# Patient Record
Sex: Female | Born: 1940 | Race: White | Hispanic: No | Smoking: Never smoker
Health system: Southern US, Community
[De-identification: ages and names within clinical notes are randomized; demographics above are authoritative.]

## PROBLEM LIST (undated history)

## (undated) DIAGNOSIS — E785 Hyperlipidemia, unspecified: Secondary | ICD-10-CM

## (undated) DIAGNOSIS — I10 Essential (primary) hypertension: Secondary | ICD-10-CM

## (undated) DIAGNOSIS — I252 Old myocardial infarction: Secondary | ICD-10-CM

## (undated) DIAGNOSIS — C9110 Chronic lymphocytic leukemia of B-cell type not having achieved remission: Secondary | ICD-10-CM

## (undated) DIAGNOSIS — M109 Gout, unspecified: Secondary | ICD-10-CM

## (undated) DIAGNOSIS — M1990 Unspecified osteoarthritis, unspecified site: Secondary | ICD-10-CM

## (undated) DIAGNOSIS — K219 Gastro-esophageal reflux disease without esophagitis: Secondary | ICD-10-CM

## (undated) DIAGNOSIS — R0600 Dyspnea, unspecified: Secondary | ICD-10-CM

## (undated) DIAGNOSIS — F419 Anxiety disorder, unspecified: Secondary | ICD-10-CM

## (undated) DIAGNOSIS — C8590 Non-Hodgkin lymphoma, unspecified, unspecified site: Secondary | ICD-10-CM

## (undated) DIAGNOSIS — T7840XA Allergy, unspecified, initial encounter: Secondary | ICD-10-CM

## (undated) HISTORY — DX: Gastro-esophageal reflux disease without esophagitis: K21.9

## (undated) HISTORY — DX: Old myocardial infarction: I25.2

## (undated) HISTORY — DX: Allergy, unspecified, initial encounter: T78.40XA

## (undated) HISTORY — DX: Essential (primary) hypertension: I10

## (undated) HISTORY — DX: Hyperlipidemia, unspecified: E78.5

## (undated) HISTORY — PX: MOHS SURGERY: SHX181

## (undated) HISTORY — PX: ABDOMINAL HYSTERECTOMY: SHX81

## (undated) HISTORY — PX: APPENDECTOMY: SHX54

## (undated) HISTORY — DX: Gout, unspecified: M10.9

## (undated) HISTORY — DX: Non-Hodgkin lymphoma, unspecified, unspecified site: C85.90

## (undated) HISTORY — DX: Chronic lymphocytic leukemia of B-cell type not having achieved remission: C91.10

## (undated) HISTORY — PX: OTHER SURGICAL HISTORY: SHX169

## (undated) HISTORY — PX: TONSILECTOMY, ADENOIDECTOMY, BILATERAL MYRINGOTOMY AND TUBES: SHX2538

## (undated) HISTORY — PX: KIDNEY SURGERY: SHX687

## (undated) HISTORY — PX: CHOLECYSTECTOMY: SHX55

---

## 2019-06-04 ENCOUNTER — Ambulatory Visit: Payer: Self-pay | Admitting: Internal Medicine

## 2019-10-02 NOTE — Progress Notes (Signed)
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 No treatment pursued at that time.  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  4) 06/14/15-received R-Benda, but d/c due to poor tolerance 5) 05/06/18-06/26/2018: ibrutinib therapy, d/c due to poor tolerance 6) 09/02/2019: last visit with Dr. Phillip Heal at the [facility] clinic in [place], [place].  6) 10/03/2019: establish care with Dr. 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 female with medical history significant for CLL who presents as a referral from [facility] in [place], [place].   On review of the previous records Ms. [name] has a history of breast cancer diagnosed in November 2005.  She had a mastectomy performed with adjuvant AC chemotherapy administered.  She continued on Arimidex x5 years and completed that adjuvant therapy in April 2011.  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  No treatment was pursued at that time.  In September 2015 the patient had a transition of the diagnosis to CLL with a follicular component.  In 2017 the patient began experiencing B symptoms and increasing size of lymph nodes.  In April 2017 she attempted to receive R. Benda, but had to discontinue after 1 cycle due to poor tolerance.   Later in March 2020 the patient was started on ibrutinib therapy but once again this was discontinued due to poor tolerance.  She was recently seen by Dr. Phillip Heal at the [facility] hematology oncology clinic in [place] on 09/02/2019.  After moving to [facility] the patient was referred to hematology and oncology here at the [facility] health cancer center for further evaluation and management.  On exam today Mabe is accompanied by her daughter.  She reports that she has recently moved from [place] to [place].  She was previously cared for by an oncology group in [place], and then when she moved here she had her care transferred to a cancer center in [place].  Due to difficulties with the commute she has requested to establish with a local oncologist.  On discussion today she reports that she has chronic pain in her hips from osteoarthritis and that she has symptoms of chronic fatigue.  She is not able to walk very long distances and can typically only transfer from room to room within the house.  She notes that she is not having any issues with fevers, chills, sweats, nausea, vomiting or diarrhea.  Her weight has been stable.  She reports that she might have decreased appetite, but is otherwise quite well.  A full 10 point ROS is listed below.  MEDICAL HISTORY:  Past Medical History:  Diagnosis Date  . CLL (chronic lymphocytic leukemia) ([place])   . Gout   . HLD (hyperlipidemia)     SURGICAL HISTORY: History reviewed. No pertinent surgical history.  SOCIAL HISTORY: Social History   Socioeconomic History  . Marital  status: Single    Spouse name: Not on file  . Number of children: Not on file  . Years of education: Not on file  . Highest education level: Not on file  Occupational History  . Not on file  Tobacco Use  . Smoking status: Never Smoker  . Smokeless tobacco: Never Used  Substance and Sexual Activity  . Alcohol use: Not Currently    Comment: Rare social drinking  . Drug  use: Never  . Sexual activity: Not on file  Other Topics Concern  . Not on file  Social History Narrative  . Not on file   Social Determinants of Health   Financial Resource Strain:   . Difficulty of Paying Living Expenses:   Food Insecurity:   . Worried About Charity fundraiser in the Last Year:   . Arboriculturist in the Last Year:   Transportation Needs:   . Film/video editor (Medical):   Marland Kitchen Lack of Transportation (Non-Medical):   Physical Activity:   . Days of Exercise per Week:   . Minutes of Exercise per Session:   Stress:   . Feeling of Stress :   Social Connections:   . Frequency of Communication with Friends and Family:   . Frequency of Social Gatherings with Friends and Family:   . Attends Religious Services:   . Active Member of Clubs or Organizations:   . Attends Archivist Meetings:   Marland Kitchen Marital Status:   Intimate Partner Violence:   . Fear of Current or Ex-Partner:   . Emotionally Abused:   Marland Kitchen Physically Abused:   . Sexually Abused:     FAMILY HISTORY: Family History  Problem Relation Age of Onset  . Colon cancer Mother   . Heart attack Father     ALLERGIES:  is allergic to amoxicillin, drug [tape], atenolol, and macrodantin [nitrofurantoin].  MEDICATIONS:  Current Outpatient Medications  Medication Sig Dispense Refill  . acetaminophen (TYLENOL) 500 MG tablet Take 500 mg by mouth every 6 (six) hours as needed.    Marland Kitchen allopurinol (ZYLOPRIM) 100 MG tablet Take 100 mg by mouth daily. 1/2 tablet daily    . busPIRone (BUSPAR) 15 MG tablet Take 15 mg by mouth 2 (two) times daily.    . Cranberry Extract 250 MG TABS Take 500 mg by mouth.    . ezetimibe (ZETIA) 10 MG tablet Take 10 mg by mouth daily.    Marland Kitchen glucosamine-chondroitin 500-400 MG tablet Take 1 tablet by mouth daily.    Marland Kitchen guaifenesin (HUMIBID E) 400 MG TABS tablet Take 400 mg by mouth every 4 (four) hours.    . hydrochlorothiazide (MICROZIDE) 12.5 MG capsule Take 12.5 mg by mouth daily.     Marland Kitchen ibuprofen (ADVIL) 200 MG tablet Take 400 mg by mouth 3 (three) times daily.    . lansoprazole (PREVACID) 30 MG capsule Take 30 mg by mouth daily at 12 noon.    . loperamide (IMODIUM) 2 MG capsule Take by mouth as needed for diarrhea or loose stools.    Marland Kitchen loratadine (CLARITIN) 10 MG tablet Take 10 mg by mouth daily as needed for allergies.    Marland Kitchen LORazepam (ATIVAN) 1 MG tablet Take 1 mg by mouth 2 (two) times daily.    Marland Kitchen losartan (COZAAR) 50 MG tablet Take 50 mg by mouth daily.    . Melatonin 5 MG CHEW Chew by mouth.    . metoprolol tartrate (LOPRESSOR) 25 MG tablet Take 25 mg by mouth  2 (two) times daily.    . Multiple Vitamins-Minerals (EQ MULTIVITAMINS ADULT GUMMY) CHEW Chew by mouth.    . phenazopyridine (PYRIDIUM) 95 MG tablet Take 95 mg by mouth 3 (three) times daily as needed for pain.    . potassium chloride (KLOR-CON) 10 MEQ tablet Take 10 mEq by mouth daily.    Marland Kitchen venlafaxine XR (EFFEXOR-XR) 150 MG 24 hr capsule Take 150 mg by mouth daily with breakfast.     No current facility-administered medications for this visit.    REVIEW OF SYSTEMS:   Constitutional: ( - ) fevers, ( - )  chills , ( - ) night sweats Eyes: ( - ) blurriness of vision, ( - ) double vision, ( - ) watery eyes Ears, nose, mouth, throat, and face: ( - ) mucositis, ( - ) sore throat Respiratory: ( - ) cough, ( - ) dyspnea, ( - ) wheezes Cardiovascular: ( - ) palpitation, ( - ) chest discomfort, ( - ) lower extremity swelling Gastrointestinal:  ( - ) nausea, ( - ) heartburn, ( - ) change in bowel habits Skin: ( - ) abnormal skin rashes Lymphatics: ( - ) new lymphadenopathy, ( - ) easy bruising Neurological: ( - ) numbness, ( - ) tingling, ( - ) new weaknesses Behavioral/Psych: ( - ) mood change, ( - ) new changes  All other systems were reviewed with the patient and are negative.  PHYSICAL EXAMINATION: ECOG PERFORMANCE STATUS: 3 - Symptomatic, >50% confined to bed  Vitals:   10/03/19 0919 10/03/19 0920  BP:  (!) 80/53 (!) 80/57  Pulse: 62   Resp: 17   Temp: (!) 97.1 F (36.2 C)   SpO2: 100%    Filed Weights   10/03/19 0919  Weight: 169 lb 12.8 oz (77 kg)    GENERAL: chronically ill appearing elderly Caucasian female in NAD  SKIN: skin color, texture, turgor are normal, no rashes or significant lesions EYES: conjunctiva are pink and non-injected, sclera clear LUNGS: clear to auscultation and percussion with normal breathing effort HEART: regular rate & rhythm and no murmurs and no lower extremity edema ABDOMEN: soft, non-tender, non-distended, normal bowel sounds Musculoskeletal: no cyanosis of digits and no clubbing  PSYCH: alert & oriented x 3, fluent speech NEURO: no focal motor/sensory deficits  LABORATORY DATA:  I have reviewed the data as listed CBC Latest Ref Rng & Units 10/03/2019  WBC 4.0 - 10.5 K/uL 25.6(H)  Hemoglobin 12.0 - 15.0 g/dL 10.5(L)  Hematocrit 36 - 46 % 33.1(L)  Platelets 150 - 400 K/uL 200    CMP Latest Ref Rng & Units 10/03/2019  Glucose 70 - 99 mg/dL 119(H)  BUN 8 - 23 mg/dL 44(H)  Creatinine 0.44 - 1.00 mg/dL 1.71(H)  Sodium 135 - 145 mmol/L 138  Potassium 3.5 - 5.1 mmol/L 3.4(L)  Chloride 98 - 111 mmol/L 108  CO2 22 - 32 mmol/L 18(L)  Calcium 8.9 - 10.3 mg/dL 9.5  Total Protein 6.5 - 8.1 g/dL 5.6(L)  Total Bilirubin 0.3 - 1.2 mg/dL 0.5  Alkaline Phos 38 - 126 U/L 159(H)  AST 15 - 41 U/L 20  ALT 0 - 44 U/L 12     PATHOLOGY:      RADIOGRAPHIC STUDIES: No results found.  ASSESSMENT & PLAN Laurine Kuyper 79 y.o. female with medical history significant for CLL who presents as a referral from [facility] in [place], [place].  Review the outside records, review the labs, discussion with the patient the findings are  most consistent with CLL.  The stage at this time is not entirely clear, though the patient was previously attempted on therapy x2 with R-Benda and which she is not able to tolerate and ibruitinib which she is a was only able  to take for approximately 6 weeks before she began having symptoms that require discontinuation.  At this time the patient notes that she wants to transfer care as she recently moved from [facility].  As such we are happy to become her new primary oncologist and monitor her CLL.  We are in the process of obtaining additional records from [facility] in order to help assure that we know the full history of her CLL including staging and full work-up.  We do have flow cytometry results that help [place] confirm the diagnosis at this time.  Overall it appears as though she has been on observation with brief attempts at therapy before in the past.  Assuming her CLL is stable we will plan to see her back in approximately 3 months time in order to reevaluate.  #Chronic Lymphocytic Leukemia, Staging in Process --based on prior labs and studies the patient has a confirmed diagnosis of CLL. The stage is not clear based on the documentation. Will clarify this by obtaining prior records from [place]. --will order CBC, CMP, LDH for baseline here at [facility] --patient has attempted prior therapy with ibrutinib and R-Benda but not been able to tolerate thearpy.  --no record of lymphadenopathy or splenomegaly --RTC in 3 months or sooner if treatment is indicated/more evaluation is required.   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 --continue yearly mammograms   Orders Placed This Encounter  Procedures  . CBC with Differential (Cancer Center Only)    Standing Status:   Future    Number of Occurrences:   1    Standing Expiration Date:   10/02/2020  . CMP ([place] only)    Standing Status:   Future    Number of Occurrences:   1    Standing Expiration Date:   10/02/2020  . Lactate dehydrogenase (LDH)    Standing Status:   Future    Number of Occurrences:   1      Standing Expiration Date:   10/02/2020  . Save Smear (SSMR)    Standing Status:   Future    Number of Occurrences:   1    Standing Expiration Date:   10/02/2020  . QIG  (Quant. immunoglobulins  - IgG, IgA, IgM)    Standing Status:   Future    Number of Occurrences:   1    Standing Expiration Date:   10/02/2020  . Uric acid    Standing Status:   Future    Number of Occurrences:   1    Standing Expiration Date:   10/02/2020  . Ambulatory referral to Internal Medicine    Referral Priority:   Routine    Referral Type:   Consultation    Referral Reason:   Specialty Services Required    Requested Specialty:   Internal Medicine    Number of Visits Requested:   1    All questions were answered. The patient knows to call the clinic with any problems, questions or concerns.  A total of more than 60 minutes were spent on this encounter and over half of that time was spent on  counseling and coordination of care as outlined above.   Ledell Peoples, MD Department of Hematology/Oncology [place] [facility] Phone: [phone] Pager: [phone] Email: [email or web address]  10/03/2019 1:35 PM

## 2019-10-03 ENCOUNTER — Other Ambulatory Visit: Payer: Self-pay

## 2019-10-03 ENCOUNTER — Inpatient Hospital Stay (HOSPITAL_BASED_OUTPATIENT_CLINIC_OR_DEPARTMENT_OTHER): Payer: Medicare Other | Admitting: Hematology and Oncology

## 2019-10-03 ENCOUNTER — Encounter: Payer: Self-pay | Admitting: Hematology and Oncology

## 2019-10-03 ENCOUNTER — Inpatient Hospital Stay: Payer: Medicare Other | Attending: Hematology and Oncology

## 2019-10-03 VITALS — BP 80/57 | HR 62 | Temp 97.1°F | Resp 17 | Ht 63.0 in | Wt 169.8 lb

## 2019-10-03 DIAGNOSIS — Z856 Personal history of leukemia: Secondary | ICD-10-CM | POA: Diagnosis present

## 2019-10-03 DIAGNOSIS — Z0000 Encounter for general adult medical examination without abnormal findings: Secondary | ICD-10-CM | POA: Diagnosis not present

## 2019-10-03 DIAGNOSIS — C9110 Chronic lymphocytic leukemia of B-cell type not having achieved remission: Secondary | ICD-10-CM | POA: Diagnosis not present

## 2019-10-03 DIAGNOSIS — Z170 Estrogen receptor positive status [ER+]: Secondary | ICD-10-CM | POA: Diagnosis not present

## 2019-10-03 DIAGNOSIS — Z853 Personal history of malignant neoplasm of breast: Secondary | ICD-10-CM | POA: Diagnosis not present

## 2019-10-03 LAB — CBC WITH DIFFERENTIAL (CANCER CENTER ONLY)
Abs Immature Granulocytes: 0.13 10*3/uL — ABNORMAL HIGH (ref 0.00–0.07)
Basophils Absolute: 0.1 10*3/uL (ref 0.0–0.1)
Basophils Relative: 0 %
Eosinophils Absolute: 0.4 10*3/uL (ref 0.0–0.5)
Eosinophils Relative: 2 %
HCT: 33.1 % — ABNORMAL LOW (ref 36.0–46.0)
Hemoglobin: 10.5 g/dL — ABNORMAL LOW (ref 12.0–15.0)
Immature Granulocytes: 1 %
Lymphocytes Relative: 90 %
Lymphs Abs: 23.3 10*3/uL — ABNORMAL HIGH (ref 0.7–4.0)
MCH: 33.3 pg (ref 26.0–34.0)
MCHC: 31.7 g/dL (ref 30.0–36.0)
MCV: 105.1 fL — ABNORMAL HIGH (ref 80.0–100.0)
Monocytes Absolute: 0.4 10*3/uL (ref 0.1–1.0)
Monocytes Relative: 2 %
Neutro Abs: 1.2 10*3/uL — ABNORMAL LOW (ref 1.7–7.7)
Neutrophils Relative %: 5 %
Platelet Count: 200 10*3/uL (ref 150–400)
RBC: 3.15 MIL/uL — ABNORMAL LOW (ref 3.87–5.11)
RDW: 14.9 % (ref 11.5–15.5)
WBC Count: 25.6 10*3/uL — ABNORMAL HIGH (ref 4.0–10.5)
nRBC: 0 % (ref 0.0–0.2)

## 2019-10-03 LAB — CMP (CANCER CENTER ONLY)
ALT: 12 U/L (ref 0–44)
AST: 20 U/L (ref 15–41)
Albumin: 3.1 g/dL — ABNORMAL LOW (ref 3.5–5.0)
Alkaline Phosphatase: 159 U/L — ABNORMAL HIGH (ref 38–126)
Anion gap: 12 (ref 5–15)
BUN: 44 mg/dL — ABNORMAL HIGH (ref 8–23)
CO2: 18 mmol/L — ABNORMAL LOW (ref 22–32)
Calcium: 9.5 mg/dL (ref 8.9–10.3)
Chloride: 108 mmol/L (ref 98–111)
Creatinine: 1.71 mg/dL — ABNORMAL HIGH (ref 0.44–1.00)
GFR, Est AFR Am: 32 mL/min — ABNORMAL LOW (ref >60)
GFR, Estimated: 28 mL/min — ABNORMAL LOW (ref >60)
Glucose, Bld: 119 mg/dL — ABNORMAL HIGH (ref 70–99)
Potassium: 3.4 mmol/L — ABNORMAL LOW (ref 3.5–5.1)
Sodium: 138 mmol/L (ref 135–145)
Total Bilirubin: 0.5 mg/dL (ref 0.3–1.2)
Total Protein: 5.6 g/dL — ABNORMAL LOW (ref 6.5–8.1)

## 2019-10-03 LAB — SAVE SMEAR(SSMR), FOR PROVIDER SLIDE REVIEW

## 2019-10-03 LAB — LACTATE DEHYDROGENASE: LDH: 199 U/L — ABNORMAL HIGH (ref 98–192)

## 2019-10-03 LAB — URIC ACID: Uric Acid, Serum: 7 mg/dL (ref 2.5–7.1)

## 2019-10-04 LAB — IGG, IGA, IGM
IgA: <5 mg/dL — ABNORMAL LOW (ref 64–422)
IgG (Immunoglobin G), Serum: <30 mg/dL — ABNORMAL LOW (ref 586–1602)
IgM (Immunoglobulin M), Srm: <5 mg/dL — ABNORMAL LOW (ref 26–217)

## 2019-10-06 ENCOUNTER — Telehealth: Payer: Self-pay | Admitting: Hematology and Oncology

## 2019-10-06 NOTE — Telephone Encounter (Signed)
Scheduled per los. Called and spoke with patients daughter. Confirmed appt  °

## 2019-10-10 ENCOUNTER — Telehealth: Payer: Self-pay | Admitting: Internal Medicine

## 2019-10-10 NOTE — Telephone Encounter (Signed)
Called daughter, [name], to schedule the Palliative Consult - no answer, message left with reason for call along with my name and contact number.

## 2019-10-21 ENCOUNTER — Other Ambulatory Visit: Payer: Self-pay

## 2019-10-21 ENCOUNTER — Other Ambulatory Visit: Payer: Medicare Other | Admitting: Internal Medicine

## 2019-10-21 DIAGNOSIS — Z515 Encounter for palliative care: Secondary | ICD-10-CM

## 2019-10-21 DIAGNOSIS — C9110 Chronic lymphocytic leukemia of B-cell type not having achieved remission: Secondary | ICD-10-CM

## 2019-10-21 DIAGNOSIS — R531 Weakness: Secondary | ICD-10-CM

## 2019-10-22 ENCOUNTER — Encounter: Payer: Self-pay | Admitting: Internal Medicine

## 2019-10-22 NOTE — Progress Notes (Signed)
[place] Consult Note Telephone: [phone]  Fax: [phone]  PATIENT NAME: Sarah [name] DOB: 09-01-1940 MRN: [id number]  PRIMARY CARE PROVIDER:   Phillips Climes, MD  REFERRING PROVIDER:  Phillips Climes, MD [address] [place],  [place] [address]  RESPONSIBLE PARTY:   Self  Emergency contact/POA:   Mardene Sayer Daughter   [phone]        RECOMMENDATIONS and PLAN: Palliative care encounter  Z51.5   1.  Advance care planning:  Reviewed palliative and hospice care services.  Goals of care per patient are to improve strength, ambulation and stabilize her overall health.  She would like to avoid hospitalizations.  She is still interested in receiving therapies to aid in improving strength and CLL when recommended.  Advanced directives were reviewed with pt. and her daughter.  MOST form was reviewed and additional selections in addition to DNAR that was previously selected, will be re-addressed during the next visit.  DNAR document was uploaded to [facility]. Palliative care will follow-up with patient in aprox 3 weeks.  2.  Weakness:  Multifactorial and requires assistance with personal care.  Consider initiation of home physical therapy, home health aid to improve conditioning and mobility/stamina.  Daughter will investigate private caregiver providers.   3.  Chronic back and leg pain:  Discontinue use of Advil or any NSAIDs due to chronic kidney disease(per lab review).  Tylenol 1000mg  every 8 hours prn pain.  Trial of Gabapentin 100mg  at bedtime(home supply).   4.  Chronic lymphocytic leukemia:  Keep f/u management appointment with oncologist( Dr. Flora Lipps).  Establish with new PCP in October (Dr. Abelino Derrick).   5. Urinary incontinence: Decrease oral hydration after 7 pm.  Consider use of Pure wick collection system during sleep hours.  Rx for same left with patient who will investigate with insurance carrier.    I spent 90 minutes providing this consultation,  from 1330 to 1500. More than 50% of the time in this consultation was spent coordinating communication with patient and her daughter   HISTORY OF PRESENT ILLNESS:  Brad Mcgaughy is a 79 y.o. 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  Pt reports significant weakness with inability to perform her own ADLs.  She has relocated to [place] at her daughter's home from [place], [place] due to need for assistance with her personal and healthcare.  She reports that she spends much of her day and night in a recliner, requires a walker to assist with ambulation and is incontinent of urine.  She is unable to have treatments for CLL at this time.  Reports significant low back pain with radiation into R hip.  Pain ranges from a 4-8/10 and is managed with use of Advil 400mg  and Tylenol 500-1000mg  3 times per day.  Pertinent lab reviews from 10/03/19 noteWBC of 25.6,  BUN/CREA of 44/1.7.  GFR is decreased at 26. No noted mention of CKD but pt "had surgery on her kidney after childbirth".  Reports frequent urinary incontinence that requires use of adult briefs.  Daughter is her primary caregiver and has health issues of her own. Palliative Care was asked to help address goals of care.   CODE STATUS: DNR  PPS: 40%  weak HOSPICE ELIGIBILITY/DIAGNOSIS: TBD  PAST MEDICAL HISTORY:  Past Medical History:  Diagnosis Date  . CLL (chronic lymphocytic leukemia) ([place])   . Gout   . HLD  (hyperlipidemia)     SOCIAL HX: Lives with daughter and son in law   ALLERGIES:  Allergies  Allergen Reactions  . Amoxicillin   . Drug [Tape]   . Atenolol Rash  . Macrodantin [Nitrofurantoin] Rash     PERTINENT MEDICATIONS:  Outpatient Encounter Medications as of 10/21/2019  Medication Sig  . acetaminophen (TYLENOL) 500 MG tablet Take 500 mg by mouth every 6 (six) hours as needed.  Marland Kitchen allopurinol (ZYLOPRIM) 100 MG tablet Take 100 mg by mouth daily. 1/2 tablet daily  . busPIRone (BUSPAR) 15 MG tablet Take 15 mg by mouth 2 (two) times daily.  . Cranberry Extract 250 MG TABS Take 500 mg by mouth.  . ezetimibe (ZETIA) 10 MG tablet Take 10 mg by mouth daily.  Marland Kitchen glucosamine-chondroitin 500-400 MG tablet Take 1 tablet by mouth daily.  Marland Kitchen guaifenesin (HUMIBID E) 400 MG TABS tablet Take 400 mg by mouth every 4 (four) hours.  . hydrochlorothiazide (MICROZIDE) 12.5 MG capsule Take 12.5 mg by mouth daily.  Marland Kitchen ibuprofen (ADVIL) 200 MG tablet Take 400 mg by mouth 3 (three) times daily.  . lansoprazole (PREVACID) 30 MG capsule Take 30 mg by mouth daily at 12 noon.  . loperamide (IMODIUM) 2 MG capsule Take by mouth as needed for diarrhea or loose stools.  Marland Kitchen loratadine (CLARITIN) 10 MG tablet Take 10 mg by mouth daily as needed for allergies.  Marland Kitchen LORazepam (ATIVAN) 1 MG tablet Take 1 mg by mouth 2 (two) times daily.  Marland Kitchen losartan (COZAAR) 50 MG tablet Take 50 mg by mouth daily.  . Melatonin 5 MG CHEW Chew by mouth.  . metoprolol tartrate (LOPRESSOR) 25 MG tablet Take 25 mg by mouth 2 (two) times daily.  . Multiple Vitamins-Minerals (EQ MULTIVITAMINS ADULT GUMMY) CHEW Chew by mouth.  . phenazopyridine (PYRIDIUM) 95 MG tablet Take 95 mg by mouth 3 (three) times daily as needed for pain.  . potassium chloride (KLOR-CON) 10 MEQ tablet Take 10 mEq by mouth daily.  Marland Kitchen venlafaxine XR (EFFEXOR-XR) 150 MG 24 hr capsule Take 150 mg by mouth daily with breakfast.   No facility-administered encounter medications on  file as of 10/21/2019.    PHYSICAL EXAM:   General: NAD, chronically ill appearing elderly female reclining in a recliner Cardiovascular: regular rate and rhythm Pulmonary: clear ant fields Abdomen: soft, nontender, + bowel sounds GU: no suprapubic tenderness Extremities: no edema, decreased muscle tone Skin: exposed skin is intact Neurological: A&O x3 weakness, speech is fluid.  Rollator available for use with ambulation.  Gonzella Lex, NP-C

## 2019-10-23 ENCOUNTER — Telehealth: Payer: Self-pay | Admitting: Internal Medicine

## 2019-10-23 DIAGNOSIS — R531 Weakness: Secondary | ICD-10-CM

## 2019-10-23 DIAGNOSIS — Z515 Encounter for palliative care: Secondary | ICD-10-CM

## 2019-10-23 NOTE — Telephone Encounter (Signed)
See attached telephone document.  Gonzella Lex, NP-C

## 2019-11-06 ENCOUNTER — Ambulatory Visit: Payer: Medicare Other | Admitting: Family Medicine

## 2019-11-21 ENCOUNTER — Other Ambulatory Visit: Payer: Medicare Other | Admitting: Internal Medicine

## 2019-11-21 DIAGNOSIS — Z515 Encounter for palliative care: Secondary | ICD-10-CM

## 2019-11-21 DIAGNOSIS — M79604 Pain in right leg: Secondary | ICD-10-CM

## 2019-11-23 NOTE — Progress Notes (Signed)
[facility] Consult Note Telephone: [phone]  Fax: [phone]  PATIENT NAME: Sarah [name] DOB: 1940/11/05 MRN: [id number]  PRIMARY CARE PROVIDER:   Phillips Climes, MD  REFERRING PROVIDER:  Phillips Climes, MD [address] [place],  [place] [address]  RESPONSIBLE PARTY:   Self  Emergency contact/POA:   Mardene Sayer Daughter   [phone]        RECOMMENDATIONS and PLAN: Palliative care encounter  Z51.5   1.  Advance care planning:  DNAR on file  Goals remain for improvement of strength and ambulation.   She would like to avoid hospitalizations. . Palliative care will follow-up with patient in aprox 4-6 weeks.  2.  Weakness: Improved since discontinuance of HCTZ and Losartan.  Pt will begin self directed exercises/Tai Chi at this time.  Re-consider home PT if additional assistance is required.  Daughter will investigate home care benefits and private caregivers.    3.  Chronic back and leg pain: Unchanged after use of Gabapentin x2 weeks and has resumed use of Ibuprofen 868m in divided doses  Again cautioned against use of NSAIDs.  Gabapentin could be increased to 2086mat HS Tylenol 100076mvery 8 hours prn pain.  Monitor renal function with repeat comprehnesive metabolic panel.  4.  Chronic lymphocytic leukemia:  Keep pending appointment with oncologist( Dr. JohFlora Lipps Establish with new PCP in October (Dr. WilAbelino Derrick  5. Urinary incontinence: Improved since d/c of HCTZ.  Continue to limit evening hydration.   I spent 40 minutes providing this consultation,  from 1130 to 1210. More than 50% of the time in this consultation was spent coordinating communication with patient and her daughter   HISTORY OF PRESENT ILLNESS:  Follow-up with Sarah [name] [age]o. 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 She  reports feeling stronger and less dizzy since HCTZ and Losartan have been discontinued.  No reports of falls. She also reports improved sleep with use of Gabapentin however, she was still awakened due to leg pain. Improved pain with use of modified doses of Ibuprofen and Tylenol.  Palliative Care was asked to help address goals of care.   CODE STATUS: DNR  PPS: 40%  weak HOSPICE ELIGIBILITY/DIAGNOSIS: TBD  PAST MEDICAL HISTORY:  Past Medical History:  Diagnosis Date  . CLL (chronic lymphocytic leukemia) ([facility])   . Gout   . HLD (hyperlipidemia)     SOCIAL HX: Lives with daughter and son in law   ALLERGIES:  Allergies  Allergen Reactions  . Amoxicillin   . Drug [Tape]   . Atenolol Rash  . Macrodantin [Nitrofurantoin] Rash     PERTINENT MEDICATIONS:  Outpatient Encounter Medications as of 10/21/2019  Medication Sig  . acetaminophen (TYLENOL) 500 MG tablet Take 500 mg by mouth every 6 (six) hours as needed.  Marland Kitchen allopurinol (ZYLOPRIM) 100 MG tablet Take 100 mg by mouth daily. 1/2 tablet daily  . busPIRone (BUSPAR) 15 MG tablet Take 15 mg by mouth 2 (two) times daily.  . Cranberry Extract 250 MG TABS Take 500 mg by mouth.  . ezetimibe (ZETIA) 10 MG tablet Take 10 mg by mouth daily.  Marland Kitchen glucosamine-chondroitin 500-400 MG tablet Take 1 tablet by mouth daily.  Marland Kitchen guaifenesin (HUMIBID E) 400 MG TABS tablet Take 400 mg by mouth every 4 (four) hours.  . hydrochlorothiazide (MICROZIDE) 12.5 MG capsule Take 12.5 mg by mouth daily.  Marland Kitchen ibuprofen (ADVIL) 200 MG tablet Take 400 mg by mouth 3 (three) times daily.   . lansoprazole (PREVACID) 30 MG capsule Take 30 mg by mouth daily at 12 noon.  . loperamide (IMODIUM) 2 MG capsule Take by mouth as needed for diarrhea or loose stools.  Marland Kitchen loratadine (CLARITIN) 10 MG tablet Take 10 mg by mouth daily as needed for allergies.  Marland Kitchen LORazepam (ATIVAN) 1 MG tablet Take 1 mg by mouth 2 (two) times daily.  Marland Kitchen losartan (COZAAR) 50 MG tablet Take 50 mg by mouth daily.  . Melatonin 5 MG CHEW Chew by mouth.  . metoprolol tartrate (LOPRESSOR) 25 MG tablet Take 25 mg by mouth 2 (two) times daily.  . Multiple Vitamins-Minerals (EQ MULTIVITAMINS ADULT GUMMY) CHEW Chew by mouth.  . phenazopyridine (PYRIDIUM) 95 MG tablet Take 95 mg by mouth 3 (three) times daily as needed for pain.  . potassium chloride (KLOR-CON) 10 MEQ tablet Take 10 mEq by mouth daily.  Marland Kitchen venlafaxine XR (EFFEXOR-XR) 150 MG 24 hr capsule Take 150 mg by mouth daily with breakfast.   No facility-administered encounter medications on file as of 10/21/2019.    PHYSICAL EXAM:   General: NAD, chronically ill appearing elderly female sitting in a recliner Cardiovascular: regular rate and rhythm Pulmonary: rales of LLL, clear otherwise Abdomen: soft, nontender, + bowel sounds Extremities: no edema, decreased muscle tone Skin: exposed skin is intact Neurological: A&O x3 weakness, speech is fluid.    Gonzella Lex, NP-C

## 2019-11-24 ENCOUNTER — Other Ambulatory Visit: Payer: Self-pay

## 2019-12-08 ENCOUNTER — Encounter: Payer: Self-pay | Admitting: Family Medicine

## 2019-12-08 ENCOUNTER — Ambulatory Visit (INDEPENDENT_AMBULATORY_CARE_PROVIDER_SITE_OTHER): Payer: Medicare Other | Admitting: Family Medicine

## 2019-12-08 ENCOUNTER — Ambulatory Visit: Payer: Medicare Other | Admitting: Family Medicine

## 2019-12-08 ENCOUNTER — Other Ambulatory Visit: Payer: Self-pay

## 2019-12-08 ENCOUNTER — Telehealth: Payer: Self-pay

## 2019-12-08 VITALS — BP 120/84 | HR 56 | Temp 97.0°F | Ht 62.5 in | Wt 165.0 lb

## 2019-12-08 DIAGNOSIS — N3001 Acute cystitis with hematuria: Secondary | ICD-10-CM

## 2019-12-08 DIAGNOSIS — R350 Frequency of micturition: Secondary | ICD-10-CM | POA: Insufficient documentation

## 2019-12-08 DIAGNOSIS — E7800 Pure hypercholesterolemia, unspecified: Secondary | ICD-10-CM

## 2019-12-08 DIAGNOSIS — Z8739 Personal history of other diseases of the musculoskeletal system and connective tissue: Secondary | ICD-10-CM

## 2019-12-08 DIAGNOSIS — C9110 Chronic lymphocytic leukemia of B-cell type not having achieved remission: Secondary | ICD-10-CM

## 2019-12-08 DIAGNOSIS — I252 Old myocardial infarction: Secondary | ICD-10-CM

## 2019-12-08 DIAGNOSIS — J301 Allergic rhinitis due to pollen: Secondary | ICD-10-CM | POA: Insufficient documentation

## 2019-12-08 DIAGNOSIS — D539 Nutritional anemia, unspecified: Secondary | ICD-10-CM

## 2019-12-08 HISTORY — DX: Personal history of other diseases of the musculoskeletal system and connective tissue: Z87.39

## 2019-12-08 HISTORY — DX: Frequency of micturition: R35.0

## 2019-12-08 HISTORY — DX: Pure hypercholesterolemia, unspecified: E78.00

## 2019-12-08 HISTORY — DX: Allergic rhinitis due to pollen: J30.1

## 2019-12-08 HISTORY — DX: Old myocardial infarction: I25.2

## 2019-12-08 LAB — CBC
HCT: 38.9 % (ref 36.0–46.0)
Hemoglobin: 12.3 g/dL (ref 12.0–15.0)
MCHC: 31.6 g/dL (ref 30.0–36.0)
MCV: 105 fl — ABNORMAL HIGH (ref 78.0–100.0)
Platelets: 234 10*3/uL (ref 150.0–400.0)
RBC: 3.71 Mil/uL — ABNORMAL LOW (ref 3.87–5.11)
RDW: 16.3 % — ABNORMAL HIGH (ref 11.5–15.5)
WBC: 60.4 10*3/uL (ref 4.0–10.5)

## 2019-12-08 LAB — COMPREHENSIVE METABOLIC PANEL
ALT: 11 U/L (ref 0–35)
AST: 24 U/L (ref 0–37)
Albumin: 3.9 g/dL (ref 3.5–5.2)
Alkaline Phosphatase: 139 U/L — ABNORMAL HIGH (ref 39–117)
BUN: 16 mg/dL (ref 6–23)
CO2: 23 mEq/L (ref 19–32)
Calcium: 9.1 mg/dL (ref 8.4–10.5)
Chloride: 105 mEq/L (ref 96–112)
Creatinine, Ser: 1.1 mg/dL (ref 0.40–1.20)
GFR: 47.38 mL/min — ABNORMAL LOW (ref >60.00)
Glucose, Bld: 93 mg/dL (ref 70–99)
Potassium: 4.3 mEq/L (ref 3.5–5.1)
Sodium: 139 mEq/L (ref 135–145)
Total Bilirubin: 0.5 mg/dL (ref 0.2–1.2)
Total Protein: 5.9 g/dL — ABNORMAL LOW (ref 6.0–8.3)

## 2019-12-08 LAB — URIC ACID: Uric Acid, Serum: 5.3 mg/dL (ref 2.4–7.0)

## 2019-12-08 LAB — B12 AND FOLATE PANEL
Folate: >24.8 ng/mL (ref >5.9)
Vitamin B-12: 372 pg/mL (ref 211–911)

## 2019-12-08 MED ORDER — FLUTICASONE PROPIONATE 50 MCG/ACT NA SUSP: 2.0000 | Freq: Every day | NASAL | 6 refills | Status: AC

## 2019-12-08 NOTE — Telephone Encounter (Signed)
Lab calling in critical lab on patient WBC's at 60.4.Marland KitchenMarland KitchenPlease advise.

## 2019-12-08 NOTE — Progress Notes (Addendum)
Established Patient Office Visit  Subjective:  Patient ID: Sarah [name], female    DOB: Oct 23, 1940  Age: 79 y.o. MRN: [id number]  CC:  Chief Complaint  Patient presents with  . Establish Care    NP- Establish care, having urine frequency x 4-5 days.      HPI Sarah [name] presents for establishment of care. She is chronically ill with CLL, CAD, and CKD. Her uric acid became elevated after treatment for her CLL.  She has experienced a mild attack to her left great toe.  She has been on a small dose of allopurinol 50 mg daily since.  She has not MRI back in 1999.  She is seeing cardiology for follow-up of her cholesterol since that time.  She has had no further chest pain or shortness of breath.  She has been taking Zetia for control of her cholesterol.  She suffers from anxiety and has been taking Effexor with BuSpar for this.  She has allergy rhinitis.  Claritin is effective.  She had taken Flonase in the past and it helped.  She has follow-up with oncology in November.  She is currently also seeing palliative care.  Past Medical History:  Diagnosis Date  . Allergy   . CLL (chronic lymphocytic leukemia) ([facility])   . GERD (gastroesophageal reflux disease)   . Gout   . HLD (hyperlipidemia)   . Hypertension     Past Surgical History:  Procedure Laterality Date  . ABDOMINAL HYSTERECTOMY    . APPENDECTOMY    . CHOLECYSTECTOMY    . KIDNEY SURGERY    . MOHS SURGERY      Family History  Problem Relation Age of Onset  . Colon cancer Mother   . Heart attack Father     Social History   Socioeconomic History  . Marital status: Single    Spouse name: Not on file  . Number of children: Not on file  . Years of education: Not on file  . Highest education level: Not on file  Occupational History  . Not on file  Tobacco Use  . Smoking status: Never Smoker  . Smokeless tobacco: Never Used  Vaping Use  . Vaping Use: Never used  Substance and Sexual Activity  . Alcohol use: Not  Currently    Comment: Rare social drinking  . Drug use: Never  . Sexual activity: Not Currently  Other Topics Concern  . Not on file  Social History Narrative  . Not on file   Social Determinants of Health   Financial Resource Strain:   . Difficulty of Paying Living Expenses: Not on file  Food Insecurity:   . Worried About Charity fundraiser in the Last Year: Not on file  . Ran Out of Food in the Last Year: Not on file  Transportation Needs:   . Lack of Transportation (Medical): Not on file  . Lack of Transportation (Non-Medical): Not on file  Physical Activity:   . Days of Exercise per Week: Not on file  . Minutes of Exercise per Session: Not on file  Stress:   . Feeling of Stress : Not on file  Social Connections:   . Frequency of Communication with Friends and Family: Not on file  . Frequency of Social Gatherings with Friends and Family: Not on file  . Attends Religious Services: Not on file  . Active Member of Clubs or Organizations: Not on file  . Attends Archivist Meetings: Not on file  . Marital  Status: Not on file  Intimate Partner Violence:   . Fear of Current or Ex-Partner: Not on file  . Emotionally Abused: Not on file  . Physically Abused: Not on file  . Sexually Abused: Not on file    Outpatient Medications Prior to Visit  Medication Sig Dispense Refill  . acetaminophen (TYLENOL) 500 MG tablet Take 500 mg by mouth every 6 (six) hours as needed.    Marland Kitchen allopurinol (ZYLOPRIM) 100 MG tablet Take 100 mg by mouth daily. 1/2 tablet daily    . busPIRone (BUSPAR) 15 MG tablet Take 15 mg by mouth 2 (two) times daily.    . Cranberry Extract 250 MG TABS Take 500 mg by mouth.    . ezetimibe (ZETIA) 10 MG tablet Take 10 mg by mouth daily.    Marland Kitchen glucosamine-chondroitin 500-400 MG tablet Take 1 tablet by mouth daily.    Marland Kitchen guaifenesin (HUMIBID E) 400 MG TABS tablet Take 400 mg by mouth every 4 (four) hours.    Marland Kitchen ibuprofen (ADVIL) 200 MG tablet Take 400 mg by mouth  3 (three) times daily.    . lansoprazole (PREVACID) 30 MG capsule Take 30 mg by mouth daily at 12 noon.    . loperamide (IMODIUM) 2 MG capsule Take by mouth as needed for diarrhea or loose stools.    Marland Kitchen loratadine (CLARITIN) 10 MG tablet Take 10 mg by mouth daily as needed for allergies.    . metoprolol tartrate (LOPRESSOR) 25 MG tablet Take 25 mg by mouth 2 (two) times daily.    . Multiple Vitamins-Minerals (EQ MULTIVITAMINS ADULT GUMMY) CHEW Chew by mouth.    . potassium chloride (KLOR-CON) 10 MEQ tablet Take 10 mEq by mouth daily.    Marland Kitchen venlafaxine XR (EFFEXOR-XR) 150 MG 24 hr capsule Take 150 mg by mouth daily with breakfast.    . LORazepam (ATIVAN) 1 MG tablet Take 1 mg by mouth 2 (two) times daily.    . mometasone (NASONEX) 50 MCG/ACT nasal spray Place 2 sprays into the nose daily.    . hydrochlorothiazide (MICROZIDE) 12.5 MG capsule Take 12.5 mg by mouth daily. (Patient not taking: Reported on 12/08/2019)    . losartan (COZAAR) 50 MG tablet Take 50 mg by mouth daily. (Patient not taking: Reported on 12/08/2019)    . Melatonin 5 MG CHEW Chew by mouth. (Patient not taking: Reported on 12/08/2019)    . phenazopyridine (PYRIDIUM) 95 MG tablet Take 95 mg by mouth 3 (three) times daily as needed for pain. (Patient not taking: Reported on 12/08/2019)     No facility-administered medications prior to visit.    Allergies  Allergen Reactions  . Amoxicillin   . Augmentin [Amoxicillin-Pot Clavulanate]     GI upset   . Drug [Tape]   . Sulfa Antibiotics     Childhood   . Atenolol Rash  . Macrodantin [Nitrofurantoin] Rash    ROS Review of Systems  Constitutional: Negative.   HENT: Positive for postnasal drip, rhinorrhea and voice change.   Eyes: Negative for photophobia and visual disturbance.  Respiratory: Negative.  Negative for chest tightness and shortness of breath.   Cardiovascular: Negative for chest pain.  Gastrointestinal: Negative.   Endocrine: Negative for polyphagia and  polyuria.  Genitourinary: Positive for frequency. Negative for dysuria and urgency.  Musculoskeletal: Negative for gait problem and joint swelling.  Skin: Negative for pallor.  Allergic/Immunologic: Negative for immunocompromised state.  Neurological: Negative for light-headedness and numbness.  Hematological: Does not bruise/bleed easily.  Psychiatric/Behavioral: Negative.  Objective:    Physical Exam Vitals and nursing note reviewed.  Constitutional:      General: She is not in acute distress.    Appearance: Normal appearance. She is normal weight. She is ill-appearing. She is not toxic-appearing or diaphoretic.  HENT:     Head: Normocephalic and atraumatic.     Right Ear: External ear normal.     Left Ear: External ear normal.     Mouth/Throat:     Pharynx: Oropharynx is clear. No oropharyngeal exudate or posterior oropharyngeal erythema.  Eyes:     General: No scleral icterus.       Right eye: No discharge.        Left eye: No discharge.     Conjunctiva/sclera: Conjunctivae normal.  Cardiovascular:     Rate and Rhythm: Normal rate and regular rhythm.  Pulmonary:     Effort: Pulmonary effort is normal. No respiratory distress.     Breath sounds: Normal breath sounds. No stridor. No wheezing or rhonchi.  Musculoskeletal:        General: Swelling present.  Skin:    Coloration: Skin is pale.  Neurological:     Mental Status: She is alert and oriented to person, place, and time.  Psychiatric:        Mood and Affect: Mood normal.        Behavior: Behavior normal.     BP 120/84   Pulse (!) 56   Temp (!) 97 F (36.1 C) (Temporal)   Ht 5' 2.5" (1.588 m)   Wt 165 lb (74.8 kg)   SpO2 97%   BMI 29.70 kg/m  Wt Readings from Last 3 Encounters:  12/08/19 165 lb (74.8 kg)  10/03/19 169 lb 12.8 oz (77 kg)     Health Maintenance Due  Topic Date Due  . TETANUS/TDAP  Never done  . DEXA SCAN  Never done  . PNA vac Low Risk Adult (1 of 2 - PCV13) Never done  .  INFLUENZA VACCINE  Never done    There are no preventive care reminders to display for this patient.  No results found for: TSH Lab Results  Component Value Date   WBC 60.4 Repeated and verified X2. (HH) 12/08/2019   HGB 12.3 12/08/2019   HCT 38.9 12/08/2019   MCV 105.0 (H) 12/08/2019   PLT 234.0 12/08/2019   Lab Results  Component Value Date   NA 139 12/08/2019   K 4.3 12/08/2019   CO2 23 12/08/2019   GLUCOSE 93 12/08/2019   BUN 16 12/08/2019   CREATININE 1.10 12/08/2019   BILITOT 0.5 12/08/2019   ALKPHOS 139 (H) 12/08/2019   AST 24 12/08/2019   ALT 11 12/08/2019   PROT 5.9 (L) 12/08/2019   ALBUMIN 3.9 12/08/2019   CALCIUM 9.1 12/08/2019   ANIONGAP 12 10/03/2019   GFR 47.38 (L) 12/08/2019   No results found for: CHOL No results found for: HDL No results found for: LDLCALC No results found for: TRIG No results found for: CHOLHDL No results found for: HGBA1C    Assessment & Plan:   Problem List Items Addressed This Visit      Respiratory   Allergic rhinitis due to pollen   Relevant Medications   fluticasone (FLONASE) 50 MCG/ACT nasal spray     Genitourinary   Acute cystitis with hematuria   Relevant Medications   ciprofloxacin (CIPRO) 500 MG tablet     Other   Urinary frequency   Relevant Orders   Urinalysis, Routine w  reflex microscopic (Completed)   Urine Culture (Completed)   CLL (chronic lymphocytic leukemia) ([facility]) - Primary   Relevant Medications   ciprofloxacin (CIPRO) 500 MG tablet   Other Relevant Orders   CBC (Completed)   History of gout   Relevant Orders   Uric acid (Completed)   Comprehensive metabolic panel (Completed)   Macrocytic anemia   Relevant Orders   B12 and Folate Panel (Completed)      Meds ordered this encounter  Medications  . fluticasone (FLONASE) 50 MCG/ACT nasal spray    Sig: Place 2 sprays into both nostrils daily.    Dispense:  16 g    Refill:  6  . ciprofloxacin (CIPRO) 500 MG tablet    Sig: Take 1 tablet  (500 mg total) by mouth 2 (two) times daily for 7 days.    Dispense:  14 tablet    Refill:  0    Follow-up: Return in about 4 months (around 04/09/2020), or if symptoms worsen or fail to improve.    Libby Maw, MD

## 2019-12-08 NOTE — Addendum Note (Signed)
Addended by: Lynnea Ferrier on: 12/08/2019 03:36 PM   Modules accepted: Orders

## 2019-12-09 NOTE — Addendum Note (Signed)
Addended by: Abelino Derrick A on: 12/09/2019 10:00 AM   Modules accepted: Orders

## 2019-12-09 NOTE — Addendum Note (Signed)
Addended by: Jon Billings on: 12/09/2019 02:21 PM   Modules accepted: Orders

## 2019-12-11 ENCOUNTER — Other Ambulatory Visit (INDEPENDENT_AMBULATORY_CARE_PROVIDER_SITE_OTHER): Payer: Medicare Other

## 2019-12-11 ENCOUNTER — Other Ambulatory Visit: Payer: Self-pay

## 2019-12-11 DIAGNOSIS — R350 Frequency of micturition: Secondary | ICD-10-CM | POA: Diagnosis not present

## 2019-12-11 LAB — URINALYSIS, ROUTINE W REFLEX MICROSCOPIC
Bilirubin Urine: NEGATIVE
Ketones, ur: NEGATIVE
Nitrite: NEGATIVE
Specific Gravity, Urine: 1.025 (ref 1.000–1.030)
Total Protein, Urine: 30 — AB
Urine Glucose: NEGATIVE
Urobilinogen, UA: 0.2 (ref 0.0–1.0)
pH: 6 (ref 5.0–8.0)

## 2019-12-13 LAB — URINE CULTURE
MICRO NUMBER:: 11101611
SPECIMEN QUALITY:: ADEQUATE

## 2019-12-15 ENCOUNTER — Encounter: Payer: Self-pay | Admitting: Family Medicine

## 2019-12-15 DIAGNOSIS — D539 Nutritional anemia, unspecified: Secondary | ICD-10-CM | POA: Insufficient documentation

## 2019-12-15 DIAGNOSIS — D6489 Other specified anemias: Secondary | ICD-10-CM | POA: Insufficient documentation

## 2019-12-15 DIAGNOSIS — N3001 Acute cystitis with hematuria: Secondary | ICD-10-CM | POA: Insufficient documentation

## 2019-12-15 HISTORY — DX: Acute cystitis with hematuria: N30.01

## 2019-12-15 HISTORY — DX: Nutritional anemia, unspecified: D53.9

## 2019-12-15 MED ORDER — CIPROFLOXACIN HCL 500 MG PO TABS: 500.0000 mg | ORAL_TABLET | Freq: Two times a day (BID) | ORAL | 0 refills | Status: AC

## 2019-12-15 NOTE — Addendum Note (Signed)
Addended by: Jon Billings on: 12/15/2019 08:08 AM   Modules accepted: Orders

## 2019-12-25 ENCOUNTER — Ambulatory Visit: Payer: Medicare Other

## 2019-12-25 NOTE — Progress Notes (Deleted)
Subjective:   Sarah [name] is a 79 y.o. female who presents for an Initial Medicare Annual Wellness Visit.  I connected with [facility] today by telephone and verified that I am speaking with the correct person using two identifiers. Location patient: home Location provider: work Persons participating in the virtual visit: patient, Marine scientist.    I discussed the limitations, risks, security and privacy concerns of performing an evaluation and management service by telephone and the availability of in person appointments. I also discussed with the patient that there may be a patient responsible charge related to this service. The patient expressed understanding and verbally consented to this telephonic visit.    Interactive audio and video telecommunications were attempted between this provider and patient, however failed, due to patient having technical difficulties OR patient did not have access to video capability.  We continued and completed visit with audio only.  Some vital signs may be absent or patient reported.   Time Spent with patient on telephone encounter: *** minutes   Review of Systems    ***       Objective:    There were no vitals filed for this visit. There is no height or weight on file to calculate BMI.  Advanced Directives 10/03/2019  Does Patient Have a Medical Advance Directive? Yes  Type of Paramedic of [place];Living will  Copy of [place] in Chart? No - copy requested    Current Medications (verified) Outpatient Encounter Medications as of 12/25/2019  Medication Sig  . acetaminophen (TYLENOL) 500 MG tablet Take 500 mg by mouth every 6 (six) hours as needed.  Marland Kitchen allopurinol (ZYLOPRIM) 100 MG tablet Take 100 mg by mouth daily. 1/2 tablet daily  . busPIRone (BUSPAR) 15 MG tablet Take 15 mg by mouth 2 (two) times daily.  . Cranberry Extract 250 MG TABS Take 500 mg by mouth.  . ezetimibe (ZETIA) 10 MG tablet Take 10  mg by mouth daily.  . fluticasone (FLONASE) 50 MCG/ACT nasal spray Place 2 sprays into both nostrils daily.  Marland Kitchen glucosamine-chondroitin 500-400 MG tablet Take 1 tablet by mouth daily.  Marland Kitchen guaifenesin (HUMIBID E) 400 MG TABS tablet Take 400 mg by mouth every 4 (four) hours.  . hydrochlorothiazide (MICROZIDE) 12.5 MG capsule Take 12.5 mg by mouth daily. (Patient not taking: Reported on 12/08/2019)  . ibuprofen (ADVIL) 200 MG tablet Take 400 mg by mouth 3 (three) times daily.  . lansoprazole (PREVACID) 30 MG capsule Take 30 mg by mouth daily at 12 noon.  . loperamide (IMODIUM) 2 MG capsule Take by mouth as needed for diarrhea or loose stools.  Marland Kitchen loratadine (CLARITIN) 10 MG tablet Take 10 mg by mouth daily as needed for allergies.  Marland Kitchen losartan (COZAAR) 50 MG tablet Take 50 mg by mouth daily. (Patient not taking: Reported on 12/08/2019)  . Melatonin 5 MG CHEW Chew by mouth. (Patient not taking: Reported on 12/08/2019)  . metoprolol tartrate (LOPRESSOR) 25 MG tablet Take 25 mg by mouth 2 (two) times daily.  . Multiple Vitamins-Minerals (EQ MULTIVITAMINS ADULT GUMMY) CHEW Chew by mouth.  . phenazopyridine (PYRIDIUM) 95 MG tablet Take 95 mg by mouth 3 (three) times daily as needed for pain. (Patient not taking: Reported on 12/08/2019)  . potassium chloride (KLOR-CON) 10 MEQ tablet Take 10 mEq by mouth daily.  Marland Kitchen venlafaxine XR (EFFEXOR-XR) 150 MG 24 hr capsule Take 150 mg by mouth daily with breakfast.   No facility-administered encounter medications on file as of  12/25/2019.    Allergies (verified) Amoxicillin, Augmentin [amoxicillin-pot clavulanate], Drug [tape], Sulfa antibiotics, Atenolol, and Macrodantin [nitrofurantoin]   History: Past Medical History:  Diagnosis Date  . Allergy   . CLL (chronic lymphocytic leukemia) ([place])   . GERD (gastroesophageal reflux disease)   . Gout   . HLD (hyperlipidemia)   . Hypertension    Past Surgical History:  Procedure Laterality Date  . ABDOMINAL  HYSTERECTOMY    . APPENDECTOMY    . CHOLECYSTECTOMY    . KIDNEY SURGERY    . MOHS SURGERY     Family History  Problem Relation Age of Onset  . Colon cancer Mother   . Heart attack Father    Social History   Socioeconomic History  . Marital status: Single    Spouse name: Not on file  . Number of children: Not on file  . Years of education: Not on file  . Highest education level: Not on file  Occupational History  . Not on file  Tobacco Use  . Smoking status: Never Smoker  . Smokeless tobacco: Never Used  Vaping Use  . Vaping Use: Never used  Substance and Sexual Activity  . Alcohol use: Not Currently    Comment: Rare social drinking  . Drug use: Never  . Sexual activity: Not Currently  Other Topics Concern  . Not on file  Social History Narrative  . Not on file   Social Determinants of Health   Financial Resource Strain:   . Difficulty of Paying Living Expenses: Not on file  Food Insecurity:   . Worried About Charity fundraiser in the Last Year: Not on file  . Ran Out of Food in the Last Year: Not on file  Transportation Needs:   . Lack of Transportation (Medical): Not on file  . Lack of Transportation (Non-Medical): Not on file  Physical Activity:   . Days of Exercise per Week: Not on file  . Minutes of Exercise per Session: Not on file  Stress:   . Feeling of Stress : Not on file  Social Connections:   . Frequency of Communication with Friends and Family: Not on file  . Frequency of Social Gatherings with Friends and Family: Not on file  . Attends Religious Services: Not on file  . Active Member of Clubs or Organizations: Not on file  . Attends Archivist Meetings: Not on file  . Marital Status: Not on file    Tobacco Counseling Counseling given: Not Answered   Clinical Intake:                 Diabetic?No         Activities of Daily Living No flowsheet data found.  Patient Care Team: Libby Maw, MD as PCP -  General (Family Medicine)  Indicate any recent Medical Services you may have received from other than [facility] providers in the past year (date may be approximate).     Assessment:   This is a routine wellness examination for [facility].  Hearing/Vision screen No exam data present  Dietary issues and exercise activities discussed:    Goals   None    Depression Screen PHQ 2/9 Scores 12/08/2019  PHQ - 2 Score 0    Fall Risk Fall Risk  12/08/2019  Falls in the past year? 0  Number falls in past yr: 0  Injury with Fall? 0    Any stairs in or around the home? {YES/NO:21197} If so, are there any without  handrails? {YES/NO:21197} Home free of loose throw rugs in walkways, pet beds, electrical cords, etc? {YES/NO:21197} Adequate lighting in your home to reduce risk of falls? {YES/NO:21197}  ASSISTIVE DEVICES UTILIZED TO PREVENT FALLS:  Life alert? {YES/NO:21197} Use of a cane, walker or w/c? {YES/NO:21197} Grab bars in the bathroom? {YES/NO:21197} Shower chair or bench in shower? {YES/NO:21197} Elevated toilet seat or a handicapped toilet? {YES/NO:21197}  TIMED UP AND GO:  Was the test performed? {YES/NO:21197}.  Length of time to ambulate 10 feet: *** sec.   {Appearance of STMH:9622297}  Cognitive Function:        Immunizations Immunization History  Administered Date(s) Administered  . PFIZER SARS-COV-2 Vaccination 03/17/2019, 04/07/2019, 12/04/2019    TDAP status: Due, Education has been provided regarding the importance of this vaccine. Advised may receive this vaccine at local pharmacy or Health Dept. Aware to provide a copy of the vaccination record if obtained from local pharmacy or Health Dept. Verbalized acceptance and understanding.   Flu Vaccine status: Up to date   {Pneumococcal vaccine status:2101807}    Covid-19 vaccine status: Completed vaccines  Qualifies for Shingles Vaccine? Yes   Zostavax completed No   Shingrix Completed?: No.    Education  has been provided regarding the importance of this vaccine. Patient has been advised to call insurance company to determine out of pocket expense if they have not yet received this vaccine. Advised may also receive vaccine at local pharmacy or Health Dept. Verbalized acceptance and understanding.  Screening Tests Health Maintenance  Topic Date Due  . TETANUS/TDAP  Never done  . DEXA SCAN  Never done  . PNA vac Low Risk Adult (1 of 2 - PCV13) Never done  . INFLUENZA VACCINE  Completed  . COVID-19 Vaccine  Completed    Health Maintenance  Health Maintenance Due  Topic Date Due  . TETANUS/TDAP  Never done  . DEXA SCAN  Never done  . PNA vac Low Risk Adult (1 of 2 - PCV13) Never done    Colorectal cancer screening: No longer required.    {Mammogram status:21018020}   {Bone Density status:21018021}  Lung Cancer Screening: (Low Dose CT Chest recommended if Age 58-80 years, 30 pack-year currently smoking OR have quit w/in 15years.) does not qualify.  Additional Screening:  Hepatitis C Screening: does not qualify  Vision Screening: Recommended annual ophthalmology exams for early detection of glaucoma and other disorders of the eye. Is the patient up to date with their annual eye exam?  {YES/NO:21197} Who is the provider or what is the name of the office in which the patient attends annual eye exams? *** If pt is not established with a provider, would they like to be referred to a provider to establish care? {YES/NO:21197}.   Dental Screening: Recommended annual dental exams for proper oral hygiene  Community Resource Referral / Chronic Care Management: CRR required this visit?  {YES/NO:21197}  CCM required this visit?  {YES/NO:21197}     Plan:     I have personally reviewed and noted the following in the patient's chart:   . Medical and social history . Use of alcohol, tobacco or illicit drugs  . Current medications and supplements . Functional ability and  status . Nutritional status . Physical activity . Advanced directives . List of other physicians . Hospitalizations, surgeries, and ER visits in previous 12 months . Vitals . Screenings to include cognitive, depression, and falls . Referrals and appointments  In addition, I have reviewed and discussed with patient certain preventive  protocols, quality metrics, and best practice recommendations. A written personalized care plan for preventive services as well as general preventive health recommendations were provided to patient.   Due to this being a telephonic visit, the after visit summary with patients personalized plan was offered to patient via mail or my-chart. ***Patient declined at this time./ Patient would like to access on my-chart/ per request, patient was mailed a copy of AVS./ Patient preferred to pick up at office at next visit.   Marta Antu, LPN   83/08/2900  Nurse Health Advisor  Nurse Notes: ***

## 2019-12-30 ENCOUNTER — Ambulatory Visit (INDEPENDENT_AMBULATORY_CARE_PROVIDER_SITE_OTHER): Payer: Medicare Other

## 2019-12-30 VITALS — Ht 62.5 in | Wt 165.0 lb

## 2019-12-30 DIAGNOSIS — Z0000 Encounter for general adult medical examination without abnormal findings: Secondary | ICD-10-CM

## 2019-12-30 NOTE — Progress Notes (Signed)
Subjective:   Sarah [name] is a 79 y.o. female who presents for Medicare Annual (Subsequent) preventive examination.  I connected with Phyllicia today by telephone and verified that I am speaking with the correct person using two identifiers. Location patient: home Location provider: work Persons participating in the virtual visit: patient, Marine scientist.    I discussed the limitations, risks, security and privacy concerns of performing an evaluation and management service by telephone and the availability of in person appointments. I also discussed with the patient that there may be a patient responsible charge related to this service. The patient expressed understanding and verbally consented to this telephonic visit.    Interactive audio and video telecommunications were attempted between this provider and patient, however failed, due to patient having technical difficulties OR patient did not have access to video capability.  We continued and completed visit with audio only.  Some vital signs may be absent or patient reported.   Time Spent with patient on telephone encounter: 45 minutes   Review of Systems     Cardiac Risk Factors include: advanced age (>68men, >31 women);dyslipidemia;sedentary lifestyle     Objective:    Today's Vitals   12/30/19 1415 12/30/19 1416  Weight: 165 lb (74.8 kg)   Height: 5' 2.5" (1.588 m)   PainSc:  3    Body mass index is 29.7 kg/m.  Advanced Directives 12/30/2019 10/03/2019  Does Patient Have a Medical Advance Directive? Yes Yes  Type of Paramedic of [place];Living will [place];Living will  Copy of [place] in Chart? No - copy requested No - copy requested    Current Medications (verified) Outpatient Encounter Medications as of 12/30/2019  Medication Sig  . acetaminophen (TYLENOL) 500 MG tablet Take 500 mg by mouth every 6 (six) hours as needed.  Marland Kitchen allopurinol (ZYLOPRIM) 100 MG  tablet Take 100 mg by mouth daily. 1/2 tablet daily  . busPIRone (BUSPAR) 15 MG tablet Take 15 mg by mouth 2 (two) times daily.  . Cranberry Extract 250 MG TABS Take 500 mg by mouth.  . ezetimibe (ZETIA) 10 MG tablet Take 10 mg by mouth daily.  . fluticasone (FLONASE) 50 MCG/ACT nasal spray Place 2 sprays into both nostrils daily.  Marland Kitchen glucosamine-chondroitin 500-400 MG tablet Take 1 tablet by mouth daily.  Marland Kitchen guaifenesin (HUMIBID E) 400 MG TABS tablet Take 400 mg by mouth every 4 (four) hours.  . hydrochlorothiazide (MICROZIDE) 12.5 MG capsule Take 12.5 mg by mouth daily.   Marland Kitchen ibuprofen (ADVIL) 200 MG tablet Take 400 mg by mouth 3 (three) times daily.  . lansoprazole (PREVACID) 30 MG capsule Take 30 mg by mouth daily at 12 noon.  . loperamide (IMODIUM) 2 MG capsule Take by mouth as needed for diarrhea or loose stools.  Marland Kitchen loratadine (CLARITIN) 10 MG tablet Take 10 mg by mouth daily as needed for allergies.  Marland Kitchen losartan (COZAAR) 50 MG tablet Take 50 mg by mouth daily.   . Melatonin 5 MG CHEW Chew by mouth.   . metoprolol tartrate (LOPRESSOR) 25 MG tablet Take 25 mg by mouth 2 (two) times daily.  . Multiple Vitamins-Minerals (EQ MULTIVITAMINS ADULT GUMMY) CHEW Chew by mouth.  . phenazopyridine (PYRIDIUM) 95 MG tablet Take 95 mg by mouth 3 (three) times daily as needed for pain.   . potassium chloride (KLOR-CON) 10 MEQ tablet Take 10 mEq by mouth daily.  Marland Kitchen venlafaxine XR (EFFEXOR-XR) 150 MG 24 hr capsule Take 150 mg by  mouth daily with breakfast.   No facility-administered encounter medications on file as of 12/30/2019.    Allergies (verified) Amoxicillin, Augmentin [amoxicillin-pot clavulanate], Drug [tape], Sulfa antibiotics, Atenolol, and Macrodantin [nitrofurantoin]   History: Past Medical History:  Diagnosis Date  . Allergy   . CLL (chronic lymphocytic leukemia) ([place])   . GERD (gastroesophageal reflux disease)   . Gout   . HLD (hyperlipidemia)   . Hypertension    Past Surgical  History:  Procedure Laterality Date  . ABDOMINAL HYSTERECTOMY    . APPENDECTOMY    . CHOLECYSTECTOMY    . KIDNEY SURGERY    . MOHS SURGERY     Family History  Problem Relation Age of Onset  . Colon cancer Mother   . Heart attack Father    Social History   Socioeconomic History  . Marital status: Widowed    Spouse name: Not on file  . Number of children: Not on file  . Years of education: Not on file  . Highest education level: Not on file  Occupational History  . Not on file  Tobacco Use  . Smoking status: Never Smoker  . Smokeless tobacco: Never Used  Vaping Use  . Vaping Use: Never used  Substance and Sexual Activity  . Alcohol use: Not Currently    Comment: Rare social drinking  . Drug use: Never  . Sexual activity: Not Currently  Other Topics Concern  . Not on file  Social History Narrative  . Not on file   Social Determinants of Health   Financial Resource Strain: Low Risk   . Difficulty of Paying Living Expenses: Not hard at all  Food Insecurity: No Food Insecurity  . Worried About Charity fundraiser in the Last Year: Never true  . Ran Out of Food in the Last Year: Never true  Transportation Needs: No Transportation Needs  . Lack of Transportation (Medical): No  . Lack of Transportation (Non-Medical): No  Physical Activity: Inactive  . Days of Exercise per Week: 0 days  . Minutes of Exercise per Session: 0 min  Stress: No Stress Concern Present  . Feeling of Stress : Not at all  Social Connections: Moderately Isolated  . Frequency of Communication with Friends and Family: More than three times a week  . Frequency of Social Gatherings with Friends and Family: Once a week  . Attends Religious Services: 1 to 4 times per year  . Active Member of Clubs or Organizations: No  . Attends Archivist Meetings: Never  . Marital Status: Widowed    Tobacco Counseling Counseling given: Not Answered   Clinical Intake:  Pre-visit preparation  completed: Yes  Pain : 0-10 Pain Score: 3  Pain Type: Chronic pain Pain Location: Back Pain Orientation: Lower Pain Onset: More than a month ago Pain Frequency: Constant     Nutritional Status: BMI 25 -29 Overweight Nutritional Risks: Unintentional weight loss (decreased appetite) Diabetes: No  How often do you need to have someone help you when you read instructions, pamphlets, or other written materials from your doctor or pharmacy?: 1 - Never What is the last grade level you completed in school?: 2 yrs of college  Diabetic?No  Interpreter Needed?: No  Information entered by :: Caroleen Hamman LPN   Activities of Daily Living In your present state of health, do you have any difficulty performing the following activities: 12/30/2019  Hearing? N  Vision? N  Difficulty concentrating or making decisions? Y  Comment sometimes  Walking or  climbing stairs? Y  Dressing or bathing? Y  Comment needs assistance with bathing  Doing errands, shopping? Y  Comment lives with daughter  Conservation officer, nature and eating ? N  Using the Toilet? N  In the past six months, have you accidently leaked urine? Y  Do you have problems with loss of bowel control? N  Managing your Medications? Y  Comment daughter  Managing your Finances? N  Housekeeping or managing your Housekeeping? Y  Comment daughter helps    Patient Care Team: Libby Maw, MD as PCP - General (Family Medicine)  Indicate any recent Medical Services you may have received from other than [facility] providers in the past year (date may be approximate).     Assessment:   This is a routine wellness examination for [facility].  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  Sherryle Lis  Dietary issues and exercise activities discussed: Current Exercise Habits: The patient does not participate in regular exercise at present  Goals    . Patient Stated     Drink more water & increase activity      Depression Screen PHQ 2/9 Scores 12/30/2019 12/08/2019  PHQ - 2 Score 1 0    Fall Risk Fall Risk  12/30/2019 12/08/2019  Falls in the past year? 0 0  Number falls in past yr: 0 0  Injury with Fall? 0 0  Follow up Falls prevention discussed -    Any stairs in or around the home? Yes  If so, are there any without handrails? No  Home free of loose throw rugs in walkways, pet beds, electrical cords, etc? Yes  Adequate lighting in your home to reduce risk of falls? Yes   ASSISTIVE DEVICES UTILIZED TO PREVENT FALLS:  Life alert? No  Use of a cane, walker or w/c? Yes  Grab bars in the bathroom? Yes  Shower chair or bench in shower? No  Elevated toilet seat or a handicapped toilet? No   TIMED UP AND GO:  Was the test performed? No . Phone visit   Cognitive Function:No cognitive impairment noted     6CIT Screen 12/30/2019  What Year? 0 points  What month? 0 points  What time? 0 points  Count back from 20 0 points  Months in reverse 0 points  Repeat phrase 0 points  Total Score 0    Immunizations Immunization History  Administered Date(s) Administered  . Influenza-Unspecified 11/28/2019  . PFIZER SARS-COV-2 Vaccination 03/17/2019, 04/07/2019, 12/04/2019    TDAP status: Due, Education has been provided regarding the importance of this vaccine. Advised may receive this vaccine at local pharmacy or Health Dept. Aware to provide a copy of the vaccination record if obtained from local pharmacy or Health Dept. Verbalized acceptance and understanding.   Flu Vaccine status: Up to date   Pneumococcal vaccine status: Patient states she has had vaccines. Unsure of dates. Awaiting records from previous PCP.  Covid-19 vaccine status: Completed  vaccines  Qualifies for Shingles Vaccine? Yes   Zostavax completed No   Shingrix Completed?: No.    Education has been provided regarding the importance of this vaccine. Patient has been advised to call insurance company to determine out  of pocket expense if they have not yet received this vaccine. Advised may also receive vaccine at local pharmacy or Health Dept. Verbalized acceptance and understanding.  Screening Tests Health Maintenance  Topic Date Due  . TETANUS/TDAP  Never done  . DEXA SCAN  Never done  . PNA vac Low Risk Adult (1 of 2 - PCV13) Never done  . INFLUENZA VACCINE  Completed  . COVID-19 Vaccine  Completed    Health Maintenance  Health Maintenance Due  Topic Date Due  . TETANUS/TDAP  Never done  . DEXA SCAN  Never done  . PNA vac Low Risk Adult (1 of 2 - PCV13) Never done    Colorectal cancer screening: No longer required.    Mammogram status: No longer required.  Double Mastectomy  Bone Density status:Patient unsure of last date. Awaiting records from previous PCP.  Lung Cancer Screening: (Low Dose CT Chest recommended if Age 61-80 years, 30 pack-year currently smoking OR have quit w/in 15years.) does not qualify.     Additional Screening:  Hepatitis C Screening: does not qualify  Vision Screening: Recommended annual ophthalmology exams for early detection of glaucoma and other disorders of the eye. Is the patient up to date with their annual eye exam?  Yes  Who is the provider or what is the name of the office in which the patient attends annual eye exams? Dr. Sherryle Lis  Dental Screening: Recommended annual dental exams for proper oral hygiene  Community Resource Referral / Chronic Care Management: CRR required this visit?  No   CCM required this visit?  No      Plan:     I have personally reviewed and noted the following in the patient's chart:   . Medical and social history . Use of alcohol, tobacco or illicit drugs  . Current medications  and supplements . Functional ability and status . Nutritional status . Physical activity . Advanced directives . List of other physicians . Hospitalizations, surgeries, and ER visits in previous 12 months . Vitals . Screenings to include cognitive, depression, and falls . Referrals and appointments  In addition, I have reviewed and discussed with patient certain preventive protocols, quality metrics, and best practice recommendations. A written personalized care plan for preventive services as well as general preventive health recommendations were provided to patient.   Due to this being a telephonic visit, the after visit summary with patients personalized plan was offered to patient via mail or my-chart. Patient would like to access on my-chart.   Marta Antu, LPN   [id number]/05/[id number]  Nurse Health Advisor  Nurse Notes: None

## 2019-12-30 NOTE — Patient Instructions (Signed)
Ms. Sarah [name] , Thank you for taking time to complete your Medicare Wellness Visit. I appreciate your ongoing commitment to your health goals. Please review the following plan we discussed and let me know if I can assist you in the future.   Screening recommendations/referrals: Colonoscopy: No longer required Mammogram: No longer required Bone Density: Awaiting records from previous PCP. Recommended yearly ophthalmology/optometry visit for glaucoma screening and checkup Recommended yearly dental visit for hygiene and checkup  Vaccinations: Influenza vaccine: Up to date Pneumococcal vaccine:Awaiting records from previous provider Tdap vaccine: Awaiting records from previous provider Shingles vaccine: Awaiting records from previous provider  Covid-19:Completed vaccines  Advanced directives: Please bring a copy for your chart  Conditions/risks identified: See problem list  Next appointment: Follow up in one year for your annual wellness visit    Preventive Care 65 Years and Older, Female Preventive care refers to lifestyle choices and visits with your health care provider that can promote health and wellness. What does preventive care include?  A yearly physical exam. This is also called an annual well check.  Dental exams once or twice a year.  Routine eye exams. Ask your health care provider how often you should have your eyes checked.  Personal lifestyle choices, including:  Daily care of your teeth and gums.  Regular physical activity.  Eating a healthy diet.  Avoiding tobacco and drug use.  Limiting alcohol use.  Practicing safe sex.  Taking low-dose aspirin every day.  Taking vitamin and mineral supplements as recommended by your health care provider. What happens during an annual well check? The services and screenings done by your health care provider during your annual well check will depend on your age, overall health, lifestyle risk factors, and family history  of disease. Counseling  Your health care provider may ask you questions about your:  Alcohol use.  Tobacco use.  Drug use.  Emotional well-being.  Home and relationship well-being.  Sexual activity.  Eating habits.  History of falls.  Memory and ability to understand (cognition).  Work and work Statistician.  Reproductive health. Screening  You may have the following tests or measurements:  Height, weight, and BMI.  Blood pressure.  Lipid and cholesterol levels. These may be checked every 5 years, or more frequently if you are over 108 years old.  Skin check.  Lung cancer screening. You may have this screening every year starting at age 49 if you have a 30-pack-year history of smoking and currently smoke or have quit within the past 15 years.  Fecal occult blood test (FOBT) of the stool. You may have this test every year starting at age 67.  Flexible sigmoidoscopy or colonoscopy. You may have a sigmoidoscopy every 5 years or a colonoscopy every 10 years starting at age 23.  Hepatitis C blood test.  Hepatitis B blood test.  Sexually transmitted disease (STD) testing.  Diabetes screening. This is done by checking your blood sugar (glucose) after you have not eaten for a while (fasting). You may have this done every 1-3 years.  Bone density scan. This is done to screen for osteoporosis. You may have this done starting at age 22.  Mammogram. This may be done every 1-2 years. Talk to your health care provider about how often you should have regular mammograms. Talk with your health care provider about your test results, treatment options, and if necessary, the need for more tests. Vaccines  Your health care provider may recommend certain vaccines, such as:  Influenza vaccine. This  is recommended every year.  Tetanus, diphtheria, and acellular pertussis (Tdap, Td) vaccine. You may need a Td booster every 10 years.  Zoster vaccine. You may need this after age  83.  Pneumococcal 13-valent conjugate (PCV13) vaccine. One dose is recommended after age 49.  Pneumococcal polysaccharide (PPSV23) vaccine. One dose is recommended after age 21. Talk to your health care provider about which screenings and vaccines you need and how often you need them. This information is not intended to replace advice given to you by your health care provider. Make sure you discuss any questions you have with your health care provider. Document Released: 03/05/2015 Document Revised: 10/27/2015 Document Reviewed: 12/08/2014 Elsevier Interactive Patient Education  2017 [place] Prevention in the Home Falls can cause injuries. They can happen to people of all ages. There are many things you can do to make your home safe and to help prevent falls. What can I do on the outside of my home?  Regularly fix the edges of walkways and driveways and fix any cracks.  Remove anything that might make you trip as you walk through a door, such as a raised step or threshold.  Trim any bushes or trees on the path to your home.  Use bright outdoor lighting.  Clear any walking paths of anything that might make someone trip, such as rocks or tools.  Regularly check to see if handrails are loose or broken. Make sure that both sides of any steps have handrails.  Any raised decks and porches should have guardrails on the edges.  Have any leaves, snow, or ice cleared regularly.  Use sand or salt on walking paths during winter.  Clean up any spills in your garage right away. This includes oil or grease spills. What can I do in the bathroom?  Use night lights.  Install grab bars by the toilet and in the tub and shower. Do not use towel bars as grab bars.  Use non-skid mats or decals in the tub or shower.  If you need to sit down in the shower, use a plastic, non-slip stool.  Keep the floor dry. Clean up any water that spills on the floor as soon as it happens.  Remove  soap buildup in the tub or shower regularly.  Attach bath mats securely with double-sided non-slip rug tape.  Do not have throw rugs and other things on the floor that can make you trip. What can I do in the bedroom?  Use night lights.  Make sure that you have a light by your bed that is easy to reach.  Do not use any sheets or blankets that are too big for your bed. They should not hang down onto the floor.  Have a firm chair that has side arms. You can use this for support while you get dressed.  Do not have throw rugs and other things on the floor that can make you trip. What can I do in the kitchen?  Clean up any spills right away.  Avoid walking on wet floors.  Keep items that you use a lot in easy-to-reach places.  If you need to reach something above you, use a strong step stool that has a grab bar.  Keep electrical cords out of the way.  Do not use floor polish or wax that makes floors slippery. If you must use wax, use non-skid floor wax.  Do not have throw rugs and other things on the floor that can make you  trip. What can I do with my stairs?  Do not leave any items on the stairs.  Make sure that there are handrails on both sides of the stairs and use them. Fix handrails that are broken or loose. Make sure that handrails are as long as the stairways.  Check any carpeting to make sure that it is firmly attached to the stairs. Fix any carpet that is loose or worn.  Avoid having throw rugs at the top or bottom of the stairs. If you do have throw rugs, attach them to the floor with carpet tape.  Make sure that you have a light switch at the top of the stairs and the bottom of the stairs. If you do not have them, ask someone to add them for you. What else can I do to help prevent falls?  Wear shoes that:  Do not have high heels.  Have rubber bottoms.  Are comfortable and fit you well.  Are closed at the toe. Do not wear sandals.  If you use a  stepladder:  Make sure that it is fully opened. Do not climb a closed stepladder.  Make sure that both sides of the stepladder are locked into place.  Ask someone to hold it for you, if possible.  Clearly mark and make sure that you can see:  Any grab bars or handrails.  First and last steps.  Where the edge of each step is.  Use tools that help you move around (mobility aids) if they are needed. These include:  Canes.  Walkers.  Scooters.  Crutches.  Turn on the lights when you go into a dark area. Replace any light bulbs as soon as they burn out.  Set up your furniture so you have a clear path. Avoid moving your furniture around.  If any of your floors are uneven, fix them.  If there are any pets around you, be aware of where they are.  Review your medicines with your doctor. Some medicines can make you feel dizzy. This can increase your chance of falling. Ask your doctor what other things that you can do to help prevent falls. This information is not intended to replace advice given to you by your health care provider. Make sure you discuss any questions you have with your health care provider. Document Released: 12/03/2008 Document Revised: 07/15/2015 Document Reviewed: 03/13/2014 Elsevier Interactive Patient Education  2017 [employer].

## 2020-01-04 NOTE — Progress Notes (Signed)
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 No treatment pursued at that time.  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  4) 06/14/15-received R-Benda, but d/c due to poor tolerance 5) 05/06/18-06/26/2018: ibrutinib therapy, d/c due to poor tolerance 6) 09/02/2019: last visit with Dr. Phillip Heal at the [facility] clinic in [place], [place].  6) 10/03/2019: establish care with Dr. 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 female with medical history significant for CLL and breast cancer in remission who presents for a follow up visit. The patient's last visit was on 10/03/2019. In the interim since the last visit Sarah [name] has had no hospitalizations/ED visits.   On exam today Sarah [name] notes that she has been well in the interim since her last visit.  She notes that she did have a UTI in mid October but that is resolved with 5 days of antibiotic therapy.  She reports that she is only taking about 50 mg of allopurinol at this time as she was having some symptoms with her memory and visual hallucinations on a full dose.  She notes that she does not move around a lot and can be limited by dizziness.  She uses her walker all the time.  On exam today she is mildly bradycardic with a heart rate of 53 but a blood pressure of 132/74.  She otherwise denies having any issues today with fevers, chills, sweats, nausea, vomiting or  diarrhea.  Her weight has been stable.  A full 10 point ROS is listed below.  MEDICAL HISTORY:  Past Medical History:  Diagnosis Date  . Allergy   . CLL (chronic lymphocytic leukemia) ([place])   . GERD (gastroesophageal reflux disease)   . Gout   . HLD (hyperlipidemia)   . Hypertension     SURGICAL HISTORY: Past Surgical History:  Procedure Laterality Date  . ABDOMINAL HYSTERECTOMY    . APPENDECTOMY    . CHOLECYSTECTOMY    . KIDNEY SURGERY    . MOHS SURGERY      SOCIAL HISTORY: Social History   Socioeconomic History  . Marital status: Widowed    Spouse name: Not on file  . Number of children: Not on file  . Years of education: Not on file  . Highest education level: Not on file  Occupational History  . Not on file  Tobacco Use  . Smoking status: Never Smoker  . Smokeless tobacco: Never Used  Vaping Use  . Vaping Use: Never used  Substance and Sexual Activity  . Alcohol use: Not Currently    Comment: Rare social drinking  . Drug use: Never  . Sexual activity: Not Currently  Other Topics Concern  . Not on file  Social History Narrative  . Not on file   Social Determinants of Health   Financial Resource Strain: Low Risk   . Difficulty of Paying Living Expenses: Not hard at all  Food Insecurity: No Food Insecurity  . Worried About Estate manager/land agent  of Food in the Last Year: Never true  . Ran Out of Food in the Last Year: Never true  Transportation Needs: No Transportation Needs  . Lack of Transportation (Medical): No  . Lack of Transportation (Non-Medical): No  Physical Activity: Inactive  . Days of Exercise per Week: 0 days  . Minutes of Exercise per Session: 0 min  Stress: No Stress Concern Present  . Feeling of Stress : Not at all  Social Connections: Moderately Isolated  . Frequency of Communication with Friends and Family: More than three times a week  . Frequency of Social Gatherings with Friends and Family: Once a week  . Attends Religious Services: 1 to 4  times per year  . Active Member of Clubs or Organizations: No  . Attends Archivist Meetings: Never  . Marital Status: Widowed  Intimate Partner Violence: Not At Risk  . Fear of Current or Ex-Partner: No  . Emotionally Abused: No  . Physically Abused: No  . Sexually Abused: No    FAMILY HISTORY: Family History  Problem Relation Age of Onset  . Colon cancer Mother   . Heart attack Father     ALLERGIES:  is allergic to amoxicillin, augmentin [amoxicillin-pot clavulanate], drug [tape], sulfa antibiotics, atenolol, and macrodantin [nitrofurantoin].  MEDICATIONS:  Current Outpatient Medications  Medication Sig Dispense Refill  . acetaminophen (TYLENOL) 500 MG tablet Take 500 mg by mouth every 6 (six) hours as needed.    Marland Kitchen allopurinol (ZYLOPRIM) 100 MG tablet Take 100 mg by mouth daily. 1/2 tablet daily    . busPIRone (BUSPAR) 15 MG tablet Take 1 tablet (15 mg total) by mouth 2 (two) times daily. 180 tablet 1  . Cranberry Extract 250 MG TABS Take 500 mg by mouth.    . ezetimibe (ZETIA) 10 MG tablet Take 10 mg by mouth daily.    . fluticasone (FLONASE) 50 MCG/ACT nasal spray Place 2 sprays into both nostrils daily. 16 g 6  . glucosamine-chondroitin 500-400 MG tablet Take 1 tablet by mouth daily.    Marland Kitchen guaifenesin (HUMIBID E) 400 MG TABS tablet Take 400 mg by mouth every 4 (four) hours.    Marland Kitchen ibuprofen (ADVIL) 200 MG tablet Take 400 mg by mouth 3 (three) times daily.    . lansoprazole (PREVACID) 30 MG capsule Take 30 mg by mouth daily at 12 noon.    . loperamide (IMODIUM) 2 MG capsule Take by mouth as needed for diarrhea or loose stools.    Marland Kitchen loratadine (CLARITIN) 10 MG tablet Take 10 mg by mouth daily as needed for allergies.    . Melatonin 5 MG CHEW Chew by mouth.     . Multiple Vitamins-Minerals (EQ MULTIVITAMINS ADULT GUMMY) CHEW Chew by mouth.    . phenazopyridine (PYRIDIUM) 95 MG tablet Take 95 mg by mouth 3 (three) times daily as needed for pain.     . potassium chloride  (KLOR-CON) 10 MEQ tablet Take 10 mEq by mouth daily.    Marland Kitchen venlafaxine XR (EFFEXOR-XR) 150 MG 24 hr capsule Take 150 mg by mouth daily with breakfast.     No current facility-administered medications for this visit.    REVIEW OF SYSTEMS:   Constitutional: ( - ) fevers, ( - )  chills , ( - ) night sweats Eyes: ( - ) blurriness of vision, ( - ) double vision, ( - ) watery eyes Ears, nose, mouth, throat, and face: ( - ) mucositis, ( - ) sore throat Respiratory: ( - )  cough, ( - ) dyspnea, ( - ) wheezes Cardiovascular: ( - ) palpitation, ( - ) chest discomfort, ( - ) lower extremity swelling Gastrointestinal:  ( - ) nausea, ( - ) heartburn, ( - ) change in bowel habits Skin: ( - ) abnormal skin rashes Lymphatics: ( - ) new lymphadenopathy, ( - ) easy bruising Neurological: ( - ) numbness, ( - ) tingling, ( - ) new weaknesses Behavioral/Psych: ( - ) mood change, ( - ) new changes  All other systems were reviewed with the patient and are negative.  PHYSICAL EXAMINATION: ECOG PERFORMANCE STATUS: 3 - Symptomatic, >50% confined to bed  Vitals:   01/05/20 1219  BP: 132/74  Pulse: (!) 53  Resp: 18  Temp: (!) 96.8 F (36 C)  SpO2: 97%   Filed Weights   01/05/20 1219  Weight: 164 lb 4.8 oz (74.5 kg)    GENERAL: chronically ill appearing elderly Caucasian female in NAD  SKIN: skin color, texture, turgor are normal, no rashes or significant lesions EYES: conjunctiva are pink and non-injected, sclera clear LUNGS: clear to auscultation and percussion with normal breathing effort HEART: regular rate & rhythm and no murmurs and no lower extremity edema ABDOMEN: soft, non-tender, non-distended, normal bowel sounds Musculoskeletal: no cyanosis of digits and no clubbing  PSYCH: alert & oriented x 3, fluent speech NEURO: no focal motor/sensory deficits  LABORATORY DATA:  I have reviewed the data as listed CBC Latest Ref Rng & Units 01/05/2020 12/08/2019 10/03/2019  WBC 4.0 - 10.5 K/uL 34.4(H)  60.4 Repeated and verified X2.([place]) 25.6(H)  Hemoglobin 12.0 - 15.0 g/dL 11.4(L) 12.3 10.5(L)  Hematocrit 36 - 46 % 36.3 38.9 33.1(L)  Platelets 150 - 400 K/uL 161 234.0 200    CMP Latest Ref Rng & Units 01/05/2020 12/08/2019 10/03/2019  Glucose 70 - 99 mg/dL 108(H) 93 119(H)  BUN 8 - 23 mg/dL 17 16 44(H)  Creatinine 0.44 - 1.00 mg/dL 0.96 1.10 1.71(H)  Sodium 135 - 145 mmol/L 141 139 138  Potassium 3.5 - 5.1 mmol/L 4.1 4.3 3.4(L)  Chloride 98 - 111 mmol/L 110 105 108  CO2 22 - 32 mmol/L 23 23 18(L)  Calcium 8.9 - 10.3 mg/dL 9.1 9.1 9.5  Total Protein 6.5 - 8.1 g/dL 5.6(L) 5.9(L) 5.6(L)  Total Bilirubin 0.3 - 1.2 mg/dL 0.5 0.5 0.5  Alkaline Phos 38 - 126 U/L 109 139(H) 159(H)  AST 15 - 41 U/L 19 24 20   ALT 0 - 44 U/L 11 11 12     RADIOGRAPHIC STUDIES: No results found.  ASSESSMENT & PLAN Sarah [name] 79 y.o. female with medical history significant for CLL and breast cancer in remission who presents for a follow up visit.  Review the labs, the records, and discussion with the patient the findings are most consistent with a diagnosis of CLL that does not currently require treatment.  She has no palpable lymphadenopathy and no evidence of anemia and thrombocytopenia.  As such I do believe would be appropriate to continue observation at this time.  She has been attempted on therapy as before with ibrutinib and rituximab bendamustine but is not been able to tolerate these treatments.  As such I do believe that observation alone with discussion of comfort based care if the patient were to have progression.  We will plan to see the patient back in approximately 3 months time in order to reassess.  #Chronic Lymphocytic Leukemia --based on prior labs and studies the patient has a confirmed diagnosis of  CLL.  --will order CBC, CMP, LDH today --patient has attempted prior therapy with ibrutinib and R-Benda but not been able to tolerate thearpy.  --no record of lymphadenopathy or splenomegaly.  Patient does not have anemia/thrombocytopenia on labs  --RTC in 3 months or sooner if treatment is indicated/more evaluation is required.   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 --continue yearly mammograms   No orders of the defined types were placed in this encounter.  All questions were answered. The patient knows to call the clinic with any problems, questions or concerns.  A total of more than 30 minutes were spent on this encounter and over half of that time was spent on counseling and coordination of care as outlined above.   Ledell Peoples, MD Department of Hematology/Oncology [place] at [facility] Phone: [phone] Pager: [phone] Email: [email or web address]  01/09/2020 8:33 AM

## 2020-01-05 ENCOUNTER — Encounter: Payer: Self-pay | Admitting: Hematology and Oncology

## 2020-01-05 ENCOUNTER — Other Ambulatory Visit: Payer: Self-pay

## 2020-01-05 ENCOUNTER — Other Ambulatory Visit: Payer: Self-pay | Admitting: Hematology and Oncology

## 2020-01-05 ENCOUNTER — Inpatient Hospital Stay: Payer: Medicare Other | Attending: Hematology and Oncology | Admitting: Hematology and Oncology

## 2020-01-05 ENCOUNTER — Inpatient Hospital Stay: Payer: Medicare Other

## 2020-01-05 VITALS — BP 132/74 | HR 53 | Temp 96.8°F | Resp 18 | Ht 62.5 in | Wt 164.3 lb

## 2020-01-05 DIAGNOSIS — Z0000 Encounter for general adult medical examination without abnormal findings: Secondary | ICD-10-CM | POA: Diagnosis not present

## 2020-01-05 DIAGNOSIS — C9110 Chronic lymphocytic leukemia of B-cell type not having achieved remission: Secondary | ICD-10-CM

## 2020-01-05 DIAGNOSIS — Z853 Personal history of malignant neoplasm of breast: Secondary | ICD-10-CM | POA: Insufficient documentation

## 2020-01-05 LAB — CBC WITH DIFFERENTIAL (CANCER CENTER ONLY)
Abs Immature Granulocytes: 0.05 10*3/uL (ref 0.00–0.07)
Basophils Absolute: 0.1 10*3/uL (ref 0.0–0.1)
Basophils Relative: 0 %
Eosinophils Absolute: 0.5 10*3/uL (ref 0.0–0.5)
Eosinophils Relative: 2 %
HCT: 36.3 % (ref 36.0–46.0)
Hemoglobin: 11.4 g/dL — ABNORMAL LOW (ref 12.0–15.0)
Immature Granulocytes: 0 %
Lymphocytes Relative: 91 %
Lymphs Abs: 31.5 10*3/uL — ABNORMAL HIGH (ref 0.7–4.0)
MCH: 33.5 pg (ref 26.0–34.0)
MCHC: 31.4 g/dL (ref 30.0–36.0)
MCV: 106.8 fL — ABNORMAL HIGH (ref 80.0–100.0)
Monocytes Absolute: 0.6 10*3/uL (ref 0.1–1.0)
Monocytes Relative: 2 %
Neutro Abs: 1.6 10*3/uL — ABNORMAL LOW (ref 1.7–7.7)
Neutrophils Relative %: 5 %
Platelet Count: 161 10*3/uL (ref 150–400)
RBC: 3.4 MIL/uL — ABNORMAL LOW (ref 3.87–5.11)
RDW: 15.4 % (ref 11.5–15.5)
WBC Count: 34.4 10*3/uL — ABNORMAL HIGH (ref 4.0–10.5)
nRBC: 0 % (ref 0.0–0.2)

## 2020-01-05 LAB — CMP (CANCER CENTER ONLY)
ALT: 11 U/L (ref 0–44)
AST: 19 U/L (ref 15–41)
Albumin: 3.5 g/dL (ref 3.5–5.0)
Alkaline Phosphatase: 109 U/L (ref 38–126)
Anion gap: 8 (ref 5–15)
BUN: 17 mg/dL (ref 8–23)
CO2: 23 mmol/L (ref 22–32)
Calcium: 9.1 mg/dL (ref 8.9–10.3)
Chloride: 110 mmol/L (ref 98–111)
Creatinine: 0.96 mg/dL (ref 0.44–1.00)
GFR, Estimated: 60 mL/min — ABNORMAL LOW (ref >60)
Glucose, Bld: 108 mg/dL — ABNORMAL HIGH (ref 70–99)
Potassium: 4.1 mmol/L (ref 3.5–5.1)
Sodium: 141 mmol/L (ref 135–145)
Total Bilirubin: 0.5 mg/dL (ref 0.3–1.2)
Total Protein: 5.6 g/dL — ABNORMAL LOW (ref 6.5–8.1)

## 2020-01-05 LAB — LACTATE DEHYDROGENASE: LDH: 219 U/L — ABNORMAL HIGH (ref 98–192)

## 2020-01-06 ENCOUNTER — Telehealth: Payer: Self-pay

## 2020-01-06 MED ORDER — BUSPIRONE HCL 15 MG PO TABS
15.0000 mg | ORAL_TABLET | Freq: Two times a day (BID) | ORAL | 3 refills | Status: DC
Start: 2020-01-06 — End: 2020-01-08

## 2020-01-06 NOTE — Telephone Encounter (Signed)
Please see message and advise.  Thank you. Last OV 12/08/19 Last fill Historical Provider

## 2020-01-07 ENCOUNTER — Telehealth: Payer: Self-pay | Admitting: Hematology and Oncology

## 2020-01-07 NOTE — Telephone Encounter (Signed)
Scheduled per los. Called and left msg. Mailed printout  °

## 2020-01-07 NOTE — Telephone Encounter (Signed)
Pts daughter wanted to know if it was only written for a 15 day supply for a certain reason, She wanted to know if it could be filled for a 90 day supply. She asked for a call back [phone]

## 2020-01-08 MED ORDER — BUSPIRONE HCL 15 MG PO TABS
15.0000 mg | ORAL_TABLET | Freq: Two times a day (BID) | ORAL | 1 refills | Status: DC
Start: 2020-01-08 — End: 2020-07-15

## 2020-01-08 NOTE — Addendum Note (Signed)
Addended by: Dutch Quint B on: 01/08/2020 10:08 PM   Modules accepted: Orders

## 2020-01-09 NOTE — Telephone Encounter (Signed)
Pt informed

## 2020-01-16 ENCOUNTER — Ambulatory Visit
Admission: EM | Admit: 2020-01-16 | Discharge: 2020-01-16 | Disposition: A | Discharge status: Home / Self Care | Payer: Medicare Other | Attending: Emergency Medicine | Admitting: Emergency Medicine

## 2020-01-16 ENCOUNTER — Other Ambulatory Visit: Payer: Self-pay

## 2020-01-16 DIAGNOSIS — N3001 Acute cystitis with hematuria: Secondary | ICD-10-CM | POA: Insufficient documentation

## 2020-01-16 LAB — POCT URINALYSIS DIP (MANUAL ENTRY)
Bilirubin, UA: NEGATIVE
Glucose, UA: NEGATIVE mg/dL
Ketones, POC UA: NEGATIVE mg/dL
Nitrite, UA: POSITIVE — AB
Protein Ur, POC: NEGATIVE mg/dL
Spec Grav, UA: 1.015 (ref 1.010–1.025)
Urobilinogen, UA: 0.2 E.U./dL
pH, UA: 5.5 (ref 5.0–8.0)

## 2020-01-16 MED ORDER — CIPROFLOXACIN HCL 500 MG PO TABS: 500.0000 mg | ORAL_TABLET | Freq: Two times a day (BID) | ORAL | 0 refills | Status: DC

## 2020-01-16 NOTE — ED Triage Notes (Signed)
Pt present urinary frequency with some pressure below. Symptoms started two days ago. Pt states she is going to the bathroom more frequently then normal

## 2020-01-16 NOTE — Discharge Instructions (Signed)
Take antibiotic twice daily with food. Important to drink plenty of water throughout the day. Return for worsening urinary symptoms, blood in urine, abdominal or back pain, fever. 

## 2020-01-16 NOTE — ED Provider Notes (Signed)
[facility]    CSN: [id number] Arrival date & time: 01/16/20  1324      History   Chief Complaint Chief Complaint  Patient presents with  . Abdominal Pain  . Urinary Frequency    HPI Sarah [name] is a 79 y.o. female  With history of UTI presenting for the same.  Endorsing urinary frequency with suprapubic pressure that began 2 days ago.  Reports baseline of incontinence: No change thereof.  Last treated for UTI greater than 1 month ago with ciprofloxacin: Tolerated this well.  Denies vaginal discharge, fever.  Past Medical History:  Diagnosis Date  . Allergy   . CLL (chronic lymphocytic leukemia) ([place])   . GERD (gastroesophageal reflux disease)   . Gout   . HLD (hyperlipidemia)   . Hypertension     Patient Active Problem List   Diagnosis Date Noted  . 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 Acute cystitis with hematuria 12/15/2019  . Allergic rhinitis due to pollen 12/08/2019  . Elevated cholesterol 12/08/2019  . History of MI (myocardial infarction) 12/08/2019  . Urinary frequency 12/08/2019  . CLL (chronic lymphocytic leukemia) ([place]) 12/08/2019  . History of gout 12/08/2019    Past Surgical History:  Procedure Laterality Date  . ABDOMINAL HYSTERECTOMY    . APPENDECTOMY    . CHOLECYSTECTOMY    . KIDNEY SURGERY    . MOHS SURGERY      OB History   No obstetric history on file.      Home Medications    Prior to Admission medications   Medication Sig Start Date End Date Taking? Authorizing Provider  acetaminophen (TYLENOL) 500 MG tablet Take 500 mg by mouth every 6 (six) hours as needed.    [provider]  allopurinol (ZYLOPRIM) 100 MG tablet Take 100 mg by mouth daily. 1/2 tablet daily    [provider]  busPIRone (BUSPAR) 15 MG tablet Take 1 tablet (15 mg total) by mouth 2 (two) times daily. 01/08/20   Kennyth Arnold, FNP  ciprofloxacin (CIPRO) 500 MG tablet Take 1 tablet (500 mg total) by mouth every 12 (twelve) hours.  01/16/20   Hall-Potvin, [place], PA-C  Cranberry Extract 250 MG TABS Take 500 mg by mouth.    [provider]  ezetimibe (ZETIA) 10 MG tablet Take 10 mg by mouth daily.    [provider]  fluticasone (FLONASE) 50 MCG/ACT nasal spray Place 2 sprays into both nostrils daily. 12/08/19   Libby Maw, MD  glucosamine-chondroitin 500-400 MG tablet Take 1 tablet by mouth daily.    [provider]  guaifenesin (HUMIBID E) 400 MG TABS tablet Take 400 mg by mouth every 4 (four) hours.    [provider]  ibuprofen (ADVIL) 200 MG tablet Take 400 mg by mouth 3 (three) times daily.    [provider]  lansoprazole (PREVACID) 30 MG capsule Take 30 mg by mouth daily at 12 noon.    [provider]  loperamide (IMODIUM) 2 MG capsule Take by mouth as needed for diarrhea or loose stools.    [provider]  loratadine (CLARITIN) 10 MG tablet Take 10 mg by mouth daily as needed for allergies.    [provider]  Melatonin 5 MG CHEW Chew by mouth.     [provider]  Multiple Vitamins-Minerals (EQ MULTIVITAMINS ADULT GUMMY) CHEW Chew by mouth.    [provider]  phenazopyridine (PYRIDIUM) 95 MG tablet Take 95 mg by mouth 3 (three) times daily  as needed for pain.     [provider]  potassium chloride (KLOR-CON) 10 MEQ tablet Take 10 mEq by mouth daily.    [provider]  venlafaxine XR (EFFEXOR-XR) 150 MG 24 hr capsule Take 150 mg by mouth daily with breakfast.    [provider]  metoprolol tartrate (LOPRESSOR) 25 MG tablet Take 25 mg by mouth 2 (two) times daily.  01/05/20  [provider]    Family History Family History  Problem Relation Age of Onset  . Colon cancer Mother   . Heart attack Father     Social History Social History   Tobacco Use  . Smoking status: Never Smoker  . Smokeless tobacco: Never Used  Vaping Use  . Vaping Use: Never used  Substance Use  Topics  . Alcohol use: Not Currently    Comment: Rare social drinking  . Drug use: Never     Allergies   Amoxicillin, Augmentin [amoxicillin-pot clavulanate], Drug [tape], Sulfa antibiotics, Atenolol, and Macrodantin [nitrofurantoin]   Review of Systems Review of Systems  Constitutional: Negative for fatigue and fever.  Respiratory: Negative for cough and shortness of breath.   Cardiovascular: Negative for chest pain and palpitations.  Gastrointestinal: Negative for constipation and diarrhea.  Genitourinary: Positive for dysuria, frequency and urgency. Negative for flank pain, hematuria, pelvic pain, vaginal bleeding, vaginal discharge and vaginal pain.     Physical Exam Triage Vital Signs ED Triage Vitals [01/16/20 1334]  Enc Vitals Group     BP      Pulse Rate 81     Resp 16     Temp (!) 97.4 F (36.3 C)     Temp Source Oral     SpO2 96 %     Weight      Height      Head Circumference      Peak Flow      Pain Score      Pain Loc      Pain Edu?      Excl. in [place]?    No data found.  Updated Vital Signs Pulse 81   Temp (!) 97.4 F (36.3 C) (Oral)   Resp 16   SpO2 96%   Visual Acuity Right Eye Distance:   Left Eye Distance:   Bilateral Distance:    Right Eye Near:   Left Eye Near:    Bilateral Near:     Physical Exam Constitutional:      General: She is not in acute distress. HENT:     Head: Normocephalic and atraumatic.  Eyes:     General: No scleral icterus.    Pupils: Pupils are equal, round, and reactive to light.  Cardiovascular:     Rate and Rhythm: Normal rate.  Pulmonary:     Effort: Pulmonary effort is normal.  Abdominal:     General: Bowel sounds are normal.     Palpations: Abdomen is soft.     Tenderness: There is no abdominal tenderness. There is no right CVA tenderness, left CVA tenderness or guarding.  Skin:    Coloration: Skin is not jaundiced or pale.  Neurological:     Mental Status: She is alert and oriented to person,  place, and time.      UC Treatments / Results  Labs (all labs ordered are listed, but only abnormal results are displayed) Labs Reviewed  POCT URINALYSIS DIP (MANUAL ENTRY) - Abnormal; Notable for the following components:      Result Value  Clarity, UA hazy (*)    Blood, UA trace-intact (*)    Nitrite, UA Positive (*)    Leukocytes, UA Small (1+) (*)    All other components within normal limits    EKG   Radiology No results found.  Procedures Procedures (including critical care time)  Medications Ordered in UC Medications - No data to display  Initial Impression / Assessment and Plan / UC Course  I have reviewed the triage vital signs and the nursing notes.  Pertinent labs & imaging results that were available during my care of the patient were reviewed by me and considered in my medical decision making (see chart for details).     Patient afebrile, nontoxic in office today.  Urine dipstick with blood, nitrates, leukocytes, culture pending.  Per chart review, last culture from 12/11/2019 with Klebsiella pneumonia sensitive to ciprofloxacin.  Will start antibiotics today, follow-up with urology/PCP in 1-2 weeks.  Return precautions discussed, pt and daughter verbalized understanding and are agreeable to plan. Final Clinical Impressions(s) / UC Diagnoses   Final diagnoses:  Acute cystitis with hematuria     Discharge Instructions     Take antibiotic twice daily with food. Important to drink plenty of water throughout the day. Return for worsening urinary symptoms, blood in urine, abdominal or back pain, fever.    ED Prescriptions    Medication Sig Dispense Auth. Provider   ciprofloxacin (CIPRO) 500 MG tablet Take 1 tablet (500 mg total) by mouth every 12 (twelve) hours. 10 tablet Hall-Potvin, [place], PA-C     PDMP not reviewed this encounter.   Hall-Potvin, [place], [place] 01/16/20 1459

## 2020-01-21 ENCOUNTER — Telehealth (HOSPITAL_COMMUNITY): Payer: Self-pay | Admitting: Emergency Medicine

## 2020-01-21 LAB — URINE CULTURE: Culture: >=100000 — AB

## 2020-01-21 MED ORDER — CEPHALEXIN 500 MG PO CAPS: 500.0000 mg | ORAL_CAPSULE | Freq: Two times a day (BID) | ORAL | 0 refills | Status: AC

## 2020-02-09 ENCOUNTER — Telehealth: Payer: Self-pay

## 2020-02-09 NOTE — Telephone Encounter (Signed)
Volunteer with Palliative Care placed phone call to check in on patient. Patient shared that she is currently on antibiotics for UTI.

## 2020-02-10 ENCOUNTER — Ambulatory Visit (INDEPENDENT_AMBULATORY_CARE_PROVIDER_SITE_OTHER): Payer: Medicare Other | Admitting: Family

## 2020-02-10 ENCOUNTER — Encounter: Payer: Self-pay | Admitting: Family

## 2020-02-10 ENCOUNTER — Other Ambulatory Visit: Payer: Self-pay

## 2020-02-10 VITALS — BP 124/76 | HR 66 | Temp 97.0°F | Ht 62.0 in | Wt 162.8 lb

## 2020-02-10 DIAGNOSIS — R300 Dysuria: Secondary | ICD-10-CM

## 2020-02-10 DIAGNOSIS — A499 Bacterial infection, unspecified: Secondary | ICD-10-CM | POA: Diagnosis not present

## 2020-02-10 DIAGNOSIS — N390 Urinary tract infection, site not specified: Secondary | ICD-10-CM | POA: Diagnosis not present

## 2020-02-10 LAB — POCT URINALYSIS DIPSTICK
Bilirubin, UA: NEGATIVE
Blood, UA: NEGATIVE
Glucose, UA: NEGATIVE
Ketones, UA: NEGATIVE
Nitrite, UA: NEGATIVE
Protein, UA: POSITIVE — AB
Spec Grav, UA: 1.025 (ref 1.010–1.025)
Urobilinogen, UA: 0.2 E.U./dL
pH, UA: 6 (ref 5.0–8.0)

## 2020-02-10 MED ORDER — CEPHALEXIN 500 MG PO CAPS: 500.0000 mg | ORAL_CAPSULE | Freq: Three times a day (TID) | ORAL | 0 refills | Status: DC

## 2020-02-10 NOTE — Patient Instructions (Signed)
Urinary Tract Infection, Adult A urinary tract infection (UTI) is an infection of any part of the urinary tract. The urinary tract includes:  The kidneys.  The ureters.  The bladder.  The urethra. These organs make, store, and get rid of pee (urine) in the body. What are the causes? This is caused by germs (bacteria) in your genital area. These germs grow and cause swelling (inflammation) of your urinary tract. What increases the risk? You are more likely to develop this condition if:  You have a small, thin tube (catheter) to drain pee.  You cannot control when you pee or poop (incontinence).  You are female, and: ? You use these methods to prevent pregnancy:  A medicine that kills sperm (spermicide).  A device that blocks sperm (diaphragm). ? You have low levels of a female hormone (estrogen). ? You are pregnant.  You have genes that add to your risk.  You are sexually active.  You take antibiotic medicines.  You have trouble peeing because of: ? A prostate that is bigger than normal, if you are female. ? A blockage in the part of your body that drains pee from the bladder (urethra). ? A kidney stone. ? A nerve condition that affects your bladder (neurogenic bladder). ? Not getting enough to drink. ? Not peeing often enough.  You have other conditions, such as: ? Diabetes. ? A weak disease-fighting system (immune system). ? Sickle cell disease. ? Gout. ? Injury of the spine. What are the signs or symptoms? Symptoms of this condition include:  Needing to pee right away (urgently).  Peeing often.  Peeing small amounts often.  Pain or burning when peeing.  Blood in the pee.  Pee that smells bad or not like normal.  Trouble peeing.  Pee that is cloudy.  Fluid coming from the vagina, if you are female.  Pain in the belly or lower back. Other symptoms include:  Throwing up (vomiting).  No urge to eat.  Feeling mixed up (confused).  Being tired  and grouchy (irritable).  A fever.  Watery poop (diarrhea). How is this treated? This condition may be treated with:  Antibiotic medicine.  Other medicines.  Drinking enough water. Follow these instructions at home:  Medicines  Take over-the-counter and prescription medicines only as told by your doctor.  If you were prescribed an antibiotic medicine, take it as told by your doctor. Do not stop taking it even if you start to feel better. General instructions  Make sure you: ? Pee until your bladder is empty. ? Do not hold pee for a long time. ? Empty your bladder after sex. ? Wipe from front to back after pooping if you are a female. Use each tissue one time when you wipe.  Drink enough fluid to keep your pee pale yellow.  Keep all follow-up visits as told by your doctor. This is important. Contact a doctor if:  You do not get better after 1-2 days.  Your symptoms go away and then come back. Get help right away if:  You have very bad back pain.  You have very bad pain in your lower belly.  You have a fever.  You are sick to your stomach (nauseous).  You are throwing up. Summary  A urinary tract infection (UTI) is an infection of any part of the urinary tract.  This condition is caused by germs in your genital area.  There are many risk factors for a UTI. These include having a small, thin   tube to drain pee and not being able to control when you pee or poop.  Treatment includes antibiotic medicines for germs.  Drink enough fluid to keep your pee pale yellow. This information is not intended to replace advice given to you by your health care provider. Make sure you discuss any questions you have with your health care provider. Document Revised: 01/24/2018 Document Reviewed: 08/16/2017 Elsevier Patient Education  2020 Elsevier Inc.  

## 2020-02-10 NOTE — Progress Notes (Signed)
Acute Office Visit  Subjective:    Patient ID: Sarah [name], female    DOB: 07-03-1940, 79 y.o.   MRN: [id number]  Chief Complaint  Patient presents with  . Urinary Frequency  . Urinary Incontinence    Urine frequency, dysuria, incontinence more feeling lots of pressure symptoms x 4 days.     HPI Patient is in today with c/o urinary frequency, urgency, burning, incontinence hat as worsened over the last 4 days. She has a history of UTIs with a resistance to Cipro and an intolerance to a lot of other antibiotics.   Past Medical History:  Diagnosis Date  . Allergy   . CLL (chronic lymphocytic leukemia) ([place])   . GERD (gastroesophageal reflux disease)   . Gout   . HLD (hyperlipidemia)   . Hypertension     Past Surgical History:  Procedure Laterality Date  . ABDOMINAL HYSTERECTOMY    . APPENDECTOMY    . CHOLECYSTECTOMY    . KIDNEY SURGERY    . MOHS SURGERY      Family History  Problem Relation Age of Onset  . Colon cancer Mother   . Heart attack Father     Social History   Socioeconomic History  . Marital status: Widowed    Spouse name: Not on file  . Number of children: Not on file  . Years of education: Not on file  . Highest education level: Not on file  Occupational History  . Not on file  Tobacco Use  . Smoking status: Never Smoker  . Smokeless tobacco: Never Used  Vaping Use  . Vaping Use: Never used  Substance and Sexual Activity  . Alcohol use: Not Currently    Comment: Rare social drinking  . Drug use: Never  . Sexual activity: Not Currently  Other Topics Concern  . Not on file  Social History Narrative  . Not on file   Social Determinants of Health   Financial Resource Strain: Low Risk   . Difficulty of Paying Living Expenses: Not hard at all  Food Insecurity: No Food Insecurity  . Worried About Charity fundraiser in the Last Year: Never true  . Ran Out of Food in the Last Year: Never true  Transportation Needs: No Transportation  Needs  . Lack of Transportation (Medical): No  . Lack of Transportation (Non-Medical): No  Physical Activity: Inactive  . Days of Exercise per Week: 0 days  . Minutes of Exercise per Session: 0 min  Stress: No Stress Concern Present  . Feeling of Stress : Not at all  Social Connections: Moderately Isolated  . Frequency of Communication with Friends and Family: More than three times a week  . Frequency of Social Gatherings with Friends and Family: Once a week  . Attends Religious Services: 1 to 4 times per year  . Active Member of Clubs or Organizations: No  . Attends Archivist Meetings: Never  . Marital Status: Widowed  Intimate Partner Violence: Not At Risk  . Fear of Current or Ex-Partner: No  . Emotionally Abused: No  . Physically Abused: No  . Sexually Abused: No    Outpatient Medications Prior to Visit  Medication Sig Dispense Refill  . acetaminophen (TYLENOL) 500 MG tablet Take 500 mg by mouth every 6 (six) hours as needed.    . busPIRone (BUSPAR) 15 MG tablet Take 1 tablet (15 mg total) by mouth 2 (two) times daily. 180 tablet 1  . Cranberry Extract 250 MG TABS Take  500 mg by mouth.    . ezetimibe (ZETIA) 10 MG tablet Take 10 mg by mouth daily.    . fluticasone (FLONASE) 50 MCG/ACT nasal spray Place 2 sprays into both nostrils daily. 16 g 6  . glucosamine-chondroitin 500-400 MG tablet Take 1 tablet by mouth daily.    Marland Kitchen ibuprofen (ADVIL) 200 MG tablet Take 400 mg by mouth 3 (three) times daily.    . lansoprazole (PREVACID) 30 MG capsule Take 30 mg by mouth daily at 12 noon.    . loratadine (CLARITIN) 10 MG tablet Take 10 mg by mouth daily as needed for allergies.    . Multiple Vitamins-Minerals (EQ MULTIVITAMINS ADULT GUMMY) CHEW Chew by mouth.    . potassium chloride (KLOR-CON) 10 MEQ tablet Take 10 mEq by mouth daily.    Marland Kitchen venlafaxine XR (EFFEXOR-XR) 150 MG 24 hr capsule Take 150 mg by mouth daily with breakfast.    . allopurinol (ZYLOPRIM) 100 MG tablet Take  100 mg by mouth daily. 1/2 tablet daily (Patient not taking: Reported on 02/10/2020)    . guaifenesin (HUMIBID E) 400 MG TABS tablet Take 400 mg by mouth every 4 (four) hours.    Marland Kitchen loperamide (IMODIUM) 2 MG capsule Take by mouth as needed for diarrhea or loose stools. (Patient not taking: Reported on 02/10/2020)    . Melatonin 5 MG CHEW Chew by mouth.  (Patient not taking: Reported on 02/10/2020)    . phenazopyridine (PYRIDIUM) 95 MG tablet Take 95 mg by mouth 3 (three) times daily as needed for pain.  (Patient not taking: No sig reported)    . ciprofloxacin (CIPRO) 500 MG tablet Take 1 tablet (500 mg total) by mouth every 12 (twelve) hours. (Patient not taking: Reported on 02/10/2020) 10 tablet 0   No facility-administered medications prior to visit.    Allergies  Allergen Reactions  . Amoxicillin   . Augmentin [Amoxicillin-Pot Clavulanate]     GI upset   . Drug [Tape]   . Sulfa Antibiotics     Childhood   . Atenolol Rash  . Macrodantin [Nitrofurantoin] Rash    Review of Systems  Constitutional: Negative.   Respiratory: Negative.   Cardiovascular: Negative.   Genitourinary: Positive for dysuria, frequency and urgency.  Musculoskeletal: Negative.   Skin: Negative.   Allergic/Immunologic: Negative.   Psychiatric/Behavioral: Negative.   All other systems reviewed and are negative.      Objective:    Physical Exam Vitals reviewed.  Constitutional:      Appearance: Normal appearance.  Cardiovascular:     Pulses: Normal pulses.     Heart sounds: Normal heart sounds.  Pulmonary:     Effort: Pulmonary effort is normal.     Breath sounds: Normal breath sounds.  Abdominal:     General: Abdomen is flat.     Palpations: Abdomen is soft.  Musculoskeletal:        General: Normal range of motion.     Cervical back: Normal range of motion.  Skin:    General: Skin is warm and dry.  Neurological:     General: No focal deficit present.     Mental Status: She is alert.   Psychiatric:        Mood and Affect: Mood normal.        Behavior: Behavior normal.     BP 124/76   Pulse 66   Temp (!) 97 F (36.1 C) (Temporal)   Ht 5\' 2"  (1.575 m)   Wt 162 lb 12.8 oz (  73.8 kg)   SpO2 97%   BMI 29.78 kg/m  Wt Readings from Last 3 Encounters:  02/10/20 162 lb 12.8 oz (73.8 kg)  01/05/20 164 lb 4.8 oz (74.5 kg)  12/30/19 165 lb (74.8 kg)    Health Maintenance Due  Topic Date Due  . TETANUS/TDAP  Never done  . DEXA SCAN  Never done  . PNA vac Low Risk Adult (1 of 2 - PCV13) Never done    There are no preventive care reminders to display for this patient.   No results found for: TSH Lab Results  Component Value Date   WBC 34.4 (H) 01/05/2020   HGB 11.4 (L) 01/05/2020   HCT 36.3 01/05/2020   MCV 106.8 (H) 01/05/2020   PLT 161 01/05/2020   Lab Results  Component Value Date   NA 141 01/05/2020   K 4.1 01/05/2020   CO2 23 01/05/2020   GLUCOSE 108 (H) 01/05/2020   BUN 17 01/05/2020   CREATININE 0.96 01/05/2020   BILITOT 0.5 01/05/2020   ALKPHOS 109 01/05/2020   AST 19 01/05/2020   ALT 11 01/05/2020   PROT 5.6 (L) 01/05/2020   ALBUMIN 3.5 01/05/2020   CALCIUM 9.1 01/05/2020   ANIONGAP 8 01/05/2020   GFR 47.38 (L) 12/08/2019   No results found for: CHOL No results found for: HDL No results found for: LDLCALC No results found for: TRIG No results found for: CHOLHDL No results found for: HGBA1C     Assessment & Plan:   Problem List Items Addressed This Visit   None   Visit Diagnoses    Dysuria    -  Primary   Relevant Orders   POC Urinalysis Dipstick (Completed)     Bacterial Urinary Tract Infection  Meds ordered this encounter  Medications  . cephALEXin (KEFLEX) 500 MG capsule    Sig: Take 1 capsule (500 mg total) by mouth 3 (three) times daily.    Dispense:  21 capsule    Refill:  0   Call the office if symptoms worsen or persist.   Kennyth Arnold, FNP

## 2020-02-13 ENCOUNTER — Encounter: Payer: Self-pay | Admitting: Family Medicine

## 2020-04-06 ENCOUNTER — Telehealth: Payer: Self-pay | Admitting: Hematology and Oncology

## 2020-04-06 NOTE — Telephone Encounter (Signed)
Called pt per 2/15 sch msg - no answer and unable to leave message.

## 2020-04-08 ENCOUNTER — Ambulatory Visit: Payer: Medicare Other | Admitting: Family Medicine

## 2020-04-16 ENCOUNTER — Telehealth: Payer: Self-pay | Admitting: Hematology and Oncology

## 2020-04-16 ENCOUNTER — Other Ambulatory Visit: Payer: Self-pay | Admitting: *Deleted

## 2020-04-16 DIAGNOSIS — C9110 Chronic lymphocytic leukemia of B-cell type not having achieved remission: Secondary | ICD-10-CM

## 2020-04-16 NOTE — Telephone Encounter (Signed)
Rescheduled appts to an earlier date per pt's daughter's request. Pt's daughter confirmed new appt date and time. Pt's daughter was concerned of symptoms pt is experiencing. Transferred daughter to desk nurse.

## 2020-04-19 ENCOUNTER — Other Ambulatory Visit: Payer: Self-pay

## 2020-04-19 ENCOUNTER — Inpatient Hospital Stay: Payer: Medicare Other | Attending: Hematology and Oncology

## 2020-04-19 DIAGNOSIS — Z853 Personal history of malignant neoplasm of breast: Secondary | ICD-10-CM | POA: Insufficient documentation

## 2020-04-19 DIAGNOSIS — C9110 Chronic lymphocytic leukemia of B-cell type not having achieved remission: Secondary | ICD-10-CM | POA: Diagnosis not present

## 2020-04-19 LAB — CMP (CANCER CENTER ONLY)
ALT: 18 U/L (ref 0–44)
AST: 22 U/L (ref 15–41)
Albumin: 3.6 g/dL (ref 3.5–5.0)
Alkaline Phosphatase: 148 U/L — ABNORMAL HIGH (ref 38–126)
Anion gap: 10 (ref 5–15)
BUN: 18 mg/dL (ref 8–23)
CO2: 21 mmol/L — ABNORMAL LOW (ref 22–32)
Calcium: 9 mg/dL (ref 8.9–10.3)
Chloride: 109 mmol/L (ref 98–111)
Creatinine: 1.15 mg/dL — ABNORMAL HIGH (ref 0.44–1.00)
GFR, Estimated: 48 mL/min — ABNORMAL LOW (ref >60)
Glucose, Bld: 98 mg/dL (ref 70–99)
Potassium: 4.2 mmol/L (ref 3.5–5.1)
Sodium: 140 mmol/L (ref 135–145)
Total Bilirubin: 0.4 mg/dL (ref 0.3–1.2)
Total Protein: 5.7 g/dL — ABNORMAL LOW (ref 6.5–8.1)

## 2020-04-19 LAB — CBC WITH DIFFERENTIAL (CANCER CENTER ONLY)
Abs Immature Granulocytes: 0.05 10*3/uL (ref 0.00–0.07)
Basophils Absolute: 0.1 10*3/uL (ref 0.0–0.1)
Basophils Relative: 0 %
Eosinophils Absolute: 0.6 10*3/uL — ABNORMAL HIGH (ref 0.0–0.5)
Eosinophils Relative: 1 %
HCT: 35.1 % — ABNORMAL LOW (ref 36.0–46.0)
Hemoglobin: 10.8 g/dL — ABNORMAL LOW (ref 12.0–15.0)
Immature Granulocytes: 0 %
Lymphocytes Relative: 88 %
Lymphs Abs: 41.4 10*3/uL — ABNORMAL HIGH (ref 0.7–4.0)
MCH: 32.6 pg (ref 26.0–34.0)
MCHC: 30.8 g/dL (ref 30.0–36.0)
MCV: 106 fL — ABNORMAL HIGH (ref 80.0–100.0)
Monocytes Absolute: 2 10*3/uL — ABNORMAL HIGH (ref 0.1–1.0)
Monocytes Relative: 4 %
Neutro Abs: 3.2 10*3/uL (ref 1.7–7.7)
Neutrophils Relative %: 7 %
Platelet Count: 157 10*3/uL (ref 150–400)
RBC: 3.31 MIL/uL — ABNORMAL LOW (ref 3.87–5.11)
RDW: 15.7 % — ABNORMAL HIGH (ref 11.5–15.5)
WBC Count: 47.2 10*3/uL — ABNORMAL HIGH (ref 4.0–10.5)
nRBC: 0 % (ref 0.0–0.2)

## 2020-04-19 LAB — URIC ACID: Uric Acid, Serum: 6.5 mg/dL (ref 2.5–7.1)

## 2020-04-19 LAB — LACTATE DEHYDROGENASE: LDH: 203 U/L — ABNORMAL HIGH (ref 98–192)

## 2020-04-25 ENCOUNTER — Other Ambulatory Visit: Payer: Self-pay | Admitting: Hematology and Oncology

## 2020-04-25 DIAGNOSIS — C9110 Chronic lymphocytic leukemia of B-cell type not having achieved remission: Secondary | ICD-10-CM

## 2020-04-25 NOTE — Progress Notes (Signed)
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 No treatment pursued at that time.  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  4) 06/14/15-received R-Benda, but d/c due to poor tolerance 5) 05/06/18-06/26/2018: ibrutinib therapy, d/c due to poor tolerance 6) 09/02/2019: last visit with Dr. Phillip Heal at the [facility] clinic in [place], [place].  6) 10/03/2019: establish care with Dr. 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 female with medical history significant for CLL and breast cancer in remission who presents for a follow up visit. The patient's last visit was on 01/05/2020. In the interim since the last visit Sarah [name] had an ED visit on 01/15/2021 at which time she had a UTI.   On exam today Sarah [name] notes is been in her baseline level of health since her last visit.  She reports that she has been off allopurinol because she had some concerning symptoms including hallucinations and confusion while on this medication.  She reports that she does occasionally have some sweats and she has persistent weakness and decreased endurance.  She reports that she does get exhausted very easily and frequently has to take a nap after taking a shower.  She also reports that she cannot stand for any length of time.    On further discussion she recently had a runny nose and sore throat as well as 3 urinary tract infections.   She does appear prone to infections.  She does have diffuse joint pain particularly in the morning.  She denies having any lymphadenopathy, abdominal swelling, or decrease in appetite.  She otherwise denies having any issues today with fevers, chills, sweats, nausea, vomiting or diarrhea.  Her weight has been stable.  A full 10 point ROS is listed below.  MEDICAL HISTORY:  Past Medical History:  Diagnosis Date  . Allergy   . CLL (chronic lymphocytic leukemia) ([place])   . GERD (gastroesophageal reflux disease)   . Gout   . HLD (hyperlipidemia)   . Hypertension     SURGICAL HISTORY: Past Surgical History:  Procedure Laterality Date  . ABDOMINAL HYSTERECTOMY    . APPENDECTOMY    . CHOLECYSTECTOMY    . KIDNEY SURGERY    . MOHS SURGERY      SOCIAL HISTORY: Social History   Socioeconomic History  . Marital status: Widowed    Spouse name: Not on file  . Number of children: Not on file  . Years of education: Not on file  . Highest education level: Not on file  Occupational History  . Not on file  Tobacco Use  . Smoking status: Never Smoker  . Smokeless tobacco: Never Used  Vaping Use  . Vaping Use: Never used  Substance and Sexual Activity  . Alcohol use: Not Currently    Comment: Rare social drinking  . Drug use: Never  . Sexual activity: Not Currently  Other Topics Concern  . Not on file  Social History Narrative  . Not  on file   Social Determinants of Health   Financial Resource Strain: Low Risk   . Difficulty of Paying Living Expenses: Not hard at all  Food Insecurity: No Food Insecurity  . Worried About Charity fundraiser in the Last Year: Never true  . Ran Out of Food in the Last Year: Never true  Transportation Needs: No Transportation Needs  . Lack of Transportation (Medical): No  . Lack of Transportation (Non-Medical): No  Physical Activity: Inactive  . Days of Exercise per Week: 0 days  . Minutes of Exercise per Session: 0 min  Stress: No Stress Concern  Present  . Feeling of Stress : Not at all  Social Connections: Moderately Isolated  . Frequency of Communication with Friends and Family: More than three times a week  . Frequency of Social Gatherings with Friends and Family: Once a week  . Attends Religious Services: 1 to 4 times per year  . Active Member of Clubs or Organizations: No  . Attends Archivist Meetings: Never  . Marital Status: Widowed  Intimate Partner Violence: Not At Risk  . Fear of Current or Ex-Partner: No  . Emotionally Abused: No  . Physically Abused: No  . Sexually Abused: No    FAMILY HISTORY: Family History  Problem Relation Age of Onset  . Colon cancer Mother   . Heart attack Father     ALLERGIES:  is allergic to amoxicillin, augmentin [amoxicillin-pot clavulanate], drug [tape], sulfa antibiotics, atenolol, and macrodantin [nitrofurantoin].  MEDICATIONS:  Current Outpatient Medications  Medication Sig Dispense Refill  . metoprolol-hydrochlorothiazide (LOPRESSOR HCT) 100-25 MG tablet Take 1 tablet by mouth daily. Takes 1/2 tablet daily    . Sulfamethoxazole-Trimethoprim (BACTRIM DS PO) Take 100 mg by mouth at bedtime.    Marland Kitchen acetaminophen (TYLENOL) 500 MG tablet Take 500 mg by mouth every 6 (six) hours as needed.    . busPIRone (BUSPAR) 15 MG tablet Take 1 tablet (15 mg total) by mouth 2 (two) times daily. 180 tablet 1  . Cranberry Extract 250 MG TABS Take 500 mg by mouth.    . ezetimibe (ZETIA) 10 MG tablet Take 10 mg by mouth daily.    . fluticasone (FLONASE) 50 MCG/ACT nasal spray Place 2 sprays into both nostrils daily. 16 g 6  . glucosamine-chondroitin 500-400 MG tablet Take 1 tablet by mouth daily.    Marland Kitchen guaifenesin (HUMIBID E) 400 MG TABS tablet Take 400 mg by mouth every 4 (four) hours.    Marland Kitchen ibuprofen (ADVIL) 200 MG tablet Take 400 mg by mouth 3 (three) times daily.    . lansoprazole (PREVACID) 30 MG capsule Take 30 mg by mouth daily at 12 noon.    . loperamide (IMODIUM) 2 MG capsule Take  by mouth as needed for diarrhea or loose stools. (Patient not taking: Reported on 02/10/2020)    . loratadine (CLARITIN) 10 MG tablet Take 10 mg by mouth daily as needed for allergies.    . Melatonin 5 MG CHEW Chew by mouth.  (Patient not taking: Reported on 02/10/2020)    . Multiple Vitamins-Minerals (EQ MULTIVITAMINS ADULT GUMMY) CHEW Chew by mouth.    . phenazopyridine (PYRIDIUM) 95 MG tablet Take 95 mg by mouth 3 (three) times daily as needed for pain.  (Patient not taking: No sig reported)    . potassium chloride (KLOR-CON) 10 MEQ tablet Take 10 mEq by mouth daily.    Marland Kitchen venlafaxine XR (EFFEXOR-XR) 150 MG 24 hr capsule Take 150 mg by mouth  daily with breakfast.     No current facility-administered medications for this visit.    REVIEW OF SYSTEMS:   Constitutional: ( - ) fevers, ( - )  chills , ( - ) night sweats Eyes: ( - ) blurriness of vision, ( - ) double vision, ( - ) watery eyes Ears, nose, mouth, throat, and face: ( - ) mucositis, ( - ) sore throat Respiratory: ( - ) cough, ( - ) dyspnea, ( - ) wheezes Cardiovascular: ( - ) palpitation, ( - ) chest discomfort, ( - ) lower extremity swelling Gastrointestinal:  ( - ) nausea, ( - ) heartburn, ( - ) change in bowel habits Skin: ( - ) abnormal skin rashes Lymphatics: ( - ) new lymphadenopathy, ( - ) easy bruising Neurological: ( - ) numbness, ( - ) tingling, ( - ) new weaknesses Behavioral/Psych: ( - ) mood change, ( - ) new changes  All other systems were reviewed with the patient and are negative.  PHYSICAL EXAMINATION: ECOG PERFORMANCE STATUS: 3 - Symptomatic, >50% confined to bed  Vitals:   04/26/20 1528  BP: 131/83  Pulse: 65  Resp: 18  Temp: 97.6 F (36.4 C)  SpO2: 99%   Filed Weights   04/26/20 1528  Weight: 161 lb 8 oz (73.3 kg)    GENERAL: chronically ill appearing elderly Caucasian female in NAD  SKIN: skin color, texture, turgor are normal, no rashes or significant lesions EYES: conjunctiva are pink and  non-injected, sclera clear LUNGS: clear to auscultation and percussion with normal breathing effort HEART: regular rate & rhythm and no murmurs and no lower extremity edema ABDOMEN: soft, non-tender, non-distended, normal bowel sounds Musculoskeletal: no cyanosis of digits and no clubbing  PSYCH: alert & oriented x 3, fluent speech NEURO: no focal motor/sensory deficits  LABORATORY DATA:  I have reviewed the data as listed CBC Latest Ref Rng & Units 04/19/2020 01/05/2020 12/08/2019  WBC 4.0 - 10.5 K/uL 47.2(H) 34.4(H) 60.4 Repeated and verified X2.(HH)  Hemoglobin 12.0 - 15.0 g/dL 10.8(L) 11.4(L) 12.3  Hematocrit 36.0 - 46.0 % 35.1(L) 36.3 38.9  Platelets 150 - 400 K/uL 157 161 234.0    CMP Latest Ref Rng & Units 04/19/2020 01/05/2020 12/08/2019  Glucose 70 - 99 mg/dL 98 108(H) 93  BUN 8 - 23 mg/dL _0 Creatinine 0.44 - 1.00 mg/dL 1.15(H) 0.96 1.10  Sodium 135 - 145 mmol/L 140 141 139  Potassium 3.5 - 5.1 mmol/L 4.2 4.1 4.3  Chloride 98 - 111 mmol/L 109 110 105  CO2 22 - 32 mmol/L 21(L) 23 23  Calcium 8.9 - 10.3 mg/dL 9.0 9.1 9.1  Total Protein 6.5 - 8.1 g/dL 5.7(L) 5.6(L) 5.9(L)  Total Bilirubin 0.3 - 1.2 mg/dL 0.4 0.5 0.5  Alkaline Phos 38 - 126 U/L 148(H) 109 139(H)  AST 15 - 41 U/L _1 ALT 0 - 44 U/L _2 RADIOGRAPHIC STUDIES: No results found.  ASSESSMENT & PLAN Sarah [name] 80 y.o. female with medical history significant for CLL and breast cancer in remission who presents for a follow up visit.  Review the labs, the records, and discussion with the patient the findings are most consistent with a diagnosis of CLL that does not currently require treatment.  She has no palpable lymphadenopathy and no evidence of anemia and thrombocytopenia.  As such I do believe would be appropriate to continue observation at this time.  She has been attempted on therapy as  before with ibrutinib and rituximab bendamustine but is not been able to tolerate these treatments.   As such I do believe that observation alone with discussion of comfort based care if the patient were to have progression.  We will plan to see the patient back in approximately 3 months time in order to reassess.  #Chronic Lymphocytic Leukemia --based on prior labs and studies the patient has a confirmed diagnosis of CLL.  --will order CBC, CMP, LDH today --patient has attempted prior therapy with ibrutinib and R-Benda but not been able to tolerate thearpy.  --no record of lymphadenopathy or splenomegaly. Patient does not have anemia/thrombocytopenia on labs  --RTC in 3 months or sooner if treatment is indicated/more evaluation is required.   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 --continue yearly mammograms   No orders of the defined types were placed in this encounter.  All questions were answered. The patient knows to call the clinic with any problems, questions or concerns.  A total of more than 30 minutes were spent on this encounter and over half of that time was spent on counseling and coordination of care as outlined above.   Ledell Peoples, MD Department of Hematology/Oncology [place] at [facility] Phone: [phone] Pager: [phone] Email: [email or web address]  04/26/2020 4:02 PM

## 2020-04-26 ENCOUNTER — Inpatient Hospital Stay: Payer: Medicare Other | Attending: Hematology and Oncology | Admitting: Hematology and Oncology

## 2020-04-26 ENCOUNTER — Other Ambulatory Visit: Payer: Self-pay

## 2020-04-26 ENCOUNTER — Other Ambulatory Visit: Payer: Medicare Other

## 2020-04-26 VITALS — BP 131/83 | HR 65 | Temp 97.6°F | Resp 18 | Ht 62.0 in | Wt 161.5 lb

## 2020-04-26 DIAGNOSIS — C8590 Non-Hodgkin lymphoma, unspecified, unspecified site: Secondary | ICD-10-CM | POA: Diagnosis present

## 2020-04-26 DIAGNOSIS — C9110 Chronic lymphocytic leukemia of B-cell type not having achieved remission: Secondary | ICD-10-CM

## 2020-04-26 DIAGNOSIS — Z853 Personal history of malignant neoplasm of breast: Secondary | ICD-10-CM | POA: Diagnosis not present

## 2020-04-28 ENCOUNTER — Telehealth: Payer: Self-pay | Admitting: Hematology and Oncology

## 2020-04-28 NOTE — Telephone Encounter (Signed)
Scheduled per los. Called and spoke with patient. Confirmed appt 

## 2020-05-05 ENCOUNTER — Ambulatory Visit: Payer: Medicare Other | Admitting: Hematology and Oncology

## 2020-05-05 ENCOUNTER — Other Ambulatory Visit: Payer: Medicare Other

## 2020-05-06 ENCOUNTER — Encounter (HOSPITAL_BASED_OUTPATIENT_CLINIC_OR_DEPARTMENT_OTHER): Payer: Self-pay | Admitting: *Deleted

## 2020-05-06 ENCOUNTER — Emergency Department (HOSPITAL_BASED_OUTPATIENT_CLINIC_OR_DEPARTMENT_OTHER): Payer: Medicare Other

## 2020-05-06 ENCOUNTER — Inpatient Hospital Stay (HOSPITAL_BASED_OUTPATIENT_CLINIC_OR_DEPARTMENT_OTHER)
Admission: EM | Admit: 2020-05-06 | Discharge: 2020-05-14 | DRG: 854 | Disposition: A | Discharge status: Home Health | Payer: Medicare Other | Attending: Internal Medicine | Admitting: Internal Medicine

## 2020-05-06 ENCOUNTER — Other Ambulatory Visit: Payer: Self-pay

## 2020-05-06 ENCOUNTER — Ambulatory Visit
Admission: RE | Admit: 2020-05-06 | Discharge: 2020-05-06 | Disposition: A | Discharge status: Home / Self Care | Payer: Medicare Other | Source: Ambulatory Visit

## 2020-05-06 VITALS — BP 89/59 | HR 68 | Temp 97.6°F | Resp 18

## 2020-05-06 DIAGNOSIS — C9110 Chronic lymphocytic leukemia of B-cell type not having achieved remission: Secondary | ICD-10-CM | POA: Diagnosis present

## 2020-05-06 DIAGNOSIS — Z8249 Family history of ischemic heart disease and other diseases of the circulatory system: Secondary | ICD-10-CM

## 2020-05-06 DIAGNOSIS — I959 Hypotension, unspecified: Secondary | ICD-10-CM | POA: Diagnosis present

## 2020-05-06 DIAGNOSIS — Z880 Allergy status to penicillin: Secondary | ICD-10-CM

## 2020-05-06 DIAGNOSIS — M109 Gout, unspecified: Secondary | ICD-10-CM | POA: Diagnosis not present

## 2020-05-06 DIAGNOSIS — N179 Acute kidney failure, unspecified: Secondary | ICD-10-CM | POA: Diagnosis present

## 2020-05-06 DIAGNOSIS — Z87440 Personal history of urinary (tract) infections: Secondary | ICD-10-CM

## 2020-05-06 DIAGNOSIS — Z66 Do not resuscitate: Secondary | ICD-10-CM | POA: Diagnosis not present

## 2020-05-06 DIAGNOSIS — N136 Pyonephrosis: Secondary | ICD-10-CM | POA: Diagnosis not present

## 2020-05-06 DIAGNOSIS — I252 Old myocardial infarction: Secondary | ICD-10-CM | POA: Diagnosis not present

## 2020-05-06 DIAGNOSIS — E785 Hyperlipidemia, unspecified: Secondary | ICD-10-CM | POA: Diagnosis present

## 2020-05-06 DIAGNOSIS — K219 Gastro-esophageal reflux disease without esophagitis: Secondary | ICD-10-CM | POA: Diagnosis not present

## 2020-05-06 DIAGNOSIS — E860 Dehydration: Secondary | ICD-10-CM | POA: Diagnosis not present

## 2020-05-06 DIAGNOSIS — I10 Essential (primary) hypertension: Secondary | ICD-10-CM | POA: Diagnosis present

## 2020-05-06 DIAGNOSIS — Z792 Long term (current) use of antibiotics: Secondary | ICD-10-CM

## 2020-05-06 DIAGNOSIS — E7800 Pure hypercholesterolemia, unspecified: Secondary | ICD-10-CM | POA: Diagnosis present

## 2020-05-06 DIAGNOSIS — R531 Weakness: Secondary | ICD-10-CM

## 2020-05-06 DIAGNOSIS — Z419 Encounter for procedure for purposes other than remedying health state, unspecified: Secondary | ICD-10-CM

## 2020-05-06 DIAGNOSIS — D638 Anemia in other chronic diseases classified elsewhere: Secondary | ICD-10-CM | POA: Diagnosis present

## 2020-05-06 DIAGNOSIS — Z881 Allergy status to other antibiotic agents status: Secondary | ICD-10-CM

## 2020-05-06 DIAGNOSIS — Z882 Allergy status to sulfonamides status: Secondary | ICD-10-CM

## 2020-05-06 DIAGNOSIS — E872 Acidosis: Secondary | ICD-10-CM | POA: Diagnosis not present

## 2020-05-06 DIAGNOSIS — N3000 Acute cystitis without hematuria: Secondary | ICD-10-CM | POA: Diagnosis present

## 2020-05-06 DIAGNOSIS — Z20822 Contact with and (suspected) exposure to covid-19: Secondary | ICD-10-CM | POA: Diagnosis present

## 2020-05-06 DIAGNOSIS — R5383 Other fatigue: Secondary | ICD-10-CM

## 2020-05-06 DIAGNOSIS — A419 Sepsis, unspecified organism: Secondary | ICD-10-CM | POA: Diagnosis not present

## 2020-05-06 DIAGNOSIS — Z853 Personal history of malignant neoplasm of breast: Secondary | ICD-10-CM

## 2020-05-06 DIAGNOSIS — Z888 Allergy status to other drugs, medicaments and biological substances status: Secondary | ICD-10-CM

## 2020-05-06 DIAGNOSIS — N390 Urinary tract infection, site not specified: Secondary | ICD-10-CM

## 2020-05-06 DIAGNOSIS — Z800 Family history of malignant neoplasm of digestive organs: Secondary | ICD-10-CM

## 2020-05-06 DIAGNOSIS — N12 Tubulo-interstitial nephritis, not specified as acute or chronic: Secondary | ICD-10-CM

## 2020-05-06 DIAGNOSIS — F32A Depression, unspecified: Secondary | ICD-10-CM | POA: Diagnosis present

## 2020-05-06 DIAGNOSIS — Z79899 Other long term (current) drug therapy: Secondary | ICD-10-CM

## 2020-05-06 DIAGNOSIS — J069 Acute upper respiratory infection, unspecified: Secondary | ICD-10-CM

## 2020-05-06 HISTORY — DX: Urinary tract infection, site not specified: N39.0

## 2020-05-06 HISTORY — DX: Hypotension, unspecified: I95.9

## 2020-05-06 HISTORY — DX: Acute kidney failure, unspecified: N17.9

## 2020-05-06 LAB — COMPREHENSIVE METABOLIC PANEL
ALT: 24 U/L (ref 0–44)
AST: 25 U/L (ref 15–41)
Albumin: 3.7 g/dL (ref 3.5–5.0)
Alkaline Phosphatase: 171 U/L — ABNORMAL HIGH (ref 38–126)
Anion gap: 10 (ref 5–15)
BUN: 31 mg/dL — ABNORMAL HIGH (ref 8–23)
CO2: 20 mmol/L — ABNORMAL LOW (ref 22–32)
Calcium: 8.4 mg/dL — ABNORMAL LOW (ref 8.9–10.3)
Chloride: 107 mmol/L (ref 98–111)
Creatinine, Ser: 1.41 mg/dL — ABNORMAL HIGH (ref 0.44–1.00)
GFR, Estimated: 38 mL/min — ABNORMAL LOW (ref >60)
Glucose, Bld: 125 mg/dL — ABNORMAL HIGH (ref 70–99)
Potassium: 4.6 mmol/L (ref 3.5–5.1)
Sodium: 137 mmol/L (ref 135–145)
Total Bilirubin: 0.4 mg/dL (ref 0.3–1.2)
Total Protein: 5.7 g/dL — ABNORMAL LOW (ref 6.5–8.1)

## 2020-05-06 LAB — CBC WITH DIFFERENTIAL/PLATELET
Abs Immature Granulocytes: 0.06 10*3/uL (ref 0.00–0.07)
Basophils Absolute: 0 10*3/uL (ref 0.0–0.1)
Basophils Relative: 0 %
Eosinophils Absolute: 0.3 10*3/uL (ref 0.0–0.5)
Eosinophils Relative: 1 %
HCT: 32.6 % — ABNORMAL LOW (ref 36.0–46.0)
Hemoglobin: 10 g/dL — ABNORMAL LOW (ref 12.0–15.0)
Immature Granulocytes: 0 %
Lymphocytes Relative: 89 %
Lymphs Abs: 36.4 10*3/uL — ABNORMAL HIGH (ref 0.7–4.0)
MCH: 32.2 pg (ref 26.0–34.0)
MCHC: 30.7 g/dL (ref 30.0–36.0)
MCV: 104.8 fL — ABNORMAL HIGH (ref 80.0–100.0)
Monocytes Absolute: 0.5 10*3/uL (ref 0.1–1.0)
Monocytes Relative: 1 %
Neutro Abs: 3.7 10*3/uL (ref 1.7–7.7)
Neutrophils Relative %: 9 %
Platelets: 214 10*3/uL (ref 150–400)
RBC: 3.11 MIL/uL — ABNORMAL LOW (ref 3.87–5.11)
RDW: 15.5 % (ref 11.5–15.5)
WBC: 41 10*3/uL — ABNORMAL HIGH (ref 4.0–10.5)
nRBC: 0 % (ref 0.0–0.2)

## 2020-05-06 LAB — URINALYSIS, ROUTINE W REFLEX MICROSCOPIC
Bilirubin Urine: NEGATIVE
Glucose, UA: NEGATIVE mg/dL
Hgb urine dipstick: NEGATIVE
Ketones, ur: NEGATIVE mg/dL
Nitrite: NEGATIVE
Protein, ur: 30 mg/dL — AB
Specific Gravity, Urine: 1.02 (ref 1.005–1.030)
WBC, UA: >50 WBC/hpf — ABNORMAL HIGH (ref 0–5)
pH: 5.5 (ref 5.0–8.0)

## 2020-05-06 LAB — RESP PANEL BY RT-PCR (FLU A&B, COVID) ARPGX2
Influenza A by PCR: NEGATIVE
Influenza B by PCR: NEGATIVE
SARS Coronavirus 2 by RT PCR: NEGATIVE

## 2020-05-06 LAB — LACTIC ACID, PLASMA
Lactic Acid, Venous: 1.1 mmol/L (ref 0.5–1.9)
Lactic Acid, Venous: 1.1 mmol/L (ref 0.5–1.9)

## 2020-05-06 MED ORDER — ENSURE ENLIVE PO LIQD
237.0000 mL | Freq: Two times a day (BID) | ORAL | Status: DC
  Administered 2020-05-07 – 2020-05-14 (×9): 237 mL via ORAL

## 2020-05-06 MED ORDER — ACETAMINOPHEN 325 MG PO TABS
650.0000 mg | ORAL_TABLET | Freq: Four times a day (QID) | ORAL | Status: DC | PRN
  Administered 2020-05-06 – 2020-05-14 (×13): 650 mg via ORAL
  Filled 2020-05-06 (×13): qty 2

## 2020-05-06 MED ORDER — ENOXAPARIN SODIUM 30 MG/0.3ML ~~LOC~~ SOLN
30.0000 mg | SUBCUTANEOUS | Status: DC
  Administered 2020-05-06 – 2020-05-09 (×4): 30 mg via SUBCUTANEOUS
  Filled 2020-05-06 (×4): qty 0.3

## 2020-05-06 MED ORDER — SODIUM CHLORIDE 0.9 % IV BOLUS
500.0000 mL | Freq: Once | INTRAVENOUS | Status: AC
  Administered 2020-05-06: 500 mL via INTRAVENOUS

## 2020-05-06 MED ORDER — SODIUM CHLORIDE 0.9 % IV SOLN: 1.0000 g | INTRAVENOUS | Status: DC

## 2020-05-06 MED ORDER — ACETAMINOPHEN 650 MG RE SUPP: 650.0000 mg | Freq: Four times a day (QID) | RECTAL | Status: DC | PRN

## 2020-05-06 MED ORDER — LACTATED RINGERS IV SOLN: INTRAVENOUS | Status: DC

## 2020-05-06 MED ORDER — SODIUM CHLORIDE 0.9 % IV SOLN
1.0000 g | Freq: Once | INTRAVENOUS | Status: AC
  Administered 2020-05-06: 1 g via INTRAVENOUS
  Filled 2020-05-06: qty 10

## 2020-05-06 NOTE — ED Triage Notes (Signed)
URI with fever (tmax 99.9), nasal congestion and cough x 1-2 weeks. Pt went to urgent care today and was advised to come to the ED for eval of hypotension. Ambulatory a short distance. Pt on chronic antibiotic for UTIs

## 2020-05-06 NOTE — ED Triage Notes (Signed)
Pt presents with c/o body aches , chills and low grade fever with cough and runny nose since Monday

## 2020-05-06 NOTE — ED Notes (Signed)
Second set of blood cultures drawn at 1650 (right [facility]).

## 2020-05-06 NOTE — ED Notes (Signed)
Attempted to call report

## 2020-05-06 NOTE — Discharge Instructions (Signed)
I would have you follow up in the ER given her complex medical history, low blood pressure, and limited fluid intake

## 2020-05-06 NOTE — H&P (Signed)
History and Physical    Sarah [name] PHX:[id number] DOB: 01-Nov-1940 DOA: 05/06/2020  PCP: Libby Maw, MD Patient coming from: [facility]  Chief Complaint: Chills  HPI: Sarah [name] is a 80 y.o. 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  Patient was hypotensive at urgent care with systolic 89 and sent to [facility] ED.  Blood pressure soft in the ED. Not febrile, tachycardic, or hypoxic.  Labs showing WBC 41.0 with lymphocytosis (chronically elevated given history of CLL), hemoglobin 10.0 (was 10.8 on 04/19/2020), platelet count 214K.  Sodium 137, potassium 4.6, chloride 107, bicarb 20, anion gap 10, BUN 31, creatinine 1.4 (baseline 0.9), glucose 125.  Alk phos chronically elevated, remainder of LFTs normal.  Lactic acid 1.1.  UA with negative nitrite, large amount of leukocytes, greater than 50 WBCs, and few bacteria.  Urine culture pending.  SARS-CoV-2 PCR test negative.  Influenza panel negative.  Blood culture x2 pending.  Chest x-ray showing no active cardiopulmonary disease. Patient was given ceftriaxone and a 500 cc normal saline bolus.  Patient reports 3 day history of chills, night sweats, rhinorrhea, cough, shortness of breath, fatigue, and poor appetite.  States she normally gets night sweats due to CLL but much worse now.  Her temperature has been running between 98-99 degrees Fahrenheit at home.  Denies chest pain, nausea, vomiting, abdominal pain, or diarrhea.  Patient states she has had 3 UTIs in the past 4 months and was seen by a urologist and started on Bactrim.  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  Denies dysuria. She is vaccinated against COVID including booster shot.  She has also received flu vaccine this season.    Review of Systems:  All systems reviewed and apart from history of presenting  illness, are negative.  Past Medical History:  Diagnosis Date  . Allergy   . CLL (chronic lymphocytic leukemia) ([place])   . GERD (gastroesophageal reflux disease)   . Gout   . HLD (hyperlipidemia)   . Hypertension     Past Surgical History:  Procedure Laterality Date  . ABDOMINAL HYSTERECTOMY    . APPENDECTOMY    . CHOLECYSTECTOMY    . KIDNEY SURGERY    . MOHS SURGERY       reports that she has never smoked. She has never used smokeless tobacco. She reports previous alcohol use. She reports that she does not use drugs.  Allergies  Allergen Reactions  . Amoxicillin   . Augmentin [Amoxicillin-Pot Clavulanate]     GI upset   . Drug [Tape]   . Sulfa Antibiotics     Childhood   . Atenolol Rash  . Macrodantin [Nitrofurantoin] Rash    Family History  Problem Relation Age of Onset  . Colon cancer Mother   . Heart attack Father     Prior to Admission medications   Medication Sig Start Date End Date Taking? Authorizing Provider  acetaminophen (TYLENOL) 500 MG tablet Take 500 mg by mouth every 6 (six) hours as needed.    [provider]  busPIRone (BUSPAR) 15 MG tablet Take 1 tablet (15 mg total) by mouth 2 (two) times daily. 01/08/20   Kennyth Arnold, FNP  Cranberry Extract 250 MG TABS Take 500 mg by mouth.    [provider]  ezetimibe (ZETIA) 10 MG tablet Take 10 mg by mouth daily.    [provider]  fluticasone (FLONASE) 50 MCG/ACT nasal spray Place 2 sprays into both nostrils daily. 12/08/19   Libby Maw, MD  glucosamine-chondroitin 500-400 MG tablet Take 1 tablet by mouth daily.    [provider]  guaifenesin (HUMIBID E) 400 MG TABS tablet Take 400 mg by mouth every 4 (four) hours.    [provider]  ibuprofen (ADVIL) 200 MG tablet Take 400 mg by mouth 3 (three) times daily.    [provider]  lansoprazole (PREVACID) 30 MG capsule Take 30 mg by mouth daily at 12 noon.    [provider]   loperamide (IMODIUM) 2 MG capsule Take by mouth as needed for diarrhea or loose stools. Patient not taking: Reported on 02/10/2020    [provider]  loratadine (CLARITIN) 10 MG tablet Take 10 mg by mouth daily as needed for allergies.    [provider]  Melatonin 5 MG CHEW Chew by mouth.  Patient not taking: Reported on 02/10/2020    [provider]  metoprolol-hydrochlorothiazide (LOPRESSOR HCT) 100-25 MG tablet Take 1 tablet by mouth daily. Takes 1/2 tablet daily    [provider]  Multiple Vitamins-Minerals (EQ MULTIVITAMINS ADULT GUMMY) CHEW Chew by mouth.    [provider]  phenazopyridine (PYRIDIUM) 95 MG tablet Take 95 mg by mouth 3 (three) times daily as needed for pain.  Patient not taking: No sig reported    [provider]  potassium chloride (KLOR-CON) 10 MEQ tablet Take 10 mEq by mouth daily.    [provider]  Sulfamethoxazole-Trimethoprim (BACTRIM DS PO) Take 100 mg by mouth at bedtime.    [provider]  trimethoprim (TRIMPEX) 100 MG tablet Take 100 mg by mouth at bedtime.    [provider]  venlafaxine XR (EFFEXOR-XR) 150 MG 24 hr capsule Take 150 mg by mouth daily with breakfast.    [provider]  metoprolol tartrate (LOPRESSOR) 25 MG tablet Take 25 mg by mouth 2 (two) times daily.  01/05/20  [provider]    Physical Exam: Vitals:   05/06/20 1630 05/06/20 1700 05/06/20 1730 05/06/20 1800  BP: 105/65 108/70 96/64 97/64   Pulse: 66 68 69 71  Resp: 18 (!) 24 (!) 22 20  Temp:      TempSrc:      SpO2: 99% 100% 100% 100%  Weight:      Height:        Physical Exam Constitutional:      General: She is not in acute distress. HENT:     Head: Normocephalic and atraumatic.  Eyes:     Extraocular Movements: Extraocular movements intact.     Conjunctiva/sclera: Conjunctivae normal.  Cardiovascular:     Rate and Rhythm: Normal rate and regular rhythm.     Pulses:  Normal pulses.  Pulmonary:     Effort: Pulmonary effort is normal. No respiratory distress.     Breath sounds: Normal breath sounds. No wheezing or rales.  Abdominal:     General: Bowel sounds are normal. There is no distension.     Palpations: Abdomen is soft.     Tenderness: There is no abdominal tenderness.  Musculoskeletal:        General: No swelling or tenderness.     Cervical back: Normal range of motion and neck supple.  Skin:    General: Skin is warm and dry.  Neurological:     General: No focal deficit present.     Mental Status: She is alert  and oriented to person, place, and time.     Labs on Admission: I have personally reviewed following labs and imaging studies  CBC: Recent Labs  Lab 05/06/20 1518  WBC 41.0*  NEUTROABS 3.7  HGB 10.0*  HCT 32.6*  MCV 104.8*  PLT 491   Basic Metabolic Panel: Recent Labs  Lab 05/06/20 1518  NA 137  K 4.6  CL 107  CO2 20*  GLUCOSE 125*  BUN 31*  CREATININE 1.41*  CALCIUM 8.4*   GFR: Estimated Creatinine Clearance: 30.4 mL/min (A) (by C-G formula based on SCr of 1.41 mg/dL (H)). Liver Function Tests: Recent Labs  Lab 05/06/20 1518  AST 25  ALT 24  ALKPHOS 171*  BILITOT 0.4  PROT 5.7*  ALBUMIN 3.7   No results for input(s): LIPASE, AMYLASE in the last 168 hours. No results for input(s): AMMONIA in the last 168 hours. Coagulation Profile: No results for input(s): INR, PROTIME in the last 168 hours. Cardiac Enzymes: No results for input(s): CKTOTAL, CKMB, CKMBINDEX, TROPONINI in the last 168 hours. BNP (last 3 results) No results for input(s): PROBNP in the last 8760 hours. HbA1C: No results for input(s): HGBA1C in the last 72 hours. CBG: No results for input(s): GLUCAP in the last 168 hours. Lipid Profile: No results for input(s): CHOL, HDL, LDLCALC, TRIG, CHOLHDL, LDLDIRECT in the last 72 hours. Thyroid Function Tests: No results for input(s): TSH, T4TOTAL, FREET4, T3FREE, THYROIDAB in the last 72  hours. Anemia Panel: No results for input(s): VITAMINB12, FOLATE, FERRITIN, TIBC, IRON, RETICCTPCT in the last 72 hours. Urine analysis:    Component Value Date/Time   COLORURINE YELLOW 05/06/2020 1518   APPEARANCEUR CLOUDY (A) 05/06/2020 1518   LABSPEC 1.020 05/06/2020 1518   PHURINE 5.5 05/06/2020 1518   GLUCOSEU NEGATIVE 05/06/2020 1518   GLUCOSEU NEGATIVE 12/11/2019 1142   HGBUR NEGATIVE 05/06/2020 1518   [place] 05/06/2020 1518   BILIRUBINUR negative 02/10/2020 1426   KETONESUR NEGATIVE 05/06/2020 1518   PROTEINUR 30 (A) 05/06/2020 1518   UROBILINOGEN 0.2 02/10/2020 1426   UROBILINOGEN 0.2 12/11/2019 1142   NITRITE NEGATIVE 05/06/2020 1518   LEUKOCYTESUR LARGE (A) 05/06/2020 1518    Radiological Exams on Admission: DG Chest Portable 1 View  Result Date: 05/06/2020 CLINICAL DATA:  Fever, chills and cough. EXAM: PORTABLE CHEST 1 VIEW COMPARISON:  None. FINDINGS: The lungs are hyperinflated. There is no evidence of acute infiltrate, pleural effusion or pneumothorax. The heart size and mediastinal contours are within normal limits. Multiple chronic left-sided rib fractures are seen. A radiopaque fusion plate and screws are seen within the proximal right humerus. IMPRESSION: No active cardiopulmonary disease. Electronically Signed   By: [place]gina Norfolk M.D.   On: 05/06/2020 15:41    EKG: Pending at this time.  Assessment/Plan Principal Problem:   UTI (urinary tract infection) Active Problems:   Elevated cholesterol   CLL (chronic lymphocytic leukemia) (H[facility])   AKI (acute kidney injury) (HCC)   Hypotension   Possible UTI: UA with evidence of pyuria and mild bacteriuria.  Hypotension likely due to dehydration.  No fever, tachycardia, or lactic acidosis to suggest sepsis.  Leukocytosis is chronic in the setting of CLL. -Continue ceftriaxone.  Urine culture pending.  Blood culture x2 pending.   Hypotension: Likely due to dehydration.  No fever, tachycardia, or  lactic acidosis to suggest sepsis.  Blood pressure soft. -Continue IV fluid hydration.  Check second set of lactate.  AKI: Likely prerenal from dehydration and hypotension.  BUN 31, creatinine 1.4 (  baseline 0.9). -IV fluid hydration and continue to monitor renal function.  Avoid nephrotoxic agents-hold home hydrochlorothiazide.  Mild normal anion gap metabolic acidosis: Likely due to AKI.  Bicarb 20, anion gap 10. -IV fluid hydration, continue to monitor  CLL: Followed by Dr. Lorenso Courier from oncology and currently under observation.  Last office visit was on 04/26/2020. -Outpatient oncology follow-up.  Hypertension -Hold antihypertensives given hypotension  Hyperlipidemia Gout GERD Depression -Resume home meds after pharmacy med rec is done.  DVT prophylaxis: Lovenox Code Status: Patient wishes to be DNR Family Communication: No family available at this time. Disposition Plan: Status is: Observation  The patient remains OBS appropriate and will d/c before 2 midnights.  Dispo: The patient is from: Home              Anticipated d/c is to: Home              Patient currently is not medically stable to d/c.   Difficult to place patient No   Level of care: Level of care: Telemetry Medical   The medical decision making on this patient was of high complexity and the patient is at high risk for clinical deterioration, therefore this is a level 3 visit.  Shela Leff MD [facility]  If 7PM-7AM, please contact night-coverage [email or web address]  05/06/2020, 8:26 PM

## 2020-05-06 NOTE — Progress Notes (Signed)
Pt admitted at 20:00.  Telemetry started with continuous pulse ox.  MIVF running.  Pt c/o constipation and a skin tear on her upper back that is painful.  Spoke with daughter and patient, and both stated the patient does not take medication for constipation.  Pt has only attempted increasing fiber.  Pt mentioned wanting to speak to the doctor about palliative care this admission.  Pt has DOE and nonpitting edema. PTA med assessment completed with patient and daughter [name].  Admission navigator complete.

## 2020-05-06 NOTE — ED Provider Notes (Signed)
[place]   [id number] 05/06/20 Arrival Time: 3009   CC: COVID symptoms  SUBJECTIVE: History from: patient.  Sarah [name] is a 80 y.o. female who presents with fever, chills, rhinorrhea, cough, fatigue, and body aches for the last 3 days. Denies sick exposure to COVID, flu or strep. Denies recent travel. Has negative history of Covid. Has completed Covid vaccines. Has completed Covid booster. Has completed flu vaccine this season. Has not taken OTC medications for this. There are no aggravating or alleviating factors. Has significant medical history of CLL, not currently undergoing chemo but is followed by heme/onc. Family reports that she has not been eating or drinking well. Denies previous symptoms in the past. Denies sinus pain, rhinorrhea, sore throat, SOB, wheezing, chest pain, nausea, changes in bowel or bladder habits.    ROS: As per HPI.  All other pertinent ROS negative.     Past Medical History:  Diagnosis Date  . Allergy   . CLL (chronic lymphocytic leukemia) ([place])   . GERD (gastroesophageal reflux disease)   . Gout   . HLD (hyperlipidemia)   . Hypertension    Past Surgical History:  Procedure Laterality Date  . ABDOMINAL HYSTERECTOMY    . APPENDECTOMY    . CHOLECYSTECTOMY    . KIDNEY SURGERY    . MOHS SURGERY     Allergies  Allergen Reactions  . Amoxicillin   . Augmentin [Amoxicillin-Pot Clavulanate]     GI upset   . Drug [Tape]   . Sulfa Antibiotics     Childhood   . Atenolol Rash  . Macrodantin [Nitrofurantoin] Rash   No current facility-administered medications on file prior to encounter.   Current Outpatient Medications on File Prior to Encounter  Medication Sig Dispense Refill  . trimethoprim (TRIMPEX) 100 MG tablet Take 100 mg by mouth at bedtime.    Marland Kitchen acetaminophen (TYLENOL) 500 MG tablet Take 500 mg by mouth every 6 (six) hours as needed.    . busPIRone (BUSPAR) 15 MG tablet Take 1 tablet (15 mg total) by mouth 2 (two) times daily.  180 tablet 1  . Cranberry Extract 250 MG TABS Take 500 mg by mouth.    . ezetimibe (ZETIA) 10 MG tablet Take 10 mg by mouth daily.    . fluticasone (FLONASE) 50 MCG/ACT nasal spray Place 2 sprays into both nostrils daily. 16 g 6  . glucosamine-chondroitin 500-400 MG tablet Take 1 tablet by mouth daily.    Marland Kitchen guaifenesin (HUMIBID E) 400 MG TABS tablet Take 400 mg by mouth every 4 (four) hours.    Marland Kitchen ibuprofen (ADVIL) 200 MG tablet Take 400 mg by mouth 3 (three) times daily.    . lansoprazole (PREVACID) 30 MG capsule Take 30 mg by mouth daily at 12 noon.    . loperamide (IMODIUM) 2 MG capsule Take by mouth as needed for diarrhea or loose stools. (Patient not taking: Reported on 02/10/2020)    . loratadine (CLARITIN) 10 MG tablet Take 10 mg by mouth daily as needed for allergies.    . Melatonin 5 MG CHEW Chew by mouth.  (Patient not taking: Reported on 02/10/2020)    . metoprolol-hydrochlorothiazide (LOPRESSOR HCT) 100-25 MG tablet Take 1 tablet by mouth daily. Takes 1/2 tablet daily    . Multiple Vitamins-Minerals (EQ MULTIVITAMINS ADULT GUMMY) CHEW Chew by mouth.    . phenazopyridine (PYRIDIUM) 95 MG tablet Take 95 mg by mouth 3 (three) times daily as needed for pain.  (Patient not taking: No sig reported)    .  potassium chloride (KLOR-CON) 10 MEQ tablet Take 10 mEq by mouth daily.    . Sulfamethoxazole-Trimethoprim (BACTRIM DS PO) Take 100 mg by mouth at bedtime.    Marland Kitchen venlafaxine XR (EFFEXOR-XR) 150 MG 24 hr capsule Take 150 mg by mouth daily with breakfast.    . [DISCONTINUED] metoprolol tartrate (LOPRESSOR) 25 MG tablet Take 25 mg by mouth 2 (two) times daily.     Social History   Socioeconomic History  . Marital status: Widowed    Spouse name: Not on file  . Number of children: Not on file  . Years of education: Not on file  . Highest education level: Not on file  Occupational History  . Not on file  Tobacco Use  . Smoking status: Never Smoker  . Smokeless tobacco: Never Used  Vaping  Use  . Vaping Use: Never used  Substance and Sexual Activity  . Alcohol use: Not Currently    Comment: Rare social drinking  . Drug use: Never  . Sexual activity: Not Currently  Other Topics Concern  . Not on file  Social History Narrative  . Not on file   Social Determinants of Health   Financial Resource Strain: Low Risk   . Difficulty of Paying Living Expenses: Not hard at all  Food Insecurity: No Food Insecurity  . Worried About Charity fundraiser in the Last Year: Never true  . Ran Out of Food in the Last Year: Never true  Transportation Needs: No Transportation Needs  . Lack of Transportation (Medical): No  . Lack of Transportation (Non-Medical): No  Physical Activity: Inactive  . Days of Exercise per Week: 0 days  . Minutes of Exercise per Session: 0 min  Stress: No Stress Concern Present  . Feeling of Stress : Not at all  Social Connections: Moderately Isolated  . Frequency of Communication with Friends and Family: More than three times a week  . Frequency of Social Gatherings with Friends and Family: Once a week  . Attends Religious Services: 1 to 4 times per year  . Active Member of Clubs or Organizations: No  . Attends Archivist Meetings: Never  . Marital Status: Widowed  Intimate Partner Violence: Not At Risk  . Fear of Current or Ex-Partner: No  . Emotionally Abused: No  . Physically Abused: No  . Sexually Abused: No   Family History  Problem Relation Age of Onset  . Colon cancer Mother   . Heart attack Father     OBJECTIVE:  Vitals:   05/06/20 1408  BP: (!) 89/59  Pulse: 68  Resp: 18  Temp: 97.6 F (36.4 C)  SpO2: 97%     General appearance: alert; appears fatigued, but nontoxic; speaking in full sentences and tolerating own secretions, looks pale and weak in office HEENT: NCAT; Ears: EACs clear, TMs pearly gray; Eyes: PERRL. EOM grossly intact. Sinuses: nontender; Nose: nares patent with clear rhinorrhea, Throat: oropharynx  erythematous, cobblestoning present, tonsils non erythematous or enlarged, uvula midline  Neck: supple without LAD Lungs: unlabored respirations, symmetrical air entry; cough: absent; no respiratory distress; CTAB Heart: regular rate and rhythm.  Radial pulses 2+ symmetrical bilaterally Skin: warm and dry Psychological: alert and cooperative; normal mood and affect  LABS:  No results found for this or any previous visit (from the past 24 hour(s)).   ASSESSMENT & PLAN:  1. Hypotension, unspecified hypotension type   2. Viral URI with cough   3. Dehydration   4. CLL (chronic lymphocytic leukemia) (  [place])   5. Weakness   6. Other fatigue     COVID and flu testing ordered.  It will take between 2-3 days for test results. Someone will contact you regarding abnormal results.   Patient should remain in quarantine until they have received Covid results.  If negative you may resume normal activities (go back to work/school) while practicing hand hygiene, social distance, and mask wearing.  If positive, patient should remain in quarantine for at least 5 days from symptom onset AND greater than 72 hours after symptoms resolution (absence of fever without the use of fever-reducing medication and improvement in respiratory symptoms), whichever is longer  Discussed that given clinical presentation, hypotension, CLL and fever that patient needs a higher level of care Concerns for acute infection, acute kidney injury due to dehydration, electrolyte imbalance, anemia given pale skin and fatigue Patient and family verbalized understanding and is in agreement with treatment plan To ER via POV Stable at discharge     Faustino Congress, NP 05/06/20 1431

## 2020-05-06 NOTE — ED Provider Notes (Signed)
Emergency Department Provider Note   I have reviewed the triage vital signs and the nursing notes.   HISTORY  Chief Complaint URI   HPI Sarah [name] is a 80 y.o. female with past medical history of CLL, hyperlipidemia, hypertension, not currently on chemotherapy presents to the emergency department with congestion, shortness of breath, fever over the past 4 days.  The patient's been feeling fatigued and presented to urgent care today for evaluation thinking she may have the flu.  Upon their evaluation the patient was found to be hypotensive and directed POV to the emergency department for further evaluation.  Patient states that she is eating fairly well and drinking fluids.  She denies seeing any gross blood or black in her bowel movements.  She states of anything she has been constipated.  She has not had change to the dosing of her blood pressure or other medications.  She is not experiencing chest pain, pleuritic or otherwise.  Patient denies any confusion and family confirms this at bedside. No radiation of symptoms or modifying factors. Patient does take prophylactic  100 mg Bactrim with history of frequent UTI.    Past Medical History:  Diagnosis Date  . Allergy   . CLL (chronic lymphocytic leukemia) ([place])   . GERD (gastroesophageal reflux disease)   . Gout   . HLD (hyperlipidemia)   . Hypertension     Patient Active Problem List   Diagnosis Date Noted  . 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 Acute cystitis with hematuria 12/15/2019  . Allergic rhinitis due to pollen 12/08/2019  . Elevated cholesterol 12/08/2019  . History of MI (myocardial infarction) 12/08/2019  . Urinary frequency 12/08/2019  . CLL (chronic lymphocytic leukemia) ([place]) 12/08/2019  . History of gout 12/08/2019    Past Surgical History:  Procedure Laterality Date  . ABDOMINAL HYSTERECTOMY    . APPENDECTOMY    . CHOLECYSTECTOMY    . KIDNEY SURGERY    . MOHS SURGERY      Allergies Amoxicillin,  Augmentin [amoxicillin-pot clavulanate], Drug [tape], Sulfa antibiotics, Atenolol, and Macrodantin [nitrofurantoin]  Family History  Problem Relation Age of Onset  . Colon cancer Mother   . Heart attack Father     Social History Social History   Tobacco Use  . Smoking status: Never Smoker  . Smokeless tobacco: Never Used  Vaping Use  . Vaping Use: Never used  Substance Use Topics  . Alcohol use: Not Currently    Comment: Rare social drinking  . Drug use: Never    Review of Systems  Constitutional: No fever/chills. Positive fatigue.  Eyes: No visual changes. ENT: No sore throat. Positive nasal congestion.  Cardiovascular: Denies CP.  Respiratory: Positive shortness of breath. Gastrointestinal: No abdominal pain.  No nausea, no vomiting.  No diarrhea.  No constipation. Genitourinary: Negative for dysuria. Musculoskeletal: Negative for back pain. Skin: Negative for rash. Neurological: Negative for headaches, focal weakness or numbness.  10-point ROS otherwise negative.  ____________________________________________   PHYSICAL EXAM:  VITAL SIGNS: ED Triage Vitals  Enc Vitals Group     BP 05/06/20 1504 105/66     Pulse Rate 05/06/20 1504 63     Resp 05/06/20 1504 18     Temp 05/06/20 1504 98.5 F (36.9 C)     Temp Source 05/06/20 1504 Oral     SpO2 05/06/20 1504 99 %     Weight 05/06/20 1505 161 lb (73 kg)     Height 05/06/20 1505 5' 3"  (1.6 m)   Constitutional:  Alert and oriented. Well appearing and in no acute distress. Eyes: Conjunctivae are normal.  Head: Atraumatic. Nose: No congestion/rhinnorhea. Mouth/Throat: Mucous membranes are moist.  Neck: No stridor.   Cardiovascular: Normal rate, regular rhythm. Good peripheral circulation. Grossly normal heart sounds.   Respiratory: Normal respiratory effort.  No retractions. Lungs CTAB. Gastrointestinal: Soft and nontender. No distention.  Musculoskeletal: No lower extremity tenderness nor edema. No gross  deformities of extremities. Neurologic:  Normal speech and language. No gross focal neurologic deficits are appreciated.  Skin:  Skin is warm, dry and intact. No rash noted.   ____________________________________________   LABS (all labs ordered are listed, but only abnormal results are displayed)  Labs Reviewed  COMPREHENSIVE METABOLIC PANEL - Abnormal; Notable for the following components:      Result Value   CO2 20 (*)    Glucose, Bld 125 (*)    BUN 31 (*)    Creatinine, Ser 1.41 (*)    Calcium 8.4 (*)    Total Protein 5.7 (*)    Alkaline Phosphatase 171 (*)    GFR, Estimated 38 (*)    All other components within normal limits  CBC WITH DIFFERENTIAL/PLATELET - Abnormal; Notable for the following components:   WBC 41.0 (*)    RBC 3.11 (*)    Hemoglobin 10.0 (*)    HCT 32.6 (*)    MCV 104.8 (*)    Lymphs Abs 36.4 (*)    All other components within normal limits  URINALYSIS, ROUTINE W REFLEX MICROSCOPIC - Abnormal; Notable for the following components:   APPearance CLOUDY (*)    Protein, ur 30 (*)    Leukocytes,Ua LARGE (*)    WBC, UA >50 (*)    Bacteria, UA FEW (*)    All other components within normal limits  RESP PANEL BY RT-PCR (FLU A&B, COVID) [id number]  URINE CULTURE  CULTURE, BLOOD (ROUTINE X 2)  CULTURE, BLOOD (ROUTINE X 2)  LACTIC ACID, PLASMA   ____________________________________________  EKG  Rate: 67 PR: 171 QTc: 472  Sinus rhythm. RBBB/LAFB. LVH. No prior tracing for comparison.  ____________________________________________  [id number]  DG Chest Portable 1 View  Result Date: 05/06/2020 CLINICAL DATA:  Fever, chills and cough. EXAM: PORTABLE CHEST 1 VIEW COMPARISON:  None. FINDINGS: The lungs are hyperinflated. There is no evidence of acute infiltrate, pleural effusion or pneumothorax. The heart size and mediastinal contours are within normal limits. Multiple chronic left-sided rib fractures are seen. A radiopaque fusion plate and screws are seen  within the proximal right humerus. IMPRESSION: No active cardiopulmonary disease. Electronically Signed   By: Virgina Norfolk M.D.   On: 05/06/2020 15:41    ____________________________________________   PROCEDURES  Procedure(s) performed:   Procedures  CRITICAL CARE Performed by: Margette Fast Total critical care time: 35 minutes Critical care time was exclusive of separately billable procedures and treating other patients. Critical care was necessary to treat or prevent imminent or life-threatening deterioration. Critical care was time spent personally by me on the following activities: development of treatment plan with patient and/or surrogate as well as nursing, discussions with consultants, evaluation of patient's response to treatment, examination of patient, obtaining history from patient or surrogate, ordering and performing treatments and interventions, ordering and review of laboratory studies, ordering and review of radiographic studies, pulse oximetry and re-evaluation of patient's condition.  Nanda Quinton, MD Emergency Medicine  ____________________________________________   INITIAL IMPRESSION / ASSESSMENT AND PLAN / ED COURSE  Pertinent labs & imaging results that were available  during my care of the patient were reviewed by me and considered in my medical decision making (see chart for details).   Patient presents emergency department evaluation of congestion with cough and shortness of breath.  She has some fatigue.  Her blood pressures are soft here.  Blood pressure from urgent care with systolic 89 but here in the 100s to high 90s.  She is awake and alert.  She appears well perfused.  Abdomen is soft and nontender.  No chest pain.  Normal rate on telemetry which appears in sinus rhythm.  Patient is not on chemotherapy but has had fever intermittently at home over the past 4 days.  Plan for labs including lactate, EKG, chest x-ray, Covid/flu PCR. Will start with IVF  bolus and reassess.   Lab work shows leukocytosis similar to labs drawn prior with white count of 41 consistent with the patient's CLL.  COVID and flu are negative.  Lactate is normal.  Patient does have some mild renal insufficiency which does appear somewhat worse from her baseline.  CO2 of 20 along with BUN elevated as well.  Mild elevation in alk phos but no belly pain.  Bilirubin and LFTs are normal.  UA does show some sign of infection despite being on prophylactic Bactrim. BP remains soft but improving slightly with IV fluids.  Given subjective fever at home, soft blood pressures, possible UTI with history of E. coli in prior UA recommended blood cultures and Rocephin which were ordered.  Also recommended observational admission.   Discussed patient's case with [facility] to request admission. Patient and family (if present) updated with plan. Care transferred to [facility] service.  I reviewed all nursing notes, vitals, pertinent old records, EKGs, labs, imaging (as available).  ____________________________________________  FINAL CLINICAL IMPRESSION(S) / ED DIAGNOSES  Final diagnoses:  Acute cystitis without hematuria  Hypotension, unspecified hypotension type     MEDICATIONS GIVEN DURING THIS VISIT:  Medications  lactated ringers infusion (has no administration in time range)  sodium chloride 0.9 % bolus 500 mL (0 mLs Intravenous Stopped 05/06/20 1704)  cefTRIAXone (ROCEPHIN) 1 g in sodium chloride 0.9 % 100 mL IVPB (1 g Intravenous New Bag/Given 05/06/20 1651)    Note:  This document was prepared using Dragon voice recognition software and may include unintentional dictation errors.  Nanda Quinton, MD, [facility] Emergency Medicine    Ethelyn Cerniglia, [facility], MD 05/06/20 1740

## 2020-05-07 ENCOUNTER — Inpatient Hospital Stay (HOSPITAL_COMMUNITY): Payer: Medicare Other

## 2020-05-07 DIAGNOSIS — N136 Pyonephrosis: Secondary | ICD-10-CM | POA: Diagnosis present

## 2020-05-07 DIAGNOSIS — A419 Sepsis, unspecified organism: Secondary | ICD-10-CM | POA: Diagnosis present

## 2020-05-07 DIAGNOSIS — E7800 Pure hypercholesterolemia, unspecified: Secondary | ICD-10-CM

## 2020-05-07 DIAGNOSIS — Z792 Long term (current) use of antibiotics: Secondary | ICD-10-CM | POA: Diagnosis not present

## 2020-05-07 DIAGNOSIS — Z888 Allergy status to other drugs, medicaments and biological substances status: Secondary | ICD-10-CM | POA: Diagnosis not present

## 2020-05-07 DIAGNOSIS — Z882 Allergy status to sulfonamides status: Secondary | ICD-10-CM | POA: Diagnosis not present

## 2020-05-07 DIAGNOSIS — E785 Hyperlipidemia, unspecified: Secondary | ICD-10-CM | POA: Diagnosis present

## 2020-05-07 DIAGNOSIS — Z87440 Personal history of urinary (tract) infections: Secondary | ICD-10-CM | POA: Diagnosis not present

## 2020-05-07 DIAGNOSIS — C9110 Chronic lymphocytic leukemia of B-cell type not having achieved remission: Secondary | ICD-10-CM | POA: Diagnosis present

## 2020-05-07 DIAGNOSIS — Z881 Allergy status to other antibiotic agents status: Secondary | ICD-10-CM | POA: Diagnosis not present

## 2020-05-07 DIAGNOSIS — Z800 Family history of malignant neoplasm of digestive organs: Secondary | ICD-10-CM | POA: Diagnosis not present

## 2020-05-07 DIAGNOSIS — E872 Acidosis: Secondary | ICD-10-CM | POA: Diagnosis present

## 2020-05-07 DIAGNOSIS — Z853 Personal history of malignant neoplasm of breast: Secondary | ICD-10-CM | POA: Diagnosis not present

## 2020-05-07 DIAGNOSIS — Z8249 Family history of ischemic heart disease and other diseases of the circulatory system: Secondary | ICD-10-CM | POA: Diagnosis not present

## 2020-05-07 DIAGNOSIS — Z20822 Contact with and (suspected) exposure to covid-19: Secondary | ICD-10-CM | POA: Diagnosis present

## 2020-05-07 DIAGNOSIS — M109 Gout, unspecified: Secondary | ICD-10-CM | POA: Diagnosis present

## 2020-05-07 DIAGNOSIS — N3000 Acute cystitis without hematuria: Secondary | ICD-10-CM | POA: Diagnosis present

## 2020-05-07 DIAGNOSIS — I10 Essential (primary) hypertension: Secondary | ICD-10-CM | POA: Diagnosis present

## 2020-05-07 DIAGNOSIS — E860 Dehydration: Secondary | ICD-10-CM | POA: Diagnosis present

## 2020-05-07 DIAGNOSIS — I959 Hypotension, unspecified: Secondary | ICD-10-CM | POA: Diagnosis present

## 2020-05-07 DIAGNOSIS — N179 Acute kidney failure, unspecified: Secondary | ICD-10-CM | POA: Diagnosis present

## 2020-05-07 DIAGNOSIS — I252 Old myocardial infarction: Secondary | ICD-10-CM | POA: Diagnosis not present

## 2020-05-07 DIAGNOSIS — Z880 Allergy status to penicillin: Secondary | ICD-10-CM | POA: Diagnosis not present

## 2020-05-07 DIAGNOSIS — N390 Urinary tract infection, site not specified: Secondary | ICD-10-CM | POA: Diagnosis not present

## 2020-05-07 DIAGNOSIS — K219 Gastro-esophageal reflux disease without esophagitis: Secondary | ICD-10-CM | POA: Diagnosis present

## 2020-05-07 DIAGNOSIS — Z66 Do not resuscitate: Secondary | ICD-10-CM | POA: Diagnosis present

## 2020-05-07 LAB — MAGNESIUM: Magnesium: 1.9 mg/dL (ref 1.7–2.4)

## 2020-05-07 LAB — BRAIN NATRIURETIC PEPTIDE: B Natriuretic Peptide: 193.1 pg/mL — ABNORMAL HIGH (ref 0.0–100.0)

## 2020-05-07 LAB — BASIC METABOLIC PANEL
Anion gap: 7 (ref 5–15)
BUN: 28 mg/dL — ABNORMAL HIGH (ref 8–23)
CO2: 21 mmol/L — ABNORMAL LOW (ref 22–32)
Calcium: 8.3 mg/dL — ABNORMAL LOW (ref 8.9–10.3)
Chloride: 107 mmol/L (ref 98–111)
Creatinine, Ser: 1.29 mg/dL — ABNORMAL HIGH (ref 0.44–1.00)
GFR, Estimated: 42 mL/min — ABNORMAL LOW (ref >60)
Glucose, Bld: 129 mg/dL — ABNORMAL HIGH (ref 70–99)
Potassium: 4.3 mmol/L (ref 3.5–5.1)
Sodium: 135 mmol/L (ref 135–145)

## 2020-05-07 LAB — CBC
HCT: 26.9 % — ABNORMAL LOW (ref 36.0–46.0)
Hemoglobin: 8.6 g/dL — ABNORMAL LOW (ref 12.0–15.0)
MCH: 32.8 pg (ref 26.0–34.0)
MCHC: 32 g/dL (ref 30.0–36.0)
MCV: 102.7 fL — ABNORMAL HIGH (ref 80.0–100.0)
Platelets: 192 10*3/uL (ref 150–400)
RBC: 2.62 MIL/uL — ABNORMAL LOW (ref 3.87–5.11)
RDW: 15.2 % (ref 11.5–15.5)
WBC: 29.6 10*3/uL — ABNORMAL HIGH (ref 4.0–10.5)
nRBC: 0 % (ref 0.0–0.2)

## 2020-05-07 LAB — COVID-19, FLU A+B NAA
Influenza A, NAA: NOT DETECTED
Influenza B, NAA: NOT DETECTED
SARS-CoV-2, NAA: NOT DETECTED

## 2020-05-07 LAB — PROCALCITONIN: Procalcitonin: 0.29 ng/mL

## 2020-05-07 MED ORDER — VENLAFAXINE HCL ER 75 MG PO CP24
150.0000 mg | ORAL_CAPSULE | Freq: Every day | ORAL | Status: DC
  Administered 2020-05-08 – 2020-05-14 (×7): 150 mg via ORAL
  Filled 2020-05-07 (×7): qty 2

## 2020-05-07 MED ORDER — SODIUM CHLORIDE 0.9 % IV SOLN
2.0000 g | INTRAVENOUS | Status: DC
  Administered 2020-05-07 – 2020-05-14 (×8): 2 g via INTRAVENOUS
  Filled 2020-05-07 (×9): qty 20

## 2020-05-07 MED ORDER — POTASSIUM CHLORIDE CRYS ER 10 MEQ PO TBCR
10.0000 meq | EXTENDED_RELEASE_TABLET | Freq: Every day | ORAL | Status: DC
  Administered 2020-05-08 – 2020-05-10 (×3): 10 meq via ORAL
  Filled 2020-05-07 (×3): qty 1

## 2020-05-07 MED ORDER — TRAMADOL HCL 50 MG PO TABS
50.0000 mg | ORAL_TABLET | Freq: Once | ORAL | Status: DC
Start: 2020-05-07 — End: 2020-05-07

## 2020-05-07 MED ORDER — LIDOCAINE 5 % EX PTCH
1.0000 | MEDICATED_PATCH | CUTANEOUS | Status: DC
  Administered 2020-05-07 – 2020-05-12 (×4): 1 via TRANSDERMAL
  Filled 2020-05-07 (×4): qty 1

## 2020-05-07 MED ORDER — LOPERAMIDE HCL 2 MG PO CAPS: 2.0000 mg | ORAL_CAPSULE | Freq: Four times a day (QID) | ORAL | Status: DC | PRN

## 2020-05-07 MED ORDER — EZETIMIBE 10 MG PO TABS
10.0000 mg | ORAL_TABLET | Freq: Every day | ORAL | Status: DC
  Administered 2020-05-07 – 2020-05-14 (×8): 10 mg via ORAL
  Filled 2020-05-07 (×8): qty 1

## 2020-05-07 MED ORDER — PANTOPRAZOLE SODIUM 40 MG PO TBEC
40.0000 mg | DELAYED_RELEASE_TABLET | Freq: Every day | ORAL | Status: DC
  Administered 2020-05-07 – 2020-05-14 (×8): 40 mg via ORAL
  Filled 2020-05-07 (×8): qty 1

## 2020-05-07 MED ORDER — BUSPIRONE HCL 5 MG PO TABS
15.0000 mg | ORAL_TABLET | Freq: Two times a day (BID) | ORAL | Status: DC
  Administered 2020-05-07 – 2020-05-14 (×15): 15 mg via ORAL
  Filled 2020-05-07 (×15): qty 3

## 2020-05-07 NOTE — Progress Notes (Signed)
PROGRESS NOTE                                                                                                                                                                                                             Patient Demographics:    Sarah [name], is a 80 y.o. female, DOB - 02/28/1940, WIO:[id number]  Outpatient Primary MD for the patient is Libby Maw, MD    LOS - 0  Admit date - 05/06/2020    Chief Complaint  Patient presents with  . URI       Brief Narrative (HPI from H&P)   Sarah [name] is a 80 y.o. 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   Subjective:    [name] today has, No headache, No chest pain, No abdominal pain - No Nausea, No new weakness tingling or numbness, no SOB.   Assessment  & Plan :    1. Sepsis with UTI - Recurrent UTIs  - 4-5 episode in 2 mths, on Rocephin, follow cultures and fever curve, check Renal US - rule out obstruction, IVF and monitor, Sepsis pathophysiology has resolved.   2. AKI and Hypotension - due to sepsis, better with IVF and ABX.  3. CLL - post DC follow with Dr Lorenso Courier.  4. Dyslipidemia - on Zetia.  5.GERD - PPI.   6. Depression - home Rx.  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       Condition - Extremely Guarded  Family Communication  :  Daughter [phone]  On 05/07/20  Code Status :  DNR  Consults  :  none  PUD Prophylaxis : None   Procedures  :     Renal US      Disposition Plan  :    Status is: Observation  Dispo: The patient is from: Home              Anticipated d/c is to: Home              Patient currently is not medically stable to d/c.   Difficult to  place patient No   DVT Prophylaxis  :  Lovenox    Lab Results  Component Value Date   PLT 192 05/07/2020    Diet :  Diet Order            Diet Heart Room service appropriate? Yes; Fluid consistency: Thin  Diet effective now                  Inpatient Medications  Scheduled Meds: . busPIRone  15 mg Oral BID  . enoxaparin (LOVENOX) injection  30 mg Subcutaneous Q24H  . ezetimibe  10 mg Oral Daily  . feeding supplement  237 mL Oral BID BM  . lidocaine  1 patch Transdermal Q24H  . pantoprazole  40 mg Oral Daily  . [START ON 05/08/2020] potassium chloride  10 mEq Oral Daily  . [START ON 05/08/2020] venlafaxine XR  150 mg Oral Q breakfast   Continuous Infusions: . cefTRIAXone (ROCEPHIN)  IV 2 g (05/07/20 0908)  . lactated ringers 125 mL/hr at 05/07/20 0622   PRN Meds:.acetaminophen **OR** acetaminophen, loperamide  Antibiotics  :    Anti-infectives (From admission, onward)   Start     Dose/Rate Route Frequency Ordered Stop   05/07/20 1600  cefTRIAXone (ROCEPHIN) 1 g in sodium chloride 0.9 % 100 mL IVPB  Status:  Discontinued        1 g 200 mL/hr over 30 Minutes Intravenous Every 24 hours 05/06/20 2030 05/07/20 0736   05/07/20 0800  cefTRIAXone (ROCEPHIN) 2 g in sodium chloride 0.9 % 100 mL IVPB        2 g 200 mL/hr over 30 Minutes Intravenous Every 24 hours 05/07/20 0736     05/06/20 1645  cefTRIAXone (ROCEPHIN) 1 g in sodium chloride 0.9 % 100 mL IVPB        1 g 200 mL/hr over 30 Minutes Intravenous  Once 05/06/20 1636 05/06/20 1753       Time Spent in minutes  30   Lala Lund M.D on 05/07/2020 at 10:42 AM  To page go to [email or web address]   [facility] -  Office  [phone]    See all Orders from today for further details    Objective:   Vitals:   05/07/20 0340 05/07/20 0726 05/07/20 0802 05/07/20 0921  BP: 113/68 120/67 115/69 112/71  Pulse: 83 99 97 90  Resp: 19 (!) 23 (!) 28 (!) 27  Temp: 98.5 F (36.9 C) (!) 102.1 F (38.9 C) 100.1 F  (37.8 C) 98.8 F (37.1 C)  TempSrc: Oral Oral Oral Oral  SpO2: 100% 99% 98% 100%  Weight:      Height:        Wt Readings from Last 3 Encounters:  05/06/20 73 kg  04/26/20 73.3 kg  02/10/20 73.8 kg     Intake/Output Summary (Last 24 hours) at 05/07/2020 1042 Last data filed at 05/07/2020 0724 Gross per 24 hour  Intake 603.82 ml  Output 800 ml  Net -196.18 ml     Physical Exam  Awake Alert, No new F.N deficits, Normal affect [place].AT,PERRAL Supple Neck,No JVD, No cervical lymphadenopathy appriciated.  Symmetrical Chest wall movement, Good air movement bilaterally, CTAB RRR,No Gallops,Rubs or new Murmurs, No Parasternal Heave +ve B.Sounds, Abd Soft, No tenderness, No organomegaly appriciated, No rebound - guarding or rigidity. No Cyanosis, Clubbing or edema, No new Rash or bruise       Data Review:    CBC Recent Labs  Lab 05/06/20 1518 05/07/20 0139  WBC 41.0* 29.6*  HGB 10.0* 8.6*  HCT 32.6* 26.9*  PLT 214 192  MCV 104.8* 102.7*  MCH 32.2 32.8  MCHC 30.7 32.0  RDW 15.5 15.2  LYMPHSABS 36.4*  --   MONOABS 0.5  --   EOSABS 0.3  --   BASOSABS 0.0  --     Recent Labs  Lab 05/06/20 1518 05/06/20 2241 05/07/20 0139  NA 137  --  135  K 4.6  --  4.3  CL 107  --  107  CO2 20*  --  21*  GLUCOSE 125*  --  129*  BUN 31*  --  28*  CREATININE 1.41*  --  1.29*  CALCIUM 8.4*  --  8.3*  AST 25  --   --   ALT 24  --   --   ALKPHOS 171*  --   --   BILITOT 0.4  --   --   ALBUMIN 3.7  --   --   LATICACIDVEN 1.1 1.1  --     ------------------------------------------------------------------------------------------------------------------ No results for input(s): CHOL, HDL, LDLCALC, TRIG, CHOLHDL, LDLDIRECT in the last 72 hours.  No results found for: HGBA1C ------------------------------------------------------------------------------------------------------------------ No results for input(s): TSH, T4TOTAL, T3FREE, THYROIDAB in the last 72 hours.  Invalid  input(s): FREET3  Cardiac Enzymes No results for input(s): CKMB, TROPONINI, MYOGLOBIN in the last 168 hours.  Invalid input(s): CK ------------------------------------------------------------------------------------------------------------------ No results found for: BNP  Micro Results Recent Results (from the past 240 hour(s))  Resp Panel by RT-PCR (Flu A&B, Covid) Nasopharyngeal Swab     Status: None   Collection Time: 05/06/20  3:18 PM   Specimen: Nasopharyngeal Swab; Nasopharyngeal(NP) swabs in vial transport medium  Result Value Ref Range Status   SARS Coronavirus 2 by RT PCR NEGATIVE NEGATIVE Final    Comment: (NOTE) SARS-CoV-2 target nucleic acids are NOT DETECTED.  The SARS-CoV-2 RNA is generally detectable in upper respiratory specimens during the acute phase of infection. The lowest concentration of SARS-CoV-2 viral copies this assay can detect is 138 copies/mL. A negative result does not preclude SARS-Cov-2 infection and should not be used as the sole basis for treatment or other patient management decisions. A negative result may occur with  improper specimen collection/handling, submission of specimen other than nasopharyngeal swab, presence of viral mutation(s) within the areas targeted by this assay, and inadequate number of viral copies(<138 copies/mL). A negative result must be combined with clinical observations, patient history, and epidemiological information. The expected result is Negative.  Fact Sheet for Patients:  EntrepreneurPulse.com.au  Fact Sheet for Healthcare Providers:  IncredibleEmployment.be  This test is no t yet approved or cleared by the [place] FDA and  has been authorized for detection and/or diagnosis of SARS-CoV-2 by FDA under an Emergency Use Authorization (EUA). This EUA will remain  in effect (meaning this test can be used) for the duration of the COVID-19 declaration under Section 564(b)(1)  of the Act, 21 U.S.C.section 360bbb-3(b)(1), unless the authorization is terminated  or revoked sooner.       Influenza A by PCR NEGATIVE NEGATIVE Final   Influenza B by PCR NEGATIVE NEGATIVE Final    Comment: (NOTE) The Xpert Xpress SARS-CoV-2/FLU/RSV plus assay is intended as an aid in the diagnosis of influenza from Nasopharyngeal swab specimens and should not be used as a sole basis for treatment. Nasal washings and aspirates are unacceptable for Xpert Xpress SARS-CoV-2/FLU/RSV testing.  Fact Sheet for Patients: EntrepreneurPulse.com.au  Fact Sheet for Healthcare Providers: IncredibleEmployment.be  This test is not yet approved or cleared by the [place] FDA  and has been authorized for detection and/or diagnosis of SARS-CoV-2 by FDA under an Emergency Use Authorization (EUA). This EUA will remain in effect (meaning this test can be used) for the duration of the COVID-19 declaration under Section 564(b)(1) of the Act, 21 U.S.C. section 360bbb-3(b)(1), unless the authorization is terminated or revoked.  Performed at [facility]     Radiology Reports DG Chest Portable 1 View  Result Date: 05/06/2020 CLINICAL DATA:  Fever, chills and cough. EXAM: PORTABLE CHEST 1 VIEW COMPARISON:  None. FINDINGS: The lungs are hyperinflated. There is no evidence of acute infiltrate, pleural effusion or pneumothorax. The heart size and mediastinal contours are within normal limits. Multiple chronic left-sided rib fractures are seen. A radiopaque fusion plate and screws are seen within the proximal right humerus. IMPRESSION: No active cardiopulmonary disease. Electronically Signed   By: Virgina Norfolk M.D.   On: 05/06/2020 15:41

## 2020-05-07 NOTE — Progress Notes (Signed)
   05/07/20 0726  Vitals  Temp (!) 102.1 F (38.9 C)  Temp Source Oral  BP 120/67  MAP (mmHg) 83  BP Location Right Arm  BP Method Automatic  Patient Position (if appropriate) Lying  Pulse Rate 99  Pulse Rate Source Monitor  ECG Heart Rate 100  Resp (!) 23  MEWS COLOR  MEWS Score Color Yellow  Oxygen Therapy  SpO2 99 %  MEWS Score  MEWS Temp 2  MEWS Systolic 0  MEWS Pulse 0  MEWS RR 1  MEWS LOC 0  MEWS Score 3

## 2020-05-07 NOTE — Progress Notes (Signed)
   05/07/20 2000  Assess: MEWS Score  Temp 99 F (37.2 C)  BP (!) 105/59  Pulse Rate 90  ECG Heart Rate 90  Resp (!) 26  Level of Consciousness Alert  SpO2 100 %  O2 Device Room Air  Assess: MEWS Score  MEWS Temp 0  MEWS Systolic 0  MEWS Pulse 0  MEWS RR 2  MEWS LOC 0  MEWS Score 2  MEWS Score Color Yellow  Assess: if the MEWS score is Yellow or Red  Were vital signs taken at a resting state? Yes  Focused Assessment No change from prior assessment  Early Detection of Sepsis Score *See Row Information* Medium  MEWS guidelines implemented *See Row Information* No, previously yellow, continue vital signs every 4 hours  Treat  Pain Scale 0-10  Pain Score 0  Notify: Charge Nurse/RN  Name of Charge Nurse/RN Notified [place]  Date Charge Nurse/RN Notified 05/07/20  Time Charge Nurse/RN Notified 2043  Document  Progress note created (see row info) Yes

## 2020-05-07 NOTE — Progress Notes (Signed)
Nutrition Brief Note  Patient identified on the Malnutrition Screening Tool (MST) Report. Patient reports ~30 lb weight loss last year from July to October when she moved here. She attributes this to a change in diet. Since October, weight has been stable. Intake at home is good. She eats 2-3 meals and occasional snacks at home. She was finishing up breakfast during RD visit.   Wt Readings from Last 15 Encounters:  05/06/20 73 kg  04/26/20 73.3 kg  02/10/20 73.8 kg  01/05/20 74.5 kg  12/30/19 74.8 kg  12/08/19 74.8 kg  10/03/19 77 kg    Body mass index is 28.52 kg/m. Patient meets criteria for overweight based on current BMI.   Nutrition focused physical exam completed.  No muscle or subcutaneous fat depletion noticed.  Current diet order is heart healthy, patient is consuming approximately 100% of meals at this time. Labs and medications reviewed.   No nutrition interventions warranted at this time. If nutrition issues arise, please consult RD.   Lucas Mallow, RD, LDN, CNSC Please refer to [facility] for contact information.

## 2020-05-07 NOTE — Evaluation (Signed)
Physical Therapy Evaluation Patient Details Name: Sarah [name] MRN: [id number] DOB: 1940/04/14 Today's Date: 05/07/2020   History of Present Illness  Sarah [name] is a 80 y/o female who reported to ED with complaints of body aches, chills, fever, and runny nose. Pt was admitted on 05/06/20 and diagnosed with sepsis with UTI. PMH includes hx of UTIs, chronic lymphocytic leukemia, breast cancer in remission, HTN, HLD, gout, and GERD.    Clinical Impression  Pt received in bed, cooperative and pleasant. Generally min A for all mobility. Reliant on UE support in standing. Follows commands consistently with increased time. Pt would benefit from PT to address strength, balance, endurance and increase independency. Pt with adequate DME and support at home. Recommend home health PT. Pt left on [facility] with call bell within reach and nursing aware of status. Will continue to follow acutely.     Follow Up Recommendations Home health PT;Supervision for mobility/OOB    Equipment Recommendations  None recommended by PT    Recommendations for Other Services       Precautions / Restrictions Precautions Precautions: Fall Restrictions Weight Bearing Restrictions: No      Mobility  Bed Mobility Overal bed mobility: Needs Assistance Bed Mobility: Supine to Sit     Supine to sit: Min assist     General bed mobility comments: Min A to bring trunk upright, able to scoot forwards on own    Transfers Overall transfer level: Needs assistance Equipment used: Rolling walker (2 wheeled) Transfers: Sit to/from [facility] Sit to Stand: Min assist Stand pivot transfers: Min guard       General transfer comment: Min A for power up into standing, min guard for safety during stand pivot to [facility]  Ambulation/Gait             General Gait Details: deferred  Stairs            Wheelchair Mobility    Modified Rankin (Stroke Patients Only)       Balance Overall balance  assessment: Needs assistance   Sitting balance-Leahy Scale: Fair       Standing balance-Leahy Scale: Poor Standing balance comment: Reliant on UE support                             Pertinent Vitals/Pain Pain Assessment: No/denies pain    Home Living Family/patient expects to be discharged to:: Private residence Living Arrangements: Children Available Help at Discharge: Available 24 hours/day Type of Home: House Home Access: Stairs to enter Entrance Stairs-Rails: None Entrance Stairs-Number of Steps: 2 Home Layout: Able to live on main level with bedroom/bathroom;Two level Home Equipment: Other (comment);Walker - 4 wheels;Shower seat;Bedside commode;Cane - single point (rollator)      Prior Function Level of Independence: Independent with assistive device(s)         Comments: Ues rollator. Daughter has fibromyalgia, daughter can help minimally as long as she doesn't exert herself. Son-in-law able to help getting into and out of house.     Hand Dominance        Extremity/Trunk Assessment   Upper Extremity Assessment Upper Extremity Assessment: Defer to OT evaluation    Lower Extremity Assessment Lower Extremity Assessment: Generalized weakness       Communication   Communication: No difficulties  Cognition Arousal/Alertness: Awake/alert Behavior During Therapy: WFL for tasks assessed/performed Overall Cognitive Status: Within Functional Limits for tasks assessed  General Comments General comments (skin integrity, edema, etc.): Minimal SOB but unable to get good signal from monitor for accurate reading of SPO2    Exercises     Assessment/Plan    PT Assessment Patient needs continued PT services  PT Problem List Decreased strength;Decreased knowledge of use of DME;Decreased activity tolerance;Decreased safety awareness;Decreased balance;Decreased mobility;Decreased coordination        PT Treatment Interventions DME instruction;Balance training;Gait training;Neuromuscular re-education;Stair training;Functional mobility training;Patient/family education;Therapeutic activities;Therapeutic exercise    PT Goals (Current goals can be found in the Care Plan section)  Acute Rehab PT Goals Patient Stated Goal: get stronger and go home PT Goal Formulation: With patient Time For Goal Achievement: 05/21/20 Potential to Achieve Goals: Good    Frequency Min 3X/week   Barriers to discharge        Co-evaluation               AM-PAC PT "6 Clicks" Mobility  Outcome Measure Help needed turning from your back to your side while in a flat bed without using bedrails?: A Little Help needed moving from lying on your back to sitting on the side of a flat bed without using bedrails?: A Little Help needed moving to and from a bed to a chair (including a wheelchair)?: A Little Help needed standing up from a chair using your arms (e.g., wheelchair or bedside chair)?: A Little Help needed to walk in hospital room?: A Little Help needed climbing 3-5 steps with a railing? : A Little 6 Click Score: 18    End of Session Equipment Utilized During Treatment: Gait belt Activity Tolerance: Patient tolerated treatment well Patient left: Other (comment) (Left on BSC, pt reported needing time to complete BM) Nurse Communication: Mobility status;Other (comment) (Left on BSC) PT Visit Diagnosis: Unsteadiness on feet (R26.81);Muscle weakness (generalized) (M62.81)    Time:  -      Charges:         Rosita Kea, SPT

## 2020-05-08 ENCOUNTER — Inpatient Hospital Stay (HOSPITAL_COMMUNITY): Payer: Medicare Other

## 2020-05-08 LAB — COMPREHENSIVE METABOLIC PANEL
ALT: 18 U/L (ref 0–44)
AST: 16 U/L (ref 15–41)
Albumin: 2.7 g/dL — ABNORMAL LOW (ref 3.5–5.0)
Alkaline Phosphatase: 127 U/L — ABNORMAL HIGH (ref 38–126)
Anion gap: 8 (ref 5–15)
BUN: 17 mg/dL (ref 8–23)
CO2: 23 mmol/L (ref 22–32)
Calcium: 8.7 mg/dL — ABNORMAL LOW (ref 8.9–10.3)
Chloride: 108 mmol/L (ref 98–111)
Creatinine, Ser: 1.13 mg/dL — ABNORMAL HIGH (ref 0.44–1.00)
GFR, Estimated: 49 mL/min — ABNORMAL LOW (ref >60)
Glucose, Bld: 113 mg/dL — ABNORMAL HIGH (ref 70–99)
Potassium: 4.4 mmol/L (ref 3.5–5.1)
Sodium: 139 mmol/L (ref 135–145)
Total Bilirubin: 0.5 mg/dL (ref 0.3–1.2)
Total Protein: 4.9 g/dL — ABNORMAL LOW (ref 6.5–8.1)

## 2020-05-08 LAB — CBC WITH DIFFERENTIAL/PLATELET
Abs Immature Granulocytes: 0.04 10*3/uL (ref 0.00–0.07)
Basophils Absolute: 0.1 10*3/uL (ref 0.0–0.1)
Basophils Relative: 0 %
Eosinophils Absolute: 0.3 10*3/uL (ref 0.0–0.5)
Eosinophils Relative: 1 %
HCT: 26.6 % — ABNORMAL LOW (ref 36.0–46.0)
Hemoglobin: 8.8 g/dL — ABNORMAL LOW (ref 12.0–15.0)
Immature Granulocytes: 0 %
Lymphocytes Relative: 89 %
Lymphs Abs: 23.5 10*3/uL — ABNORMAL HIGH (ref 0.7–4.0)
MCH: 33.2 pg (ref 26.0–34.0)
MCHC: 33.1 g/dL (ref 30.0–36.0)
MCV: 100.4 fL — ABNORMAL HIGH (ref 80.0–100.0)
Monocytes Absolute: 0.4 10*3/uL (ref 0.1–1.0)
Monocytes Relative: 1 %
Neutro Abs: 2.5 10*3/uL (ref 1.7–7.7)
Neutrophils Relative %: 9 %
Platelets: 195 10*3/uL (ref 150–400)
RBC: 2.65 MIL/uL — ABNORMAL LOW (ref 3.87–5.11)
RDW: 15.1 % (ref 11.5–15.5)
WBC: 26.8 10*3/uL — ABNORMAL HIGH (ref 4.0–10.5)
nRBC: 0 % (ref 0.0–0.2)

## 2020-05-08 LAB — BRAIN NATRIURETIC PEPTIDE: B Natriuretic Peptide: 252 pg/mL — ABNORMAL HIGH (ref 0.0–100.0)

## 2020-05-08 LAB — MAGNESIUM: Magnesium: 1.7 mg/dL (ref 1.7–2.4)

## 2020-05-08 LAB — PROCALCITONIN: Procalcitonin: 0.22 ng/mL

## 2020-05-08 MED ORDER — LACTATED RINGERS IV SOLN: INTRAVENOUS | Status: AC

## 2020-05-08 NOTE — Evaluation (Signed)
Occupational Therapy Evaluation Patient Details Name: Sarah [name] MRN: [id number] DOB: 12-Dec-1940 Today's Date: 05/08/2020    History of Present Illness Sarah [name] is a 80 y/o female who reported to ED with complaints of body aches, chills, fever, and runny nose. Pt was admitted on 05/06/20 and diagnosed with sepsis with UTI. PMH includes hx of UTIs, chronic lymphocytic leukemia, breast cancer in remission, HTN, HLD, gout, and GERD.   Clinical Impression   Pt was ambulating with a rollator and able to perform ADL independently. Pt presents with generalized weakness and decreased standing balance. She requires set up to mod assist for ADL. Pt plans to return home with her daughter, who can provide min assist and son in law who can help her in and out of the house. Will follow acutely.     Follow Up Recommendations  Home health OT;Supervision/Assistance - 24 hour    Equipment Recommendations  None recommended by OT    Recommendations for Other Services       Precautions / Restrictions Precautions Precautions: Fall      Mobility Bed Mobility Overal bed mobility: Needs Assistance Bed Mobility: Supine to Sit     Supine to sit: Min assist     General bed mobility comments: min assist to raise trunk, HOB up, increased time to scoot hips to EOB    Transfers Overall transfer level: Needs assistance Equipment used: Rolling walker (2 wheeled) Transfers: Sit to/from [facility] Sit to Stand: Min assist (to stabilize walker) Stand pivot transfers: Min assist       General transfer comment: steadying assist    Balance Overall balance assessment: Needs assistance   Sitting balance-Leahy Scale: Fair       Standing balance-Leahy Scale: Poor Standing balance comment: Reliant on UE support                           ADL either performed or assessed with clinical judgement   ADL Overall ADL's : Needs assistance/impaired Eating/Feeding: Independent    Grooming: Standing;Wash/dry hands;Min guard   Upper Body Bathing: Set up;Sitting   Lower Body Bathing: Moderate assistance;Sit to/from stand   Upper Body Dressing : Sitting;Minimal assistance   Lower Body Dressing: Moderate assistance;Sit to/from stand   Toilet Transfer: Minimal assistance;Stand-pivot;RW;BSC   Toileting- Clothing Manipulation and Hygiene: Minimal assistance;Sit to/from stand       Functional mobility during ADLs: Minimal assistance;Rolling walker       Vision Baseline Vision/History: Wears glasses Wears Glasses: At all times Patient Visual Report: No change from baseline       Perception     Praxis      Pertinent Vitals/Pain Pain Assessment: No/denies pain     Hand Dominance Right   Extremity/Trunk Assessment Upper Extremity Assessment Upper Extremity Assessment: Overall WFL for tasks assessed   Lower Extremity Assessment Lower Extremity Assessment: Defer to PT evaluation   Cervical / Trunk Assessment Cervical / Trunk Assessment: Kyphotic   Communication Communication Communication: No difficulties   Cognition Arousal/Alertness: Awake/alert Behavior During Therapy: WFL for tasks assessed/performed Overall Cognitive Status: Within Functional Limits for tasks assessed                                     General Comments       Exercises     Shoulder Instructions      Home Living Family/patient expects to be  discharged to:: Private residence Living Arrangements: Children Available Help at Discharge: Available 24 hours/day (daughter and son in law) Type of Home: House Home Access: Stairs to enter Technical brewer of Steps: 2 Entrance Stairs-Rails: None Home Layout: Able to live on main level with bedroom/bathroom;Two level     Bathroom Shower/Tub: Teacher, early years/pre: Handicapped height     Home Equipment: Environmental consultant - 4 wheels;Shower seat;Bedside commode;Cane - single point          Prior  Functioning/Environment Level of Independence: Independent with assistive device(s)        Comments: Uses rollator. Daughter has fibromyalgia, daughter can help minimally as long as she doesn't exert herself. Son-in-law able to help getting into and out of house.        OT Problem List: Decreased strength;Impaired balance (sitting and/or standing)      OT Treatment/Interventions: Self-care/ADL training;DME and/or AE instruction;Patient/family education;Balance training;Therapeutic activities    OT Goals(Current goals can be found in the care plan section) Acute Rehab OT Goals Patient Stated Goal: get stronger and go home OT Goal Formulation: With patient Time For Goal Achievement: 05/22/20 Potential to Achieve Goals: Good ADL Goals Pt Will Perform Grooming: with supervision;standing (3 activities) Pt Will Perform Upper Body Bathing: with set-up;sitting Pt Will Perform Lower Body Bathing: with supervision;sit to/from stand;with adaptive equipment Pt Will Perform Upper Body Dressing: with set-up;sitting Pt Will Perform Lower Body Dressing: with supervision;with adaptive equipment;sit to/from stand Pt Will Transfer to Toilet: with supervision;ambulating;bedside commode (over toilet) Pt Will Perform Toileting - Clothing Manipulation and hygiene: with supervision;sit to/from stand  OT Frequency: Min 2X/week   Barriers to D/C:            Co-evaluation              AM-PAC OT "6 Clicks" Daily Activity     Outcome Measure Help from another person eating meals?: None Help from another person taking care of personal grooming?: A Little Help from another person toileting, which includes using toliet, bedpan, or urinal?: A Little Help from another person bathing (including washing, rinsing, drying)?: A Lot Help from another person to put on and taking off regular upper body clothing?: A Little Help from another person to put on and taking off regular lower body clothing?: A Lot 6  Click Score: 17   End of Session Equipment Utilized During Treatment: Rolling walker;Gait belt Nurse Communication:  (NT aware pt is on commode)  Activity Tolerance: Patient tolerated treatment well Patient left: with call bell/phone within reach (on [facility])  OT Visit Diagnosis: Unsteadiness on feet (R26.81);Other abnormalities of gait and mobility (R26.89);Muscle weakness (generalized) (M62.81)                Time: 6734-1937 OT Time Calculation (min): 37 min Charges:  OT General Charges $OT Visit: 1 Visit OT Evaluation $OT Eval Moderate Complexity: 1 Mod OT Treatments $Self Care/Home Management : 8-22 mins  Nestor Lewandowsky, OTR/L Acute Rehabilitation Services Pager: [phone] Office: [phone]  Malka So 05/08/2020, 12:31 PM

## 2020-05-08 NOTE — H&P (View-Only) (Signed)
Subjective:  CC: Hydronephrosis  Consult requested by Dr. Lala Lund  Sarah [name] is an 80 yo female who has been seen by Dr. Gloriann Loan in January and February for recurrent UTI's.   She is now admitted with sepsis and a UTI.  She has had fever and chills but no flank pain or nausea.   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   There is no significant stranding or inflammatory change around the kidney.   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 Tera Helper,  in [place].   She has had no recent hematuria or dysuria.   Her fever curve is improving along with her renal function and leukocytosis.   She has a normal lactic acid level.  Urine culture is pending but she had e. Coli in November.  ROS:  Review of Systems  All other systems reviewed and are negative.   Allergies  Allergen Reactions  . Amoxicillin   . Augmentin [Amoxicillin-Pot Clavulanate]     GI upset   . Drug [Tape]   . Sulfa Antibiotics     Childhood   . Atenolol Rash  . Macrodantin [Nitrofurantoin] Rash    Past Medical History:  Diagnosis Date  . Allergy   . CLL (chronic lymphocytic leukemia) ([place])   . GERD (gastroesophageal reflux disease)   . Gout   . HLD (hyperlipidemia)   . Hypertension     Past Surgical History:  Procedure Laterality Date  . ABDOMINAL HYSTERECTOMY    . APPENDECTOMY    . CHOLECYSTECTOMY    . KIDNEY SURGERY    . MOHS SURGERY      Social History   Socioeconomic History  . Marital status: Widowed    Spouse name: Not on file  . Number of children: Not on file  . Years  of education: Not on file  . Highest education level: Not on file  Occupational History  . Not on file  Tobacco Use  . Smoking status: Never Smoker  . Smokeless tobacco: Never Used  Vaping Use  . Vaping Use: Never used  Substance and Sexual Activity  . Alcohol use: Not Currently    Comment: Rare social drinking  . Drug use: Never  . Sexual activity: Not Currently  Other Topics Concern  . Not on file  Social History Narrative  . Not on file   Social Determinants of Health   Financial Resource Strain: Low Risk   . Difficulty of Paying Living Expenses: Not hard at all  Food Insecurity: No Food Insecurity  . Worried About Charity fundraiser in the Last Year: Never true  . Ran Out of Food in the Last Year: Never true  Transportation Needs: No Transportation Needs  . Lack of Transportation (Medical): No  . Lack of Transportation (Non-Medical): No  Physical Activity: Inactive  . Days of Exercise per Week: 0 days  . Minutes of Exercise per Session: 0 min  Stress: No Stress Concern Present  . Feeling of Stress : Not at all  Social Connections: Moderately Isolated  . Frequency of Communication with Friends  and Family: More than three times a week  . Frequency of Social Gatherings with Friends and Family: Once a week  . Attends Religious Services: 1 to 4 times per year  . Active Member of Clubs or Organizations: No  . Attends Archivist Meetings: Never  . Marital Status: Widowed  Intimate Partner Violence: Not At Risk  . Fear of Current or Ex-Partner: No  . Emotionally Abused: No  . Physically Abused: No  . Sexually Abused: No    Family History  Problem Relation Age of Onset  . Colon cancer Mother   . Heart attack Father     Anti-infectives: Anti-infectives (From admission, onward)   Start     Dose/Rate Route Frequency Ordered Stop   05/07/20 1600  cefTRIAXone (ROCEPHIN) 1 g in sodium chloride 0.9 % 100 mL IVPB  Status:  Discontinued        1 g 200 mL/hr  over 30 Minutes Intravenous Every 24 hours 05/06/20 2030 05/07/20 0736   05/07/20 0800  cefTRIAXone (ROCEPHIN) 2 g in sodium chloride 0.9 % 100 mL IVPB        2 g 200 mL/hr over 30 Minutes Intravenous Every 24 hours 05/07/20 0736     05/06/20 1645  cefTRIAXone (ROCEPHIN) 1 g in sodium chloride 0.9 % 100 mL IVPB        1 g 200 mL/hr over 30 Minutes Intravenous  Once 05/06/20 1636 05/06/20 1753      Current Facility-Administered Medications  Medication Dose Route Frequency Provider Last Rate Last Admin  . acetaminophen (TYLENOL) tablet 650 mg  650 mg Oral Q6H PRN Shela Leff, MD   650 mg at 05/08/20 0131   Or  . acetaminophen (TYLENOL) suppository 650 mg  650 mg Rectal Q6H PRN Shela Leff, MD      . busPIRone (BUSPAR) tablet 15 mg  15 mg Oral BID Thurnell Lose, MD   15 mg at 05/08/20 0909  . cefTRIAXone (ROCEPHIN) 2 g in sodium chloride 0.9 % 100 mL IVPB  2 g Intravenous Q24H Thurnell Lose, MD 200 mL/hr at 05/08/20 0909 2 g at 05/08/20 0909  . enoxaparin (LOVENOX) injection 30 mg  30 mg Subcutaneous Q24H Shela Leff, MD   30 mg at 05/07/20 2206  . ezetimibe (ZETIA) tablet 10 mg  10 mg Oral Daily Thurnell Lose, MD   10 mg at 05/08/20 0909  . feeding supplement (ENSURE ENLIVE / ENSURE PLUS) liquid 237 mL  237 mL Oral BID BM Caren Griffins, MD   237 mL at 05/08/20 0914  . lactated ringers infusion   Intravenous Continuous Thurnell Lose, MD 50 mL/hr at 05/08/20 0910 New Bag at 05/08/20 0910  . lidocaine (LIDODERM) 5 % 1 patch  1 patch Transdermal Q24H Zierle-Ghosh, Asia B, DO   1 patch at 05/07/20 0907  . loperamide (IMODIUM) capsule 2 mg  2 mg Oral Q6H PRN Thurnell Lose, MD      . pantoprazole (PROTONIX) EC tablet 40 mg  40 mg Oral Daily Thurnell Lose, MD   40 mg at 05/08/20 0909  . potassium chloride (KLOR-CON) CR tablet 10 mEq  10 mEq Oral Daily Thurnell Lose, MD   10 mEq at 05/08/20 0909  . venlafaxine XR (EFFEXOR-XR) 24 hr capsule 150 mg   150 mg Oral Q breakfast Thurnell Lose, MD   150 mg at 05/08/20 0909     Objective: Vital signs in last 24 hours: BP Marland Kitchen)  143/63 (BP Location: Right Arm)   Pulse 89   Temp 99.6 F (37.6 C) (Oral)   Resp 19   Ht 5\' 3"  (1.6 m)   Wt 76.2 kg   SpO2 96%   BMI 29.76 kg/m   Intake/Output from previous day: 03/18 0701 - 03/19 0700 In: 3517.9 [P.O.:480; I.V.:2937.9; IV Piggyback:100] Out: 1950 [YCXKG:8185] Intake/Output this shift: No intake/output data recorded.   Physical Exam Vitals reviewed.  Constitutional:      Appearance: Normal appearance.  Abdominal:     General: Abdomen is flat.     Palpations: Abdomen is soft.     Tenderness: There is abdominal tenderness (very minimal left flank tenderness. ).  Neurological:     Mental Status: She is alert.     Lab Results:  Results for orders placed or performed during the hospital encounter of 05/06/20 (from the past 24 hour(s))  Magnesium     Status: None   Collection Time: 05/08/20  2:21 AM  Result Value Ref Range   Magnesium 1.7 1.7 - 2.4 mg/dL  Brain natriuretic peptide     Status: Abnormal   Collection Time: 05/08/20  2:21 AM  Result Value Ref Range   B Natriuretic Peptide 252.0 (H) 0.0 - 100.0 pg/mL  CBC with Differential/Platelet     Status: Abnormal   Collection Time: 05/08/20  2:21 AM  Result Value Ref Range   WBC 26.8 (H) 4.0 - 10.5 K/uL   RBC 2.65 (L) 3.87 - 5.11 MIL/uL   Hemoglobin 8.8 (L) 12.0 - 15.0 g/dL   HCT 26.6 (L) 36.0 - 46.0 %   MCV 100.4 (H) 80.0 - 100.0 fL   MCH 33.2 26.0 - 34.0 pg   MCHC 33.1 30.0 - 36.0 g/dL   RDW 15.1 11.5 - 15.5 %   Platelets 195 150 - 400 K/uL   nRBC 0.0 0.0 - 0.2 %   Neutrophils Relative % 9 %   Neutro Abs 2.5 1.7 - 7.7 K/uL   Lymphocytes Relative 89 %   Lymphs Abs 23.5 (H) 0.7 - 4.0 K/uL   Monocytes Relative 1 %   Monocytes Absolute 0.4 0.1 - 1.0 K/uL   Eosinophils Relative 1 %   Eosinophils Absolute 0.3 0.0 - 0.5 K/uL   Basophils Relative 0 %   Basophils  Absolute 0.1 0.0 - 0.1 K/uL   Immature Granulocytes 0 %   Abs Immature Granulocytes 0.04 0.00 - 0.07 K/uL  Comprehensive metabolic panel     Status: Abnormal   Collection Time: 05/08/20  2:21 AM  Result Value Ref Range   Sodium 139 135 - 145 mmol/L   Potassium 4.4 3.5 - 5.1 mmol/L   Chloride 108 98 - 111 mmol/L   CO2 23 22 - 32 mmol/L   Glucose, Bld 113 (H) 70 - 99 mg/dL   BUN 17 8 - 23 mg/dL   Creatinine, Ser 1.13 (H) 0.44 - 1.00 mg/dL   Calcium 8.7 (L) 8.9 - 10.3 mg/dL   Total Protein 4.9 (L) 6.5 - 8.1 g/dL   Albumin 2.7 (L) 3.5 - 5.0 g/dL   AST 16 15 - 41 U/L   ALT 18 0 - 44 U/L   Alkaline Phosphatase 127 (H) 38 - 126 U/L   Total Bilirubin 0.5 0.3 - 1.2 mg/dL   GFR, Estimated 49 (L) >60 mL/min   Anion gap 8 5 - 15  Procalcitonin     Status: None   Collection Time: 05/08/20  2:21 AM  Result Value Ref  Range   Procalcitonin 0.22 ng/mL    BMET Recent Labs    05/07/20 0139 05/08/20 0221  NA 135 139  K 4.3 4.4  CL 107 108  CO2 21* 23  GLUCOSE 129* 113*  BUN 28* 17  CREATININE 1.29* 1.13*  CALCIUM 8.3* 8.7*   PT/INR No results for input(s): LABPROT, INR in the last 72 hours. ABG No results for input(s): PHART, HCO3 in the last 72 hours.  Invalid input(s): PCO2, PO2  Studies/Results: CT ABDOMEN PELVIS WO CONTRAST  Result Date: 05/08/2020 CLINICAL DATA:  79-year-old female with pyelonephritis. EXAM: CT ABDOMEN AND PELVIS WITHOUT CONTRAST TECHNIQUE: Multidetector CT imaging of the abdomen and pelvis was performed following the standard protocol without IV contrast. COMPARISON:  Renal ultrasound yesterday. FINDINGS: Lower chest: Mild cardiomegaly. Tortuous descending thoracic aorta. No pericardial effusion. Minimal lung base atelectasis. Hepatobiliary: Mildly nodular liver contour. Circumscribed round low-density area measuring 12 mm on series 3, image 21 has simple fluid density and is likely a benign cyst. No other discrete liver lesion on this noncontrast exam.  Diminutive or absent gallbladder. Pancreas: Negative noncontrast pancreas. Spleen: Splenomegaly. Estimated splenic volume 535 mL (normal splenic volume range 83 - 412 mL). No discrete splenic lesion. Adrenals/Urinary Tract: Negative adrenal glands. Right kidney appears nonobstructed although there is a round 9 mm calculus in the right renal pelvis (series 6, image 82). Right ureter is decompressed. The distal ureter is difficult to delineate but appears to remain decompressed to the to the bladder. There is moderate to severe left hydronephrosis. But the left ureteropelvic junction appears abruptly tapered on coronal image 79. Only a punctate left intrarenal calculus is identified. No left hydroureter is evident. The left UVJ appears normal. Diminutive urinary bladder, with mild bladder wall thickening in the region of sigmoid colon inflammatory stranding. See below. Numerous pelvic phleboliths. Stomach/Bowel: Retained stool in the rectum with no rectal wall thickening or inflammation identified. However, the upstream sigmoid colon is thickened and with mild mesenteric stranding as seen on coronal image 68 which extends to the nearby bladder dome. There are diverticula in the region. This inflammation is in a segment of fiber 6 cm. Diverticulosis in the upstream descending colon without active inflammation. Mild retained stool proximal to the sigmoid. Diminutive or absent appendix. Terminal ileum also remarkable for fairly extensive diverticulosis which tracks into the upstream distal small bowel. No dilated small bowel. No free air or free fluid. No other convincing acute bowel inflammation. Stomach and duodenum are decompressed. Vascular/Lymphatic: Aortoiliac calcified atherosclerosis. Tortuous abdominal aorta. Vascular patency is not evaluated in the absence of IV contrast. Additionally, there is nonspecific appearing lymphadenopathy in the small bowel mesentery (coronal images 54-59) with individual lymph nodes  up to 13 mm short axis. Associated haziness of the root of the mesentery. Furthermore, there is bulky left side retroperitoneal lymphadenopathy, with individual pararenal nodes up to 23 mm short axis. The lymphadenopathy abates at the pelvic inlet. No inguinal lymphadenopathy. Reproductive: Absent uterus.  Diminutive or absent ovaries. Other: Mild presacral stranding is nonspecific. No pelvic free fluid. Musculoskeletal: Several chronic left posterior rib fractures. Grade 1 degenerative lower lumbar spondylolisthesis with severe facet degeneration. Severe chronic disc degeneration at the lumbosacral junction. No acute osseous abnormality identified. IMPRESSION: 1. Moderate to severe retroperitoneal and mesenteric lymphadenopathy suspicious for Lymphoma, Leukemia, or less likely other lymphoproliferative disorder. A CT guided left pararenal lymph node needle biopsy should be feasible. 2. Sigmoid colon appears inflamed compatible with acute diverticulitis or colitis. Mild secondary inflammation of   the nearby Bladder Dome. No evidence of colovesical fistula or abscess. Underlying extensive small bowel diverticulosis. But no other bowel inflammation identified on this noncontrast exam. 3. Moderate to severe left hydronephrosis but with abrupt tapering at the left ureteropelvic junction suggesting UPJ stenosis. Right greater than left nephrolithiasis but no obstructing calculus is identified. No strong evidence of pyelonephritis, although IV contrast would be necessary for that CT diagnosis. 4. Splenomegaly, which may be secondary to #1 rather than due to chronic liver disease. 5. Aortic Atherosclerosis (ICD10-I70.0). Electronically Signed   By: Genevie Ann M.D.   On: 05/08/2020 09:16   US RENAL  Result Date: 05/07/2020 CLINICAL DATA:  Pyelonephritis. EXAM: RENAL / URINARY TRACT ULTRASOUND COMPLETE COMPARISON:  None. FINDINGS: Right Kidney: Renal measurements: 11.4 x 5.1 x 5.1 cm = volume: 154.6 mL. Echogenicity within  normal limits. No mass or hydronephrosis visualized. Left Kidney: Renal measurements: 10.8 x 5.3 x 6.6 cm = volume: 198.8 mL. Moderate hydronephrosis identified on the left. Bladder: Appears normal for degree of bladder distention. Other: The spleen is enlarged measuring 13.8 x 12.7 x 5.8 cm with a volume of 537 cc. IMPRESSION: 1. Moderate left hydronephrosis of uncertain chronicity. Acute/active obstruction not excluded on this study. Recommend clinical correlation. CT imaging could further evaluate for an underlying cause. 2. Splenomegaly. Electronically Signed   By: Dorise Bullion III M.D   On: 05/07/2020 16:55   DG Chest Portable 1 View  Result Date: 05/06/2020 CLINICAL DATA:  Fever, chills and cough. EXAM: PORTABLE CHEST 1 VIEW COMPARISON:  None. FINDINGS: The lungs are hyperinflated. There is no evidence of acute infiltrate, pleural effusion or pneumothorax. The heart size and mediastinal contours are within normal limits. Multiple chronic left-sided rib fractures are seen. A radiopaque fusion plate and screws are seen within the proximal right humerus. IMPRESSION: No active cardiopulmonary disease. Electronically Signed   By: Virgina Norfolk M.D.   On: 05/06/2020 15:41   I have reviewed our office notes, the hospital notes and her labs and imaging and discussed the CT with radiology.    Assessment/Plan: Sepsis with recurrent UTI's.  She is improving on current therapy and doesn't need acute intervention to decompress the kidney.   History of left UPJ obstruction with prior pyeloplasty.   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   We need to get her  records from [place], particular CT's or PET's to see if the dilation is chronic and also do a lasix renogram to determine whether there is true obstruction vs chronic dilation.   A CT with contrast could be considered but is not as reliable at determining obstruction.  Urinary incontinence.   She has had several injections of a bulking agent in 2020 but still has significant incontinence.   There is some slightly high density material at the level of the bladder neck and urethra that is probably the residual material.   This can sometimes erode into the urethra and become a focus for recurrent UTI's.  She will need cystoscopy at some point.   We will need to get her prior Urology records from [place] as well.        No follow-ups  on file.    CC: Dr. Lala Lund and Dr. Lafayette Dragon.      Irine Seal 05/08/2020 [phone]

## 2020-05-08 NOTE — Consult Note (Signed)
Subjective:  CC: Hydronephrosis  Consult requested by Dr. Lala Lund  Sarah [name] is an 80 yo female who has been seen by Dr. Gloriann Loan in January and February for recurrent UTI's.   She is now admitted with sepsis and a UTI.  She has had fever and chills but no flank pain or nausea.   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   There is no significant stranding or inflammatory change around the kidney.   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 Tera Helper,  in [place].   She has had no recent hematuria or dysuria.   Her fever curve is improving along with her renal function and leukocytosis.   She has a normal lactic acid level.  Urine culture is pending but she had e. Coli in November.  ROS:  Review of Systems  All other systems reviewed and are negative.   Allergies  Allergen Reactions  . Amoxicillin   . Augmentin [Amoxicillin-Pot Clavulanate]     GI upset   . Drug [Tape]   . Sulfa Antibiotics     Childhood   . Atenolol Rash  . Macrodantin [Nitrofurantoin] Rash    Past Medical History:  Diagnosis Date  . Allergy   . CLL (chronic lymphocytic leukemia) ([place])   . GERD (gastroesophageal reflux disease)   . Gout   . HLD (hyperlipidemia)   . Hypertension     Past Surgical History:  Procedure Laterality Date  . ABDOMINAL HYSTERECTOMY    . APPENDECTOMY    . CHOLECYSTECTOMY    . KIDNEY SURGERY    . MOHS SURGERY      Social History   Socioeconomic History  . Marital status: Widowed    Spouse name: Not on file  . Number of children: Not on file  . Years  of education: Not on file  . Highest education level: Not on file  Occupational History  . Not on file  Tobacco Use  . Smoking status: Never Smoker  . Smokeless tobacco: Never Used  Vaping Use  . Vaping Use: Never used  Substance and Sexual Activity  . Alcohol use: Not Currently    Comment: Rare social drinking  . Drug use: Never  . Sexual activity: Not Currently  Other Topics Concern  . Not on file  Social History Narrative  . Not on file   Social Determinants of Health   Financial Resource Strain: Low Risk   . Difficulty of Paying Living Expenses: Not hard at all  Food Insecurity: No Food Insecurity  . Worried About Charity fundraiser in the Last Year: Never true  . Ran Out of Food in the Last Year: Never true  Transportation Needs: No Transportation Needs  . Lack of Transportation (Medical): No  . Lack of Transportation (Non-Medical): No  Physical Activity: Inactive  . Days of Exercise per Week: 0 days  . Minutes of Exercise per Session: 0 min  Stress: No Stress Concern Present  . Feeling of Stress : Not at all  Social Connections: Moderately Isolated  . Frequency of Communication with Friends  and Family: More than three times a week  . Frequency of Social Gatherings with Friends and Family: Once a week  . Attends Religious Services: 1 to 4 times per year  . Active Member of Clubs or Organizations: No  . Attends Archivist Meetings: Never  . Marital Status: Widowed  Intimate Partner Violence: Not At Risk  . Fear of Current or Ex-Partner: No  . Emotionally Abused: No  . Physically Abused: No  . Sexually Abused: No    Family History  Problem Relation Age of Onset  . Colon cancer Mother   . Heart attack Father     Anti-infectives: Anti-infectives (From admission, onward)   Start     Dose/Rate Route Frequency Ordered Stop   05/07/20 1600  cefTRIAXone (ROCEPHIN) 1 g in sodium chloride 0.9 % 100 mL IVPB  Status:  Discontinued        1 g 200 mL/hr  over 30 Minutes Intravenous Every 24 hours 05/06/20 2030 05/07/20 0736   05/07/20 0800  cefTRIAXone (ROCEPHIN) 2 g in sodium chloride 0.9 % 100 mL IVPB        2 g 200 mL/hr over 30 Minutes Intravenous Every 24 hours 05/07/20 0736     05/06/20 1645  cefTRIAXone (ROCEPHIN) 1 g in sodium chloride 0.9 % 100 mL IVPB        1 g 200 mL/hr over 30 Minutes Intravenous  Once 05/06/20 1636 05/06/20 1753      Current Facility-Administered Medications  Medication Dose Route Frequency Provider Last Rate Last Admin  . acetaminophen (TYLENOL) tablet 650 mg  650 mg Oral Q6H PRN Shela Leff, MD   650 mg at 05/08/20 0131   Or  . acetaminophen (TYLENOL) suppository 650 mg  650 mg Rectal Q6H PRN Shela Leff, MD      . busPIRone (BUSPAR) tablet 15 mg  15 mg Oral BID Thurnell Lose, MD   15 mg at 05/08/20 0909  . cefTRIAXone (ROCEPHIN) 2 g in sodium chloride 0.9 % 100 mL IVPB  2 g Intravenous Q24H Thurnell Lose, MD 200 mL/hr at 05/08/20 0909 2 g at 05/08/20 0909  . enoxaparin (LOVENOX) injection 30 mg  30 mg Subcutaneous Q24H Shela Leff, MD   30 mg at 05/07/20 2206  . ezetimibe (ZETIA) tablet 10 mg  10 mg Oral Daily Thurnell Lose, MD   10 mg at 05/08/20 0909  . feeding supplement (ENSURE ENLIVE / ENSURE PLUS) liquid 237 mL  237 mL Oral BID BM Caren Griffins, MD   237 mL at 05/08/20 0914  . lactated ringers infusion   Intravenous Continuous Thurnell Lose, MD 50 mL/hr at 05/08/20 0910 New Bag at 05/08/20 0910  . lidocaine (LIDODERM) 5 % 1 patch  1 patch Transdermal Q24H Zierle-Ghosh, Asia B, DO   1 patch at 05/07/20 0907  . loperamide (IMODIUM) capsule 2 mg  2 mg Oral Q6H PRN Thurnell Lose, MD      . pantoprazole (PROTONIX) EC tablet 40 mg  40 mg Oral Daily Thurnell Lose, MD   40 mg at 05/08/20 0909  . potassium chloride (KLOR-CON) CR tablet 10 mEq  10 mEq Oral Daily Thurnell Lose, MD   10 mEq at 05/08/20 0909  . venlafaxine XR (EFFEXOR-XR) 24 hr capsule 150 mg   150 mg Oral Q breakfast Thurnell Lose, MD   150 mg at 05/08/20 0909     Objective: Vital signs in last 24 hours: BP Marland Kitchen)  143/63 (BP Location: Right Arm)   Pulse 89   Temp 99.6 F (37.6 C) (Oral)   Resp 19   Ht 5\' 3"  (1.6 m)   Wt 76.2 kg   SpO2 96%   BMI 29.76 kg/m   Intake/Output from previous day: 03/18 0701 - 03/19 0700 In: 3517.9 [P.O.:480; I.V.:2937.9; IV Piggyback:100] Out: 1950 [GLOVF:6433] Intake/Output this shift: No intake/output data recorded.   Physical Exam Vitals reviewed.  Constitutional:      Appearance: Normal appearance.  Abdominal:     General: Abdomen is flat.     Palpations: Abdomen is soft.     Tenderness: There is abdominal tenderness (very minimal left flank tenderness. ).  Neurological:     Mental Status: She is alert.     Lab Results:  Results for orders placed or performed during the hospital encounter of 05/06/20 (from the past 24 hour(s))  Magnesium     Status: None   Collection Time: 05/08/20  2:21 AM  Result Value Ref Range   Magnesium 1.7 1.7 - 2.4 mg/dL  Brain natriuretic peptide     Status: Abnormal   Collection Time: 05/08/20  2:21 AM  Result Value Ref Range   B Natriuretic Peptide 252.0 (H) 0.0 - 100.0 pg/mL  CBC with Differential/Platelet     Status: Abnormal   Collection Time: 05/08/20  2:21 AM  Result Value Ref Range   WBC 26.8 (H) 4.0 - 10.5 K/uL   RBC 2.65 (L) 3.87 - 5.11 MIL/uL   Hemoglobin 8.8 (L) 12.0 - 15.0 g/dL   HCT 26.6 (L) 36.0 - 46.0 %   MCV 100.4 (H) 80.0 - 100.0 fL   MCH 33.2 26.0 - 34.0 pg   MCHC 33.1 30.0 - 36.0 g/dL   RDW 15.1 11.5 - 15.5 %   Platelets 195 150 - 400 K/uL   nRBC 0.0 0.0 - 0.2 %   Neutrophils Relative % 9 %   Neutro Abs 2.5 1.7 - 7.7 K/uL   Lymphocytes Relative 89 %   Lymphs Abs 23.5 (H) 0.7 - 4.0 K/uL   Monocytes Relative 1 %   Monocytes Absolute 0.4 0.1 - 1.0 K/uL   Eosinophils Relative 1 %   Eosinophils Absolute 0.3 0.0 - 0.5 K/uL   Basophils Relative 0 %   Basophils  Absolute 0.1 0.0 - 0.1 K/uL   Immature Granulocytes 0 %   Abs Immature Granulocytes 0.04 0.00 - 0.07 K/uL  Comprehensive metabolic panel     Status: Abnormal   Collection Time: 05/08/20  2:21 AM  Result Value Ref Range   Sodium 139 135 - 145 mmol/L   Potassium 4.4 3.5 - 5.1 mmol/L   Chloride 108 98 - 111 mmol/L   CO2 23 22 - 32 mmol/L   Glucose, Bld 113 (H) 70 - 99 mg/dL   BUN 17 8 - 23 mg/dL   Creatinine, Ser 1.13 (H) 0.44 - 1.00 mg/dL   Calcium 8.7 (L) 8.9 - 10.3 mg/dL   Total Protein 4.9 (L) 6.5 - 8.1 g/dL   Albumin 2.7 (L) 3.5 - 5.0 g/dL   AST 16 15 - 41 U/L   ALT 18 0 - 44 U/L   Alkaline Phosphatase 127 (H) 38 - 126 U/L   Total Bilirubin 0.5 0.3 - 1.2 mg/dL   GFR, Estimated 49 (L) >60 mL/min   Anion gap 8 5 - 15  Procalcitonin     Status: None   Collection Time: 05/08/20  2:21 AM  Result Value Ref  Range   Procalcitonin 0.22 ng/mL    BMET Recent Labs    05/07/20 0139 05/08/20 0221  NA 135 139  K 4.3 4.4  CL 107 108  CO2 21* 23  GLUCOSE 129* 113*  BUN 28* 17  CREATININE 1.29* 1.13*  CALCIUM 8.3* 8.7*   PT/INR No results for input(s): LABPROT, INR in the last 72 hours. ABG No results for input(s): PHART, HCO3 in the last 72 hours.  Invalid input(s): PCO2, PO2  Studies/Results: CT ABDOMEN PELVIS WO CONTRAST  Result Date: 05/08/2020 CLINICAL DATA:  80 year old female with pyelonephritis. EXAM: CT ABDOMEN AND PELVIS WITHOUT CONTRAST TECHNIQUE: Multidetector CT imaging of the abdomen and pelvis was performed following the standard protocol without IV contrast. COMPARISON:  Renal ultrasound yesterday. FINDINGS: Lower chest: Mild cardiomegaly. Tortuous descending thoracic aorta. No pericardial effusion. Minimal lung base atelectasis. Hepatobiliary: Mildly nodular liver contour. Circumscribed round low-density area measuring 12 mm on series 3, image 21 has simple fluid density and is likely a benign cyst. No other discrete liver lesion on this noncontrast exam.  Diminutive or absent gallbladder. Pancreas: Negative noncontrast pancreas. Spleen: Splenomegaly. Estimated splenic volume 535 mL (normal splenic volume range 83 - 412 mL). No discrete splenic lesion. Adrenals/Urinary Tract: Negative adrenal glands. Right kidney appears nonobstructed although there is a round 9 mm calculus in the right renal pelvis (series 6, image 82). Right ureter is decompressed. The distal ureter is difficult to delineate but appears to remain decompressed to the to the bladder. There is moderate to severe left hydronephrosis. But the left ureteropelvic junction appears abruptly tapered on coronal image 79. Only a punctate left intrarenal calculus is identified. No left hydroureter is evident. The left UVJ appears normal. Diminutive urinary bladder, with mild bladder wall thickening in the region of sigmoid colon inflammatory stranding. See below. Numerous pelvic phleboliths. Stomach/Bowel: Retained stool in the rectum with no rectal wall thickening or inflammation identified. However, the upstream sigmoid colon is thickened and with mild mesenteric stranding as seen on coronal image 68 which extends to the nearby bladder dome. There are diverticula in the region. This inflammation is in a segment of fiber 6 cm. Diverticulosis in the upstream descending colon without active inflammation. Mild retained stool proximal to the sigmoid. Diminutive or absent appendix. Terminal ileum also remarkable for fairly extensive diverticulosis which tracks into the upstream distal small bowel. No dilated small bowel. No free air or free fluid. No other convincing acute bowel inflammation. Stomach and duodenum are decompressed. Vascular/Lymphatic: Aortoiliac calcified atherosclerosis. Tortuous abdominal aorta. Vascular patency is not evaluated in the absence of IV contrast. Additionally, there is nonspecific appearing lymphadenopathy in the small bowel mesentery (coronal images 54-59) with individual lymph nodes  up to 13 mm short axis. Associated haziness of the root of the mesentery. Furthermore, there is bulky left side retroperitoneal lymphadenopathy, with individual pararenal nodes up to 23 mm short axis. The lymphadenopathy abates at the pelvic inlet. No inguinal lymphadenopathy. Reproductive: Absent uterus.  Diminutive or absent ovaries. Other: Mild presacral stranding is nonspecific. No pelvic free fluid. Musculoskeletal: Several chronic left posterior rib fractures. Grade 1 degenerative lower lumbar spondylolisthesis with severe facet degeneration. Severe chronic disc degeneration at the lumbosacral junction. No acute osseous abnormality identified. IMPRESSION: 1. Moderate to severe retroperitoneal and mesenteric lymphadenopathy suspicious for Lymphoma, Leukemia, or less likely other lymphoproliferative disorder. A CT guided left pararenal lymph node needle biopsy should be feasible. 2. Sigmoid colon appears inflamed compatible with acute diverticulitis or colitis. Mild secondary inflammation of  the nearby Bladder Dome. No evidence of colovesical fistula or abscess. Underlying extensive small bowel diverticulosis. But no other bowel inflammation identified on this noncontrast exam. 3. Moderate to severe left hydronephrosis but with abrupt tapering at the left ureteropelvic junction suggesting UPJ stenosis. Right greater than left nephrolithiasis but no obstructing calculus is identified. No strong evidence of pyelonephritis, although IV contrast would be necessary for that CT diagnosis. 4. Splenomegaly, which may be secondary to #1 rather than due to chronic liver disease. 5. Aortic Atherosclerosis (ICD10-I70.0). Electronically Signed   By: Genevie Ann M.D.   On: 05/08/2020 09:16   US RENAL  Result Date: 05/07/2020 CLINICAL DATA:  Pyelonephritis. EXAM: RENAL / URINARY TRACT ULTRASOUND COMPLETE COMPARISON:  None. FINDINGS: Right Kidney: Renal measurements: 11.4 x 5.1 x 5.1 cm = volume: 154.6 mL. Echogenicity within  normal limits. No mass or hydronephrosis visualized. Left Kidney: Renal measurements: 10.8 x 5.3 x 6.6 cm = volume: 198.8 mL. Moderate hydronephrosis identified on the left. Bladder: Appears normal for degree of bladder distention. Other: The spleen is enlarged measuring 13.8 x 12.7 x 5.8 cm with a volume of 537 cc. IMPRESSION: 1. Moderate left hydronephrosis of uncertain chronicity. Acute/active obstruction not excluded on this study. Recommend clinical correlation. CT imaging could further evaluate for an underlying cause. 2. Splenomegaly. Electronically Signed   By: Dorise Bullion III M.D   On: 05/07/2020 16:55   DG Chest Portable 1 View  Result Date: 05/06/2020 CLINICAL DATA:  Fever, chills and cough. EXAM: PORTABLE CHEST 1 VIEW COMPARISON:  None. FINDINGS: The lungs are hyperinflated. There is no evidence of acute infiltrate, pleural effusion or pneumothorax. The heart size and mediastinal contours are within normal limits. Multiple chronic left-sided rib fractures are seen. A radiopaque fusion plate and screws are seen within the proximal right humerus. IMPRESSION: No active cardiopulmonary disease. Electronically Signed   By: Virgina Norfolk M.D.   On: 05/06/2020 15:41   I have reviewed our office notes, the hospital notes and her labs and imaging and discussed the CT with radiology.    Assessment/Plan: Sepsis with recurrent UTI's.  She is improving on current therapy and doesn't need acute intervention to decompress the kidney.   History of left UPJ obstruction with prior pyeloplasty.   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   We need to get her  records from [place], particular CT's or PET's to see if the dilation is chronic and also do a lasix renogram to determine whether there is true obstruction vs chronic dilation.   A CT with contrast could be considered but is not as reliable at determining obstruction.  Urinary incontinence.   She has had several injections of a bulking agent in 2020 but still has significant incontinence.   There is some slightly high density material at the level of the bladder neck and urethra that is probably the residual material.   This can sometimes erode into the urethra and become a focus for recurrent UTI's.  She will need cystoscopy at some point.   We will need to get her prior Urology records from [place] as well.        No follow-ups  on file.    CC: Dr. Lala Lund and Dr. Lafayette Dragon.      Irine Seal 05/08/2020 [phone]

## 2020-05-08 NOTE — Progress Notes (Signed)
PROGRESS NOTE                                                                                                                                                                                                             Patient Demographics:    Sarah [name], is a 80 y.o. female, DOB - 1940/06/18, FUX:[id number]  Outpatient Primary MD for the patient is Libby Maw, MD    LOS - 1  Admit date - 05/06/2020    Chief Complaint  Patient presents with  . URI       Brief Narrative (HPI from H&P)   Sarah [name] is a 80 y.o. 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   Subjective:   Patient in bed, appears comfortable, denies any headache, no fever, no chest pain or pressure, no shortness of breath , no abdominal pain. No focal weakness.    Assessment  & Plan :    1. Sepsis with UTI - Recurrent UTIs  - 4-5 episode in 2 mths, on Rocephin, follow cultures and fever curve, ultrasound shows left-sided hydronephrosis, discussed the case with urologist Dr.Wrenn who will review the case and get back to [place], also getting CT scan abdomen pelvis.  Continue to monitor sepsis pathophysiology seems to have resolved.   2. AKI and Hypotension - due to sepsis, better with IVF and ABX.  3. CLL - post DC follow with Dr Lorenso Courier.  4. Dyslipidemia - on Zetia.  5.GERD - PPI.   6. Depression - home Rx.  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       Condition - Extremely Guarded  Family Communication  :  Daughter [phone]  On 05/07/20, 05/09/19 8:45 AM full mailbox  Code Status :  DNR  Consults  :  none  PUD Prophylaxis : None   Procedures  :     Renal US - L .  Hydronephrosis  CT -       Disposition Plan  :    Status is: Observation  Dispo: The patient is from: Home  Anticipated d/c is to: Home              Patient currently is not medically stable to d/c.   Difficult to place patient No   DVT Prophylaxis  :  Lovenox    Lab Results  Component Value Date   PLT 195 05/08/2020    Diet :  Diet Order            Diet Heart Room service appropriate? Yes; Fluid consistency: Thin  Diet effective now                  Inpatient Medications  Scheduled Meds: . busPIRone  15 mg Oral BID  . enoxaparin (LOVENOX) injection  30 mg Subcutaneous Q24H  . ezetimibe  10 mg Oral Daily  . feeding supplement  237 mL Oral BID BM  . lidocaine  1 patch Transdermal Q24H  . pantoprazole  40 mg Oral Daily  . potassium chloride  10 mEq Oral Daily  . venlafaxine XR  150 mg Oral Q breakfast   Continuous Infusions: . cefTRIAXone (ROCEPHIN)  IV Stopped (05/07/20 1607)  . lactated ringers 125 mL/hr at 05/08/20 0131   PRN Meds:.acetaminophen **OR** acetaminophen, loperamide  Antibiotics  :    Anti-infectives (From admission, onward)   Start     Dose/Rate Route Frequency Ordered Stop   05/07/20 1600  cefTRIAXone (ROCEPHIN) 1 g in sodium chloride 0.9 % 100 mL IVPB  Status:  Discontinued        1 g 200 mL/hr over 30 Minutes Intravenous Every 24 hours 05/06/20 2030 05/07/20 0736   05/07/20 0800  cefTRIAXone (ROCEPHIN) 2 g in sodium chloride 0.9 % 100 mL IVPB        2 g 200 mL/hr over 30 Minutes Intravenous Every 24 hours 05/07/20 0736     05/06/20 1645  cefTRIAXone (ROCEPHIN) 1 g in sodium chloride 0.9 % 100 mL IVPB        1 g 200 mL/hr over 30 Minutes Intravenous  Once 05/06/20 1636 05/06/20 1753       Time Spent in minutes  30   Lala Lund M.D on 05/08/2020 at 8:44 AM  To page go to [email or web address]   [facility] -  Office  [phone]    See all Orders from today for further details    Objective:   Vitals:    05/07/20 1500 05/07/20 2000 05/08/20 0037 05/08/20 0352  BP: 130/72 (!) 105/59 (!) 160/85 (!) 143/63  Pulse: 90 90 93 89  Resp: (!) 22 (!) 26 20 19   Temp: 100.3 F (37.9 C) 99 F (37.2 C) 99.6 F (37.6 C) 99.6 F (37.6 C)  TempSrc: Oral Oral Oral Oral  SpO2: 100% 100% 96% 96%  Weight:    76.2 kg  Height:        Wt Readings from Last 3 Encounters:  05/08/20 76.2 kg  04/26/20 73.3 kg  02/10/20 73.8 kg     Intake/Output Summary (Last 24 hours) at 05/08/2020 0844 Last data filed at 05/08/2020 5462 Gross per 24 hour  Intake 3517.94 ml  Output 1150 ml  Net 2367.94 ml     Physical Exam  Awake Alert, No new F.N deficits, Normal affect [place].AT,PERRAL Supple Neck,No JVD, No cervical lymphadenopathy appriciated.  Symmetrical Chest wall movement, Good air movement bilaterally, CTAB RRR,No Gallops, Rubs or new Murmurs, No Parasternal Heave +ve B.Sounds, Abd Soft, No tenderness, No organomegaly appriciated, No rebound - guarding or rigidity. No Cyanosis, Clubbing or edema, No  new Rash or bruise      Data Review:    CBC Recent Labs  Lab 05/06/20 1518 05/07/20 0139 05/08/20 0221  WBC 41.0* 29.6* 26.8*  HGB 10.0* 8.6* 8.8*  HCT 32.6* 26.9* 26.6*  PLT 214 192 195  MCV 104.8* 102.7* 100.4*  MCH 32.2 32.8 33.2  MCHC 30.7 32.0 33.1  RDW 15.5 15.2 15.1  LYMPHSABS 36.4*  --  23.5*  MONOABS 0.5  --  0.4  EOSABS 0.3  --  0.3  BASOSABS 0.0  --  0.1    Recent Labs  Lab 05/06/20 1518 05/06/20 2241 05/07/20 0139 05/07/20 0223 05/08/20 0221  NA 137  --  135  --  139  K 4.6  --  4.3  --  4.4  CL 107  --  107  --  108  CO2 20*  --  21*  --  23  GLUCOSE 125*  --  129*  --  113*  BUN 31*  --  28*  --  17  CREATININE 1.41*  --  1.29*  --  1.13*  CALCIUM 8.4*  --  8.3*  --  8.7*  AST 25  --   --   --  16  ALT 24  --   --   --  18  ALKPHOS 171*  --   --   --  127*  BILITOT 0.4  --   --   --  0.5  ALBUMIN 3.7  --   --   --  2.7*  MG  --   --   --  1.9 1.7  PROCALCITON  --    --   --  0.29 0.22  LATICACIDVEN 1.1 1.1  --   --   --   BNP  --   --   --  193.1* 252.0*    ------------------------------------------------------------------------------------------------------------------ No results for input(s): CHOL, HDL, LDLCALC, TRIG, CHOLHDL, LDLDIRECT in the last 72 hours.  No results found for: HGBA1C ------------------------------------------------------------------------------------------------------------------ No results for input(s): TSH, T4TOTAL, T3FREE, THYROIDAB in the last 72 hours.  Invalid input(s): FREET3  Cardiac Enzymes No results for input(s): CKMB, TROPONINI, MYOGLOBIN in the last 168 hours.  Invalid input(s): CK ------------------------------------------------------------------------------------------------------------------    Component Value Date/Time   BNP 252.0 (H) 05/08/2020 0221    Micro Results Recent Results (from the past 240 hour(s))  Covid-19, Flu A+B ([facility])     Status: None   Collection Time: 05/06/20  2:11 PM   Specimen: Nasopharyngeal   Naso  Result Value Ref Range Status   SARS-CoV-2, NAA Not Detected Not Detected Final    Comment: This nucleic acid amplification test was developed and its performance characteristics determined by [employer]. Nucleic acid amplification tests include RT-PCR and TMA. This test has not been FDA cleared or approved. This test has been authorized by FDA under an Emergency Use Authorization (EUA). This test is only authorized for the duration of time the declaration that circumstances exist justifying the authorization of the emergency use of in vitro diagnostic tests for detection of SARS-CoV-2 virus and/or diagnosis of COVID-19 infection under section 564(b)(1) of the Act, 21 U.S.C. 378HYI-5(O) (1), unless the authorization is terminated or revoked sooner. When diagnostic testing is negative, the possibility of a false negative result should be considered in the context  of a patient's recent exposures and the presence of clinical signs and symptoms consistent with COVID-19. An individual without symptoms of COVID-19 and who is not shedding SARS-CoV-2 virus wo uld expect  to have a negative (not detected) result in this assay.    Influenza A, NAA Not Detected Not Detected Final   Influenza B, NAA Not Detected Not Detected Final  Resp Panel by RT-PCR (Flu A&B, Covid) Nasopharyngeal Swab     Status: None   Collection Time: 05/06/20  3:18 PM   Specimen: Nasopharyngeal Swab; Nasopharyngeal(NP) swabs in vial transport medium  Result Value Ref Range Status   SARS Coronavirus 2 by RT PCR NEGATIVE NEGATIVE Final    Comment: (NOTE) SARS-CoV-2 target nucleic acids are NOT DETECTED.  The SARS-CoV-2 RNA is generally detectable in upper respiratory specimens during the acute phase of infection. The lowest concentration of SARS-CoV-2 viral copies this assay can detect is 138 copies/mL. A negative result does not preclude SARS-Cov-2 infection and should not be used as the sole basis for treatment or other patient management decisions. A negative result may occur with  improper specimen collection/handling, submission of specimen other than nasopharyngeal swab, presence of viral mutation(s) within the areas targeted by this assay, and inadequate number of viral copies(<138 copies/mL). A negative result must be combined with clinical observations, patient history, and epidemiological information. The expected result is Negative.  Fact Sheet for Patients:  EntrepreneurPulse.com.au  Fact Sheet for Healthcare Providers:  IncredibleEmployment.be  This test is no t yet approved or cleared by the [place] FDA and  has been authorized for detection and/or diagnosis of SARS-CoV-2 by FDA under an Emergency Use Authorization (EUA). This EUA will remain  in effect (meaning this test can be used) for the duration of the COVID-19  declaration under Section 564(b)(1) of the Act, 21 U.S.C.section 360bbb-3(b)(1), unless the authorization is terminated  or revoked sooner.       Influenza A by PCR NEGATIVE NEGATIVE Final   Influenza B by PCR NEGATIVE NEGATIVE Final    Comment: (NOTE) The Xpert Xpress SARS-CoV-2/FLU/RSV plus assay is intended as an aid in the diagnosis of influenza from Nasopharyngeal swab specimens and should not be used as a sole basis for treatment. Nasal washings and aspirates are unacceptable for Xpert Xpress SARS-CoV-2/FLU/RSV testing.  Fact Sheet for Patients: EntrepreneurPulse.com.au  Fact Sheet for Healthcare Providers: IncredibleEmployment.be  This test is not yet approved or cleared by the [place] FDA and has been authorized for detection and/or diagnosis of SARS-CoV-2 by FDA under an Emergency Use Authorization (EUA). This EUA will remain in effect (meaning this test can be used) for the duration of the COVID-19 declaration under Section 564(b)(1) of the Act, 21 U.S.C. section 360bbb-3(b)(1), unless the authorization is terminated or revoked.  Performed at [facility]     Radiology Reports US RENAL  Result Date: 05/07/2020 CLINICAL DATA:  Pyelonephritis. EXAM: RENAL / URINARY TRACT ULTRASOUND COMPLETE COMPARISON:  None. FINDINGS: Right Kidney: Renal measurements: 11.4 x 5.1 x 5.1 cm = volume: 154.6 mL. Echogenicity within normal limits. No mass or hydronephrosis visualized. Left Kidney: Renal measurements: 10.8 x 5.3 x 6.6 cm = volume: 198.8 mL. Moderate hydronephrosis identified on the left. Bladder: Appears normal for degree of bladder distention. Other: The spleen is enlarged measuring 13.8 x 12.7 x 5.8 cm with a volume of 537 cc. IMPRESSION: 1. Moderate left hydronephrosis of uncertain chronicity. Acute/active obstruction not excluded on this study. Recommend clinical correlation. CT imaging could further evaluate for an  underlying cause. 2. Splenomegaly. Electronically Signed   By: Dorise Bullion III M.D   On: 05/07/2020 16:55   DG Chest Portable 1 View  Result Date:  05/06/2020 CLINICAL DATA:  Fever, chills and cough. EXAM: PORTABLE CHEST 1 VIEW COMPARISON:  None. FINDINGS: The lungs are hyperinflated. There is no evidence of acute infiltrate, pleural effusion or pneumothorax. The heart size and mediastinal contours are within normal limits. Multiple chronic left-sided rib fractures are seen. A radiopaque fusion plate and screws are seen within the proximal right humerus. IMPRESSION: No active cardiopulmonary disease. Electronically Signed   By: Virgina Norfolk M.D.   On: 05/06/2020 15:41

## 2020-05-09 LAB — COMPREHENSIVE METABOLIC PANEL
ALT: 16 U/L (ref 0–44)
AST: 14 U/L — ABNORMAL LOW (ref 15–41)
Albumin: 2.5 g/dL — ABNORMAL LOW (ref 3.5–5.0)
Alkaline Phosphatase: 106 U/L (ref 38–126)
Anion gap: 8 (ref 5–15)
BUN: 15 mg/dL (ref 8–23)
CO2: 24 mmol/L (ref 22–32)
Calcium: 8.5 mg/dL — ABNORMAL LOW (ref 8.9–10.3)
Chloride: 106 mmol/L (ref 98–111)
Creatinine, Ser: 1.11 mg/dL — ABNORMAL HIGH (ref 0.44–1.00)
GFR, Estimated: 50 mL/min — ABNORMAL LOW (ref >60)
Glucose, Bld: 108 mg/dL — ABNORMAL HIGH (ref 70–99)
Potassium: 4 mmol/L (ref 3.5–5.1)
Sodium: 138 mmol/L (ref 135–145)
Total Bilirubin: 0.4 mg/dL (ref 0.3–1.2)
Total Protein: 4.7 g/dL — ABNORMAL LOW (ref 6.5–8.1)

## 2020-05-09 LAB — CBC WITH DIFFERENTIAL/PLATELET
Abs Immature Granulocytes: 0 10*3/uL (ref 0.00–0.07)
Basophils Absolute: 0.3 10*3/uL — ABNORMAL HIGH (ref 0.0–0.1)
Basophils Relative: 1 %
Eosinophils Absolute: 1.1 10*3/uL — ABNORMAL HIGH (ref 0.0–0.5)
Eosinophils Relative: 4 %
HCT: 25.7 % — ABNORMAL LOW (ref 36.0–46.0)
Hemoglobin: 8.4 g/dL — ABNORMAL LOW (ref 12.0–15.0)
Lymphocytes Relative: 70 %
Lymphs Abs: 18.6 10*3/uL — ABNORMAL HIGH (ref 0.7–4.0)
MCH: 33.2 pg (ref 26.0–34.0)
MCHC: 32.7 g/dL (ref 30.0–36.0)
MCV: 101.6 fL — ABNORMAL HIGH (ref 80.0–100.0)
Monocytes Absolute: 0.3 10*3/uL (ref 0.1–1.0)
Monocytes Relative: 1 %
Neutro Abs: 6.4 10*3/uL (ref 1.7–7.7)
Neutrophils Relative %: 24 %
Platelets: 191 10*3/uL (ref 150–400)
RBC: 2.53 MIL/uL — ABNORMAL LOW (ref 3.87–5.11)
RDW: 15 % (ref 11.5–15.5)
WBC: 26.6 10*3/uL — ABNORMAL HIGH (ref 4.0–10.5)
nRBC: 0 % (ref 0.0–0.2)
nRBC: 0 /100 WBC

## 2020-05-09 LAB — MAGNESIUM: Magnesium: 1.7 mg/dL (ref 1.7–2.4)

## 2020-05-09 LAB — PROCALCITONIN: Procalcitonin: 0.14 ng/mL

## 2020-05-09 LAB — BRAIN NATRIURETIC PEPTIDE: B Natriuretic Peptide: 135.8 pg/mL — ABNORMAL HIGH (ref 0.0–100.0)

## 2020-05-09 NOTE — Progress Notes (Signed)
Antibiotic ran late due to access issues.

## 2020-05-09 NOTE — Progress Notes (Signed)
Subjective: She is doing better on the antibiotics.  Her fever has resolved.  Cr has declined further to 1.11.  Her leukocytosis has stabilized in her prior range.   Urine culture is pending. She has persistent incontinence.  I have discussed her case with radiology and will try to have images from [place] obtained.     ROS:  Review of Systems  All other systems reviewed and are negative.   Anti-infectives: Anti-infectives (From admission, onward)   Start     Dose/Rate Route Frequency Ordered Stop   05/07/20 1600  cefTRIAXone (ROCEPHIN) 1 g in sodium chloride 0.9 % 100 mL IVPB  Status:  Discontinued        1 g 200 mL/hr over 30 Minutes Intravenous Every 24 hours 05/06/20 2030 05/07/20 0736   05/07/20 0800  cefTRIAXone (ROCEPHIN) 2 g in sodium chloride 0.9 % 100 mL IVPB        2 g 200 mL/hr over 30 Minutes Intravenous Every 24 hours 05/07/20 0736     05/06/20 1645  cefTRIAXone (ROCEPHIN) 1 g in sodium chloride 0.9 % 100 mL IVPB        1 g 200 mL/hr over 30 Minutes Intravenous  Once 05/06/20 1636 05/06/20 1753      Current Facility-Administered Medications  Medication Dose Route Frequency Provider Last Rate Last Admin  . acetaminophen (TYLENOL) tablet 650 mg  650 mg Oral Q6H PRN Sarah Leff, MD   650 mg at 05/08/20 2158   Or  . acetaminophen (TYLENOL) suppository 650 mg  650 mg Rectal Q6H PRN Sarah Leff, MD      . busPIRone (BUSPAR) tablet 15 mg  15 mg Oral BID Sarah Lose, MD   15 mg at 05/08/20 2158  . cefTRIAXone (ROCEPHIN) 2 g in sodium chloride 0.9 % 100 mL IVPB  2 g Intravenous Q24H Sarah Lose, MD 200 mL/hr at 05/08/20 0909 2 g at 05/08/20 0909  . enoxaparin (LOVENOX) injection 30 mg  30 mg Subcutaneous Q24H Sarah Leff, MD   30 mg at 05/08/20 2158  . ezetimibe (ZETIA) tablet 10 mg  10 mg Oral Daily Sarah Lose, MD   10 mg at 05/08/20 0909  . feeding supplement (ENSURE ENLIVE / ENSURE PLUS) liquid 237 mL  237 mL Oral BID BM Sarah Griffins, MD   237 mL at 05/08/20 0914  . lactated ringers infusion   Intravenous Continuous Sarah Lose, MD 50 mL/hr at 05/08/20 2155 Restarted at 05/08/20 2155  . lidocaine (LIDODERM) 5 % 1 patch  1 patch Transdermal Q24H Sarah [name], Sarah B, DO   1 patch at 05/08/20 2158  . loperamide (IMODIUM) capsule 2 mg  2 mg Oral Q6H PRN Sarah Lose, MD      . pantoprazole (PROTONIX) EC tablet 40 mg  40 mg Oral Daily Sarah Lose, MD   40 mg at 05/08/20 0909  . potassium chloride (KLOR-CON) CR tablet 10 mEq  10 mEq Oral Daily Sarah Lose, MD   10 mEq at 05/08/20 0909  . venlafaxine XR (EFFEXOR-XR) 24 hr capsule 150 mg  150 mg Oral Q breakfast Sarah Lose, MD   150 mg at 05/08/20 0909     Objective: Vital signs in last 24 hours: Temp:  [98.1 F (36.7 C)-99 F (37.2 C)] 98.9 F (37.2 C) (03/20 0550) Pulse Rate:  [83-92] 83 (03/20 0550) Resp:  [18-20] 18 (03/19 1954) BP: (126-149)/(78-86) 137/78 (03/20 0550) SpO2:  [93 %-98 %] 93 % (  03/20 0550)  Intake/Output from previous day: 03/19 0701 - 03/20 0700 In: 1451.7 [P.O.:360; I.V.:991.7; IV Piggyback:100] Out: 650 [Urine:650] Intake/Output this shift: Total I/O In: -  Out: 700 [Urine:700]   Physical Exam Vitals reviewed.  Constitutional:      Appearance: Normal appearance.  Abdominal:     General: Abdomen is flat.     Palpations: Abdomen is soft.     Tenderness: There is abdominal tenderness (mild diffuse). There is no right CVA tenderness or left CVA tenderness.  Neurological:     Mental Status: She is alert.     Lab Results:  Recent Labs    05/08/20 0221 05/09/20 0244  WBC 26.8* 26.6*  HGB 8.8* 8.4*  HCT 26.6* 25.7*  PLT 195 191   BMET Recent Labs    05/08/20 0221 05/09/20 0244  NA 139 138  K 4.4 4.0  CL 108 106  CO2 23 24  GLUCOSE 113* 108*  BUN 17 15  CREATININE 1.13* 1.11*  CALCIUM 8.7* 8.5*   PT/INR No results for input(s): LABPROT, INR in the last 72 hours. ABG No results  for input(s): PHART, HCO3 in the last 72 hours.  Invalid input(s): PCO2, PO2  Studies/Results: CT ABDOMEN PELVIS WO CONTRAST  Result Date: 05/08/2020 CLINICAL DATA:  80 year old Sarah [name] with pyelonephritis. EXAM: CT ABDOMEN AND PELVIS WITHOUT CONTRAST TECHNIQUE: Multidetector CT imaging of the abdomen and pelvis was performed following the standard protocol without IV contrast. COMPARISON:  Renal ultrasound yesterday. FINDINGS: [name] chest: Mild cardiomegaly. Tortuous descending thoracic aorta. No pericardial effusion. Minimal lung base atelectasis. Hepatobiliary: Mildly nodular liver contour. Circumscribed round low-density area measuring 12 mm on series 3, image 21 has simple fluid density and is likely a benign cyst. No other discrete liver lesion on this noncontrast exam. Diminutive or absent gallbladder. Pancreas: Negative noncontrast pancreas. Spleen: Splenomegaly. Estimated splenic volume 535 mL (normal splenic volume range 83 - 412 mL). No discrete splenic lesion. Adrenals/Urinary Tract: Negative adrenal glands. Right kidney appears nonobstructed although there is a round 9 mm calculus in the right renal pelvis (series 6, image 82). Right ureter is decompressed. The distal ureter is difficult to delineate but appears to remain decompressed to the to the bladder. There is moderate to severe left hydronephrosis. But the left ureteropelvic junction appears abruptly tapered on coronal image 79. Only a punctate left intrarenal calculus is identified. No left hydroureter is evident. The left UVJ appears normal. Diminutive urinary bladder, with mild bladder wall thickening in the region of sigmoid colon inflammatory stranding. See below. Numerous pelvic phleboliths. Stomach/Bowel: Retained stool in the rectum with no rectal wall thickening or inflammation identified. However, the upstream sigmoid colon is thickened and with mild mesenteric stranding as seen on coronal image 68 which extends to the nearby  bladder dome. There are diverticula in the region. This inflammation is in a segment of fiber 6 cm. Diverticulosis in the upstream descending colon without active inflammation. Mild retained stool proximal to the sigmoid. Diminutive or absent appendix. Terminal ileum also remarkable for fairly extensive diverticulosis which tracks into the upstream distal small bowel. No dilated small bowel. No free air or free fluid. No other convincing acute bowel inflammation. Stomach and duodenum are decompressed. Vascular/Lymphatic: Aortoiliac calcified atherosclerosis. Tortuous abdominal aorta. Vascular patency is not evaluated in the absence of IV contrast. Additionally, there is nonspecific appearing lymphadenopathy in the small bowel mesentery (coronal images 54-59) with individual lymph nodes up to 13 mm short axis. Associated haziness of the root of the  mesentery. Furthermore, there is bulky left side retroperitoneal lymphadenopathy, with individual pararenal nodes up to 23 mm short axis. The lymphadenopathy abates at the pelvic inlet. No inguinal lymphadenopathy. Reproductive: Absent uterus.  Diminutive or absent ovaries. Other: Mild presacral stranding is nonspecific. No pelvic free fluid. Musculoskeletal: Several chronic left posterior rib fractures. Grade 1 degenerative [name] lumbar spondylolisthesis with severe facet degeneration. Severe chronic disc degeneration at the lumbosacral junction. No acute osseous abnormality identified. IMPRESSION: 1. Moderate to severe retroperitoneal and mesenteric lymphadenopathy suspicious for Lymphoma, Leukemia, or less likely other lymphoproliferative disorder. A CT guided left pararenal lymph node needle biopsy should be feasible. 2. Sigmoid colon appears inflamed compatible with acute diverticulitis or colitis. Mild secondary inflammation of the nearby Bladder Dome. No evidence of colovesical fistula or abscess. Underlying extensive small bowel diverticulosis. But no other bowel  inflammation identified on this noncontrast exam. 3. Moderate to severe left hydronephrosis but with abrupt tapering at the left ureteropelvic junction suggesting UPJ stenosis. Right greater than left nephrolithiasis but no obstructing calculus is identified. No strong evidence of pyelonephritis, although IV contrast would be necessary for that CT diagnosis. 4. Splenomegaly, which may be secondary to #1 rather than due to chronic liver disease. 5. Aortic Atherosclerosis (ICD10-I70.0). Electronically Signed   By: Genevie Ann M.D.   On: 05/08/2020 09:16   US RENAL  Result Date: 05/07/2020 CLINICAL DATA:  Pyelonephritis. EXAM: RENAL / URINARY TRACT ULTRASOUND COMPLETE COMPARISON:  None. FINDINGS: Right Kidney: Renal measurements: 11.4 x 5.1 x 5.1 cm = volume: 154.6 mL. Echogenicity within normal limits. No mass or hydronephrosis visualized. Left Kidney: Renal measurements: 10.8 x 5.3 x 6.6 cm = volume: 198.8 mL. Moderate hydronephrosis identified on the left. Bladder: Appears normal for degree of bladder distention. Other: The spleen is enlarged measuring 13.8 x 12.7 x 5.8 cm with a volume of 537 cc. IMPRESSION: 1. Moderate left hydronephrosis of uncertain chronicity. Acute/active obstruction not excluded on this study. Recommend clinical correlation. CT imaging could further evaluate for an underlying cause. 2. Splenomegaly. Electronically Signed   By: Dorise Bullion III M.D   On: 05/07/2020 16:55     Assessment and Plan: Left hydronephrosis with prior pyeloplasty.  I will order a lasix renogram to determine if this is obstruction or non-obstructive dilation.  She has no flank pain and normal renal function.    Sepsis with UTI and a history of recurrent UTI's.  Urine culture pending.   Continue current therapy.  She is improving.  She will need suppressive therapy after completion of the therapeutic course.   Urinary incontinence with prior bulking agent injections.   She will need cystoscopy to assess for  erosion.     Effort to get outside records from [facility] in [place] is ongoing.     LOS: 2 days    Sarah [name] ID: Sarah [name], Sarah [name]   DOB: 1940-12-12, 80 y.o.   MRN: [id number]

## 2020-05-09 NOTE — Progress Notes (Signed)
PROGRESS NOTE                                                                                                                                                                                                             Patient Demographics:    Sarah [name], is a 80 y.o. female, DOB - 1940/05/24, IPJ:[id number]  Outpatient Primary MD for the patient is Libby Maw, MD    LOS - 2  Admit date - 05/06/2020    Chief Complaint  Patient presents with  . URI       Brief Narrative (HPI from H&P)   Sarah [name] is a 80 y.o. 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   Subjective:   Patient in bed, appears comfortable, denies any headache, no fever, no chest pain or pressure, no shortness of breath , no abdominal pain. No focal weakness.    Assessment  & Plan :    1. 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   2. AKI and Hypotension - due to sepsis, better with IVF and ABX.  3. CLL - post DC follow with Dr Lorenso Courier.  4. Dyslipidemia - on Zetia.  5.GERD - PPI.   6. Depression - home Rx.  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       Condition - Extremely Guarded  Family Communication  :  Daughter [phone]  On 05/07/20, 05/09/19 8:45 AM full mailbox  Code Status :  DNR  Consults  :  none  PUD Prophylaxis : None   Procedures  :      Renal US - L . Hydronephrosis  CT - 1. Moderate to severe retroperitoneal and mesenteric lymphadenopathy suspicious for Lymphoma, Leukemia, or less likely other lymphoproliferative disorder. A CT guided left pararenal lymph node needle biopsy should be feasible. 2. Sigmoid colon appears inflamed compatible with  acute diverticulitis or colitis. Mild secondary inflammation of the nearby Bladder Dome. No evidence of colovesical fistula or abscess. Underlying extensive small bowel diverticulosis. But no other bowel inflammation identified on this noncontrast exam. 3. Moderate to severe left hydronephrosis but with abrupt tapering at the left ureteropelvic junction suggesting UPJ stenosis. Right greater than left nephrolithiasis but no obstructing calculus is identified. No strong evidence of pyelonephritis, although IV contrast would be necessary for that CT diagnosis. 4. Splenomegaly, which may be secondary to #1 rather than due to chronic liver disease. 5. Aortic Atherosclerosis      Disposition Plan  :    Status is: Observation  Dispo: The patient is from: Home              Anticipated d/c is to: Home              Patient currently is not medically stable to d/c.   Difficult to place patient No   DVT Prophylaxis  :  Lovenox    Lab Results  Component Value Date   PLT 191 05/09/2020    Diet :  Diet Order            Diet Heart Room service appropriate? Yes; Fluid consistency: Thin  Diet effective now                  Inpatient Medications  Scheduled Meds: . busPIRone  15 mg Oral BID  . enoxaparin (LOVENOX) injection  30 mg Subcutaneous Q24H  . ezetimibe  10 mg Oral Daily  . feeding supplement  237 mL Oral BID BM  . lidocaine  1 patch Transdermal Q24H  . pantoprazole  40 mg Oral Daily  . potassium chloride  10 mEq Oral Daily  . venlafaxine XR  150 mg Oral Q breakfast   Continuous Infusions: . cefTRIAXone (ROCEPHIN)  IV 2 g (05/08/20 0909)  . lactated ringers 50 mL/hr at  05/08/20 2155   PRN Meds:.acetaminophen **OR** acetaminophen, loperamide  Antibiotics  :    Anti-infectives (From admission, onward)   Start     Dose/Rate Route Frequency Ordered Stop   05/07/20 1600  cefTRIAXone (ROCEPHIN) 1 g in sodium chloride 0.9 % 100 mL IVPB  Status:  Discontinued        1 g 200 mL/hr over 30 Minutes Intravenous Every 24 hours 05/06/20 2030 05/07/20 0736   05/07/20 0800  cefTRIAXone (ROCEPHIN) 2 g in sodium chloride 0.9 % 100 mL IVPB        2 g 200 mL/hr over 30 Minutes Intravenous Every 24 hours 05/07/20 0736     05/06/20 1645  cefTRIAXone (ROCEPHIN) 1 g in sodium chloride 0.9 % 100 mL IVPB        1 g 200 mL/hr over 30 Minutes Intravenous  Once 05/06/20 1636 05/06/20 1753       Time Spent in minutes  30   Lala Lund M.D on 05/09/2020 at 9:01 AM  To page go to [email or web address]   [facility] -  Office  [phone]    See all Orders from today for further details    Objective:   Vitals:   05/08/20 0352 05/08/20 1500 05/08/20 1954 05/09/20 0550  BP: (!) 143/63 (!) 149/86 126/79 137/78  Pulse: 89  92 83  Resp: 19 20 18    Temp: 99.6 F (37.6 C) 99 F (37.2 C) 98.1 F (36.7 C) 98.9 F (37.2 C)  TempSrc: Oral Oral Oral Oral  SpO2: 96% 98% 96% 93%  Weight:  76.2 kg     Height:        Wt Readings from Last 3 Encounters:  05/08/20 76.2 kg  04/26/20 73.3 kg  02/10/20 73.8 kg     Intake/Output Summary (Last 24 hours) at 05/09/2020 0901 Last data filed at 05/09/2020 0500 Gross per 24 hour  Intake 1451.67 ml  Output 650 ml  Net 801.67 ml     Physical Exam  Awake Alert, No new F.N deficits, Normal affect [place].AT,PERRAL Supple Neck,No JVD, No cervical lymphadenopathy appriciated.  Symmetrical Chest wall movement, Good air movement bilaterally, CTAB RRR,No Gallops, Rubs or new Murmurs, No Parasternal Heave +ve B.Sounds, Abd Soft, No tenderness, No organomegaly appriciated, No rebound - guarding or rigidity. No Cyanosis, Clubbing or  edema, No new Rash or bruise    Data Review:    CBC Recent Labs  Lab 05/06/20 1518 05/07/20 0139 05/08/20 0221 05/09/20 0244  WBC 41.0* 29.6* 26.8* 26.6*  HGB 10.0* 8.6* 8.8* 8.4*  HCT 32.6* 26.9* 26.6* 25.7*  PLT 214 192 195 191  MCV 104.8* 102.7* 100.4* 101.6*  MCH 32.2 32.8 33.2 33.2  MCHC 30.7 32.0 33.1 32.7  RDW 15.5 15.2 15.1 15.0  LYMPHSABS 36.4*  --  23.5* 18.6*  MONOABS 0.5  --  0.4 0.3  EOSABS 0.3  --  0.3 1.1*  BASOSABS 0.0  --  0.1 0.3*    Recent Labs  Lab 05/06/20 1518 05/06/20 2241 05/07/20 0139 05/07/20 0223 05/08/20 0221 05/09/20 0244  NA 137  --  135  --  139 138  K 4.6  --  4.3  --  4.4 4.0  CL 107  --  107  --  108 106  CO2 20*  --  21*  --  23 24  GLUCOSE 125*  --  129*  --  113* 108*  BUN 31*  --  28*  --  17 15  CREATININE 1.41*  --  1.29*  --  1.13* 1.11*  CALCIUM 8.4*  --  8.3*  --  8.7* 8.5*  AST 25  --   --   --  16 14*  ALT 24  --   --   --  18 16  ALKPHOS 171*  --   --   --  127* 106  BILITOT 0.4  --   --   --  0.5 0.4  ALBUMIN 3.7  --   --   --  2.7* 2.5*  MG  --   --   --  1.9 1.7 1.7  PROCALCITON  --   --   --  0.29 0.22 0.14  LATICACIDVEN 1.1 1.1  --   --   --   --   BNP  --   --   --  193.1* 252.0* 135.8*    ------------------------------------------------------------------------------------------------------------------ No results for input(s): CHOL, HDL, LDLCALC, TRIG, CHOLHDL, LDLDIRECT in the last 72 hours.  No results found for: HGBA1C ------------------------------------------------------------------------------------------------------------------ No results for input(s): TSH, T4TOTAL, T3FREE, THYROIDAB in the last 72 hours.  Invalid input(s): FREET3  Cardiac Enzymes No results for input(s): CKMB, TROPONINI, MYOGLOBIN in the last 168 hours.  Invalid input(s): CK ------------------------------------------------------------------------------------------------------------------    Component Value Date/Time   BNP  135.8 (H) 05/09/2020 0244    Micro Results Recent Results (from the past 240 hour(s))  Covid-19, Flu A+B ([facility])     Status: None   Collection Time: 05/06/20  2:11 PM   Specimen: Nasopharyngeal   Naso  Result Value Ref Range Status  SARS-CoV-2, NAA Not Detected Not Detected Final    Comment: This nucleic acid amplification test was developed and its performance characteristics determined by [employer]. Nucleic acid amplification tests include RT-PCR and TMA. This test has not been FDA cleared or approved. This test has been authorized by FDA under an Emergency Use Authorization (EUA). This test is only authorized for the duration of time the declaration that circumstances exist justifying the authorization of the emergency use of in vitro diagnostic tests for detection of SARS-CoV-2 virus and/or diagnosis of COVID-19 infection under section 564(b)(1) of the Act, 21 U.S.C. 254YHC-6(C) (1), unless the authorization is terminated or revoked sooner. When diagnostic testing is negative, the possibility of a false negative result should be considered in the context of a patient's recent exposures and the presence of clinical signs and symptoms consistent with COVID-19. An individual without symptoms of COVID-19 and who is not shedding SARS-CoV-2 virus wo uld expect to have a negative (not detected) result in this assay.    Influenza A, NAA Not Detected Not Detected Final   Influenza B, NAA Not Detected Not Detected Final  Resp Panel by RT-PCR (Flu A&B, Covid) Nasopharyngeal Swab     Status: None   Collection Time: 05/06/20  3:18 PM   Specimen: Nasopharyngeal Swab; Nasopharyngeal(NP) swabs in vial transport medium  Result Value Ref Range Status   SARS Coronavirus 2 by RT PCR NEGATIVE NEGATIVE Final    Comment: (NOTE) SARS-CoV-2 target nucleic acids are NOT DETECTED.  The SARS-CoV-2 RNA is generally detectable in upper respiratory specimens during the acute phase of  infection. The lowest concentration of SARS-CoV-2 viral copies this assay can detect is 138 copies/mL. A negative result does not preclude SARS-Cov-2 infection and should not be used as the sole basis for treatment or other patient management decisions. A negative result may occur with  improper specimen collection/handling, submission of specimen other than nasopharyngeal swab, presence of viral mutation(s) within the areas targeted by this assay, and inadequate number of viral copies(<138 copies/mL). A negative result must be combined with clinical observations, patient history, and epidemiological information. The expected result is Negative.  Fact Sheet for Patients:  EntrepreneurPulse.com.au  Fact Sheet for Healthcare Providers:  IncredibleEmployment.be  This test is no t yet approved or cleared by the [place] FDA and  has been authorized for detection and/or diagnosis of SARS-CoV-2 by FDA under an Emergency Use Authorization (EUA). This EUA will remain  in effect (meaning this test can be used) for the duration of the COVID-19 declaration under Section 564(b)(1) of the Act, 21 U.S.C.section 360bbb-3(b)(1), unless the authorization is terminated  or revoked sooner.       Influenza A by PCR NEGATIVE NEGATIVE Final   Influenza B by PCR NEGATIVE NEGATIVE Final    Comment: (NOTE) The Xpert Xpress SARS-CoV-2/FLU/RSV plus assay is intended as an aid in the diagnosis of influenza from Nasopharyngeal swab specimens and should not be used as a sole basis for treatment. Nasal washings and aspirates are unacceptable for Xpert Xpress SARS-CoV-2/FLU/RSV testing.  Fact Sheet for Patients: EntrepreneurPulse.com.au  Fact Sheet for Healthcare Providers: IncredibleEmployment.be  This test is not yet approved or cleared by the [place] FDA and has been authorized for detection and/or diagnosis of SARS-CoV-2  by FDA under an Emergency Use Authorization (EUA). This EUA will remain in effect (meaning this test can be used) for the duration of the COVID-19 declaration under Section 564(b)(1) of the Act, 21 U.S.C. section 360bbb-3(b)(1), unless the authorization  is terminated or revoked.  Performed at [facility]   Culture, blood (routine x 2)     Status: None (Preliminary result)   Collection Time: 05/06/20  3:20 PM   Specimen: BLOOD  Result Value Ref Range Status   Specimen Description   Final    BLOOD Performed at [facility]    Special Requests   Final    LEFT ANTECUBITAL Blood Culture adequate volume Performed at [facility]    Culture   Final    NO GROWTH 1 DAY Performed at [facility], [place] [address], [place], [place] [address]    Report Status PENDING  Incomplete  Culture, blood (routine x 2)     Status: None (Preliminary result)   Collection Time: 05/06/20  4:50 PM   Specimen: BLOOD  Result Value Ref Range Status   Specimen Description   Final    BLOOD Performed at [facility]    Special Requests   Final    RIGHT ANTECUBITAL Blood Culture adequate volume Performed at [facility]    Culture   Final    NO GROWTH 1 DAY Performed at [facility], [place] [address] [place], [place] [address]    Report Status PENDING  Incomplete    Radiology Reports CT ABDOMEN PELVIS WO CONTRAST  Result Date: 05/08/2020 CLINICAL DATA:  80 year old female with pyelonephritis. EXAM: CT ABDOMEN AND PELVIS WITHOUT CONTRAST TECHNIQUE: Multidetector CT imaging of the abdomen and pelvis was performed following the standard protocol without IV contrast. COMPARISON:  Renal ultrasound yesterday. FINDINGS: Lower chest: Mild cardiomegaly. Tortuous descending thoracic aorta. No pericardial effusion. Minimal lung base atelectasis. Hepatobiliary: Mildly nodular liver contour. Circumscribed round low-density  area measuring 12 mm on series 3, image 21 has simple fluid density and is likely a benign cyst. No other discrete liver lesion on this noncontrast exam. Diminutive or absent gallbladder. Pancreas: Negative noncontrast pancreas. Spleen: Splenomegaly. Estimated splenic volume 535 mL (normal splenic volume range 83 - 412 mL). No discrete splenic lesion. Adrenals/Urinary Tract: Negative adrenal glands. Right kidney appears nonobstructed although there is a round 9 mm calculus in the right renal pelvis (series 6, image 82). Right ureter is decompressed. The distal ureter is difficult to delineate but appears to remain decompressed to the to the bladder. There is moderate to severe left hydronephrosis. But the left ureteropelvic junction appears abruptly tapered on coronal image 79. Only a punctate left intrarenal calculus is identified. No left hydroureter is evident. The left UVJ appears normal. Diminutive urinary bladder, with mild bladder wall thickening in the region of sigmoid colon inflammatory stranding. See below. Numerous pelvic phleboliths. Stomach/Bowel: Retained stool in the rectum with no rectal wall thickening or inflammation identified. However, the upstream sigmoid colon is thickened and with mild mesenteric stranding as seen on coronal image 68 which extends to the nearby bladder dome. There are diverticula in the region. This inflammation is in a segment of fiber 6 cm. Diverticulosis in the upstream descending colon without active inflammation. Mild retained stool proximal to the sigmoid. Diminutive or absent appendix. Terminal ileum also remarkable for fairly extensive diverticulosis which tracks into the upstream distal small bowel. No dilated small bowel. No free air or free fluid. No other convincing acute bowel inflammation. Stomach and duodenum are decompressed. Vascular/Lymphatic: Aortoiliac calcified atherosclerosis. Tortuous abdominal aorta. Vascular patency is not evaluated in the absence of  IV contrast. Additionally, there is nonspecific appearing lymphadenopathy in  the small bowel mesentery (coronal images 54-59) with individual lymph nodes up to 13 mm short axis. Associated haziness of the root of the mesentery. Furthermore, there is bulky left side retroperitoneal lymphadenopathy, with individual pararenal nodes up to 23 mm short axis. The lymphadenopathy abates at the pelvic inlet. No inguinal lymphadenopathy. Reproductive: Absent uterus.  Diminutive or absent ovaries. Other: Mild presacral stranding is nonspecific. No pelvic free fluid. Musculoskeletal: Several chronic left posterior rib fractures. Grade 1 degenerative lower lumbar spondylolisthesis with severe facet degeneration. Severe chronic disc degeneration at the lumbosacral junction. No acute osseous abnormality identified. IMPRESSION: 1. Moderate to severe retroperitoneal and mesenteric lymphadenopathy suspicious for Lymphoma, Leukemia, or less likely other lymphoproliferative disorder. A CT guided left pararenal lymph node needle biopsy should be feasible. 2. Sigmoid colon appears inflamed compatible with acute diverticulitis or colitis. Mild secondary inflammation of the nearby Bladder Dome. No evidence of colovesical fistula or abscess. Underlying extensive small bowel diverticulosis. But no other bowel inflammation identified on this noncontrast exam. 3. Moderate to severe left hydronephrosis but with abrupt tapering at the left ureteropelvic junction suggesting UPJ stenosis. Right greater than left nephrolithiasis but no obstructing calculus is identified. No strong evidence of pyelonephritis, although IV contrast would be necessary for that CT diagnosis. 4. Splenomegaly, which may be secondary to #1 rather than due to chronic liver disease. 5. Aortic Atherosclerosis (ICD10-I70.0). Electronically Signed   By: Genevie Ann M.D.   On: 05/08/2020 09:16   US RENAL  Result Date: 05/07/2020 CLINICAL DATA:  Pyelonephritis. EXAM: RENAL /  URINARY TRACT ULTRASOUND COMPLETE COMPARISON:  None. FINDINGS: Right Kidney: Renal measurements: 11.4 x 5.1 x 5.1 cm = volume: 154.6 mL. Echogenicity within normal limits. No mass or hydronephrosis visualized. Left Kidney: Renal measurements: 10.8 x 5.3 x 6.6 cm = volume: 198.8 mL. Moderate hydronephrosis identified on the left. Bladder: Appears normal for degree of bladder distention. Other: The spleen is enlarged measuring 13.8 x 12.7 x 5.8 cm with a volume of 537 cc. IMPRESSION: 1. Moderate left hydronephrosis of uncertain chronicity. Acute/active obstruction not excluded on this study. Recommend clinical correlation. CT imaging could further evaluate for an underlying cause. 2. Splenomegaly. Electronically Signed   By: Dorise Bullion III M.D   On: 05/07/2020 16:55   DG Chest Portable 1 View  Result Date: 05/06/2020 CLINICAL DATA:  Fever, chills and cough. EXAM: PORTABLE CHEST 1 VIEW COMPARISON:  None. FINDINGS: The lungs are hyperinflated. There is no evidence of acute infiltrate, pleural effusion or pneumothorax. The heart size and mediastinal contours are within normal limits. Multiple chronic left-sided rib fractures are seen. A radiopaque fusion plate and screws are seen within the proximal right humerus. IMPRESSION: No active cardiopulmonary disease. Electronically Signed   By: Virgina Norfolk M.D.   On: 05/06/2020 15:41

## 2020-05-10 ENCOUNTER — Inpatient Hospital Stay (HOSPITAL_COMMUNITY): Payer: Medicare Other

## 2020-05-10 LAB — CBC WITH DIFFERENTIAL/PLATELET
Abs Immature Granulocytes: 0.03 10*3/uL (ref 0.00–0.07)
Basophils Absolute: 0.1 10*3/uL (ref 0.0–0.1)
Basophils Relative: 0 %
Eosinophils Absolute: 0.4 10*3/uL (ref 0.0–0.5)
Eosinophils Relative: 1 %
HCT: 28.8 % — ABNORMAL LOW (ref 36.0–46.0)
Hemoglobin: 9.1 g/dL — ABNORMAL LOW (ref 12.0–15.0)
Immature Granulocytes: 0 %
Lymphocytes Relative: 90 %
Lymphs Abs: 23.1 10*3/uL — ABNORMAL HIGH (ref 0.7–4.0)
MCH: 32.5 pg (ref 26.0–34.0)
MCHC: 31.6 g/dL (ref 30.0–36.0)
MCV: 102.9 fL — ABNORMAL HIGH (ref 80.0–100.0)
Monocytes Absolute: 0.3 10*3/uL (ref 0.1–1.0)
Monocytes Relative: 1 %
Neutro Abs: 2.1 10*3/uL (ref 1.7–7.7)
Neutrophils Relative %: 8 %
Platelets: 198 10*3/uL (ref 150–400)
RBC: 2.8 MIL/uL — ABNORMAL LOW (ref 3.87–5.11)
RDW: 14.8 % (ref 11.5–15.5)
WBC: 26 10*3/uL — ABNORMAL HIGH (ref 4.0–10.5)
nRBC: 0.1 % (ref 0.0–0.2)

## 2020-05-10 LAB — COMPREHENSIVE METABOLIC PANEL
ALT: 14 U/L (ref 0–44)
AST: 17 U/L (ref 15–41)
Albumin: 2.6 g/dL — ABNORMAL LOW (ref 3.5–5.0)
Alkaline Phosphatase: 114 U/L (ref 38–126)
Anion gap: 8 (ref 5–15)
BUN: 10 mg/dL (ref 8–23)
CO2: 25 mmol/L (ref 22–32)
Calcium: 8.8 mg/dL — ABNORMAL LOW (ref 8.9–10.3)
Chloride: 106 mmol/L (ref 98–111)
Creatinine, Ser: 1.09 mg/dL — ABNORMAL HIGH (ref 0.44–1.00)
GFR, Estimated: 51 mL/min — ABNORMAL LOW (ref >60)
Glucose, Bld: 102 mg/dL — ABNORMAL HIGH (ref 70–99)
Potassium: 3.7 mmol/L (ref 3.5–5.1)
Sodium: 139 mmol/L (ref 135–145)
Total Bilirubin: 0.4 mg/dL (ref 0.3–1.2)
Total Protein: 4.9 g/dL — ABNORMAL LOW (ref 6.5–8.1)

## 2020-05-10 LAB — MAGNESIUM: Magnesium: 1.7 mg/dL (ref 1.7–2.4)

## 2020-05-10 LAB — BRAIN NATRIURETIC PEPTIDE: B Natriuretic Peptide: 257.5 pg/mL — ABNORMAL HIGH (ref 0.0–100.0)

## 2020-05-10 LAB — PATHOLOGIST SMEAR REVIEW

## 2020-05-10 MED ORDER — FUROSEMIDE 10 MG/ML IJ SOLN
38.1000 mg | Freq: Once | INTRAMUSCULAR | Status: AC
  Administered 2020-05-10: 38 mg via INTRAVENOUS

## 2020-05-10 MED ORDER — FUROSEMIDE 10 MG/ML IJ SOLN
INTRAMUSCULAR | Status: AC
  Filled 2020-05-10: qty 4

## 2020-05-10 MED ORDER — TECHNETIUM TC 99M MERTIATIDE
5.5000 | Freq: Once | INTRAVENOUS | Status: AC | PRN
  Administered 2020-05-10: 5.5 via INTRAVENOUS

## 2020-05-10 MED ORDER — ENOXAPARIN SODIUM 40 MG/0.4ML ~~LOC~~ SOLN
40.0000 mg | SUBCUTANEOUS | Status: DC
  Administered 2020-05-10 – 2020-05-13 (×4): 40 mg via SUBCUTANEOUS
  Filled 2020-05-10 (×4): qty 0.4

## 2020-05-10 NOTE — Progress Notes (Signed)
This RN continues to work on getting records of CT scans from other providers. Issues with getting in touch with someone and issues with finding the right facility that has the correct scans. Will reach out with new phone numbers tomorrow morning.

## 2020-05-10 NOTE — Progress Notes (Signed)
Occupational Therapy Treatment Patient Details Name: Sarah [name] MRN: [id number] DOB: Jan 21, 1941 Today's Date: 05/10/2020    History of present illness Sarah [name] is a 80 y/o female who reported to ED with complaints of body aches, chills, fever, and runny nose. Pt was admitted on 05/06/20 and diagnosed with sepsis with UTI. PMH includes hx of UTIs, chronic lymphocytic leukemia, breast cancer in remission, HTN, HLD, gout, and GERD.   OT comments  Pt progressing well towards OT goals, able to demo mobility to bathroom for toileting task at min guard using RW though notably fatigued after this task. Pt did require Mod A to stand from regular toilet in room, so updated DME recs to include BSC to place over toilet at home for increased ease. Transport in to take pt off of unit, so assisted to transport chair with NT. Plan to progress LB ADLs and endurance during next session.    Follow Up Recommendations  Home health OT;Supervision - Intermittent    Equipment Recommendations  3 in 1 bedside commode (for placement over toilet at home)    Recommendations for Other Services      Precautions / Restrictions Precautions Precautions: Fall Restrictions Weight Bearing Restrictions: No       Mobility Bed Mobility Overal bed mobility: Needs Assistance Bed Mobility: Supine to Sit     Supine to sit: Supervision;HOB elevated     General bed mobility comments: increased time but pt determined to complete task without assist    Transfers Overall transfer level: Needs assistance Equipment used: Rolling walker (2 wheeled) Transfers: Sit to/from [facility] Sit to Stand: Min guard Stand pivot transfers: Min guard       General transfer comment: min guard for sit to stand from bedside with RW, cues for hand placement. able to turn using RW without LOB. Did need Mod A to stand from regular toilet - will need BSC over to increase ease of transfers    Balance Overall balance  assessment: Needs assistance Sitting-balance support: No upper extremity supported;Feet supported Sitting balance-Leahy Scale: Fair     Standing balance support: Bilateral upper extremity supported;During functional activity;Single extremity supported Standing balance-Leahy Scale: Fair Standing balance comment: fair static standing at toilet but need for UE support for dynamic tasks                           ADL either performed or assessed with clinical judgement   ADL Overall ADL's : Needs assistance/impaired                         Toilet Transfer: Min guard;Ambulation;Regular Toilet;Grab bars Toilet Transfer Details (indicate cue type and reason): min guard for mobility to/from bathroom. did have difficulty standing from regular toilet. Has larger BSC in room, will need smaller one placed over toilet Toileting- Clothing Manipulation and Hygiene: Min guard;Sit to/from stand Toileting - Clothing Manipulation Details (indicate cue type and reason): min guard for safety with clothing mgmt     Functional mobility during ADLs: Min guard;Rolling walker General ADL Comments: noted fatigue after mobility to bathroom and toileting task but progressing with steadiness using RW     Vision   Vision Assessment?: No apparent visual deficits   Perception     Praxis      Cognition Arousal/Alertness: Awake/alert Behavior During Therapy: WFL for tasks assessed/performed Overall Cognitive Status: Within Functional Limits for tasks assessed  Exercises     Shoulder Instructions       General Comments VSS on RA. Transport and NT in to take pt off of unit.    Pertinent Vitals/ Pain       Pain Assessment: No/denies pain  Home Living                                          Prior Functioning/Environment              Frequency  Min 2X/week        Progress Toward Goals  OT  Goals(current goals can now be found in the care plan section)  Progress towards OT goals: Progressing toward goals  Acute Rehab OT Goals Patient Stated Goal: get stronger and go home, be able to go visit great grandbaby when born OT Goal Formulation: With patient Time For Goal Achievement: 05/22/20 Potential to Achieve Goals: Good ADL Goals Pt Will Perform Grooming: with supervision;standing Pt Will Perform Upper Body Bathing: with set-up;sitting Pt Will Perform Lower Body Bathing: with supervision;sit to/from stand;with adaptive equipment Pt Will Perform Upper Body Dressing: with set-up;sitting Pt Will Perform Lower Body Dressing: with supervision;with adaptive equipment;sit to/from stand Pt Will Transfer to Toilet: with supervision;ambulating;bedside commode Pt Will Perform Toileting - Clothing Manipulation and hygiene: with supervision;sit to/from stand  Plan Discharge plan needs to be updated    Co-evaluation                 AM-PAC OT "6 Clicks" Daily Activity     Outcome Measure   Help from another person eating meals?: None Help from another person taking care of personal grooming?: A Little Help from another person toileting, which includes using toliet, bedpan, or urinal?: A Little Help from another person bathing (including washing, rinsing, drying)?: A Lot Help from another person to put on and taking off regular upper body clothing?: A Little Help from another person to put on and taking off regular lower body clothing?: A Lot 6 Click Score: 17    End of Session Equipment Utilized During Treatment: Gait belt;Rolling walker  OT Visit Diagnosis: Unsteadiness on feet (R26.81);Other abnormalities of gait and mobility (R26.89);Muscle weakness (generalized) (M62.81)   Activity Tolerance Patient tolerated treatment well   Patient Left Other (comment) (in transport chair with transport)   Nurse Communication          Time: 0109-3235 OT Time Calculation (min):  23 min  Charges: OT General Charges $OT Visit: 1 Visit OT Treatments $Self Care/Home Management : 23-37 mins  Malachy Chamber, OTR/L Acute Rehab Services Office: [phone]   Layla Maw 05/10/2020, 11:23 AM

## 2020-05-10 NOTE — Progress Notes (Signed)
CrCl>30 ml/min>>adjusting lovenox to 40mg  SQ qday.  Onnie Boer, PharmD, BCIDP, AAHIVP, CPP Infectious Disease Pharmacist 05/10/2020 2:33 PM

## 2020-05-10 NOTE — TOC Initial Note (Signed)
Transition of Care [facility]) - Initial/Assessment Note    Patient Details  Name: Sarah [name] MRN: [id number] Date of Birth: [birth year]  [facility] CM/SW Contact:    Ninfa Meeker, RN Phone Number: 05/10/2020, 11:31 AM  Clinical Narrative:  Case manager spoke with patient concerning discharge needs. She lives alone and has family the comes to check on her. Patient has RW. Discussed Home Health agencies. Referral called to Adela Lank, [facility] Liaison.TOC Team will continue to monitor.     Expected Discharge Plan: [place] Barriers to Discharge: Continued Medical Work up   Patient Goals and CMS Choice Patient states their goals for this hospitalization and ongoing recovery are:: get better CMS Medicare.gov Compare Post Acute Care list provided to:: Patient Choice offered to / list presented to : Patient  Expected Discharge Plan and Services Expected Discharge Plan: [place] In-house Referral: NA Discharge Planning Services: CM Consult Post Acute Care Choice: [place] arrangements for the past 2 months: Single Family Home                 DME Arranged: N/A DME Agency: NA       HH Arranged: PT,OT,Nurse's Aide HH Agency: [place] Date [facility] Agency Contacted: 05/10/20 Time HH Agency Contacted: 26 Representative spoke with at [place]: Adela Lank  Prior Living Arrangements/Services Living arrangements for the past 2 months: [place] Lives with:: Self Patient language and need for interpreter reviewed:: Yes Do you feel safe going back to the place where you live?: Yes      Need for Family Participation in Patient Care: Yes (Comment) Care giver support system in place?: Yes (comment) Current home services: DME Criminal Activity/Legal Involvement Pertinent to Current Situation/Hospitalization: Yes - Comment as needed  Activities of Daily Living Home Assistive Devices/Equipment:  Walker (specify type),Shower chair with back,Grab bars in shower,Grab bars around toilet,Hearing aid (rollator) ADL Screening (condition at time of admission) Patient's cognitive ability adequate to safely complete daily activities?: Yes Is the patient deaf or have difficulty hearing?: Yes Does the patient have difficulty seeing, even when wearing glasses/contacts?: No Does the patient have difficulty concentrating, remembering, or making decisions?: No Patient able to express need for assistance with ADLs?: Yes Does the patient have difficulty dressing or bathing?: Yes Independently performs ADLs?: Yes (appropriate for developmental age) Does the patient have difficulty walking or climbing stairs?: Yes Weakness of Legs: Both Weakness of Arms/Hands: None  Permission Sought/Granted Permission sought to share information with : Case Manager       Permission granted to share info w AGENCY: Alvis Lemmings        Emotional Assessment   Attitude/Demeanor/Rapport: Gracious   Orientation: : Oriented to Self,Oriented to Place,Oriented to  Time,Oriented to Situation Alcohol / Substance Use: Not Applicable Psych Involvement: No (comment)  Admission diagnosis:  Acute cystitis without hematuria [N30.00] AKI (acute kidney injury) ([place]) [N17.9] Hypotension, unspecified hypotension type [I95.9] Patient Active Problem List   Diagnosis Date Noted  . AKI (acute kidney injury) ([place]) 05/06/2020  . UTI (urinary tract infection) 05/06/2020  . Hypotension 05/06/2020  . 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 Acute cystitis with hematuria 12/15/2019  . Allergic rhinitis due to pollen 12/08/2019  . Elevated cholesterol 12/08/2019  . History of MI (myocardial infarction) 12/08/2019  . Urinary frequency 12/08/2019  . CLL (chronic lymphocytic leukemia) ([place]) 12/08/2019  . History of gout 12/08/2019   PCP:  Libby Maw, MD  Pharmacy:   [id number] [address], [place]  [place] [address] Phone: [phone] Fax: [phone]     Social Determinants of Health (SDOH) Interventions    Readmission Risk Interventions No flowsheet data found.

## 2020-05-10 NOTE — Progress Notes (Signed)
PROGRESS NOTE                                                                                                                                                                                                             Patient Demographics:    Sarah [name], is a 80 y.o. female, DOB - 14-Mar-1940, VQQ:[id number]  Outpatient Primary MD for the patient is Libby Maw, MD    LOS - 3  Admit date - 05/06/2020    Chief Complaint  Patient presents with  . URI       Brief Narrative (HPI from H&P)   Sarah [name] is a 80 y.o. 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   Subjective:   Patient in bed, appears comfortable, denies any headache, no fever, no chest pain or pressure, no shortness of breath , no abdominal pain. No focal weakness.   Assessment  & Plan :    1. 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   2. AKI and Hypotension - due to sepsis, better with IVF and ABX.  3. CLL - post DC follow with Dr Lorenso Courier.  4. Dyslipidemia - on Zetia.  5.GERD - PPI.   6. Depression - home Rx.  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       Condition - Extremely Guarded  Family Communication  :  Daughter [phone]  On 05/07/20, 05/09/19 8:45 AM full mailbox  Code Status :  DNR  Consults  :  none  PUD  Prophylaxis : None   Procedures  :     Renal Nuclear Scan -  Renal US - L . Hydronephrosis  CT - 1. Moderate to severe retroperitoneal and mesenteric lymphadenopathy suspicious for Lymphoma, Leukemia, or less likely other lymphoproliferative disorder. A CT guided left pararenal lymph node needle  biopsy should be feasible. 2. Sigmoid colon appears inflamed compatible with acute diverticulitis or colitis. Mild secondary inflammation of the nearby Bladder Dome. No evidence of colovesical fistula or abscess. Underlying extensive small bowel diverticulosis. But no other bowel inflammation identified on this noncontrast exam. 3. Moderate to severe left hydronephrosis but with abrupt tapering at the left ureteropelvic junction suggesting UPJ stenosis. Right greater than left nephrolithiasis but no obstructing calculus is identified. No strong evidence of pyelonephritis, although IV contrast would be necessary for that CT diagnosis. 4. Splenomegaly, which may be secondary to #1 rather than due to chronic liver disease. 5. Aortic Atherosclerosis      Disposition Plan  :    Status is: Observation  Dispo: The patient is from: Home              Anticipated d/c is to: Home              Patient currently is not medically stable to d/c.   Difficult to place patient No   DVT Prophylaxis  :  Lovenox    Lab Results  Component Value Date   PLT 198 05/10/2020    Diet :  Diet Order            Diet Heart Room service appropriate? Yes; Fluid consistency: Thin  Diet effective now                  Inpatient Medications  Scheduled Meds: . busPIRone  15 mg Oral BID  . enoxaparin (LOVENOX) injection  30 mg Subcutaneous Q24H  . ezetimibe  10 mg Oral Daily  . feeding supplement  237 mL Oral BID BM  . furosemide      . lidocaine  1 patch Transdermal Q24H  . pantoprazole  40 mg Oral Daily  . potassium chloride  10 mEq Oral Daily  . venlafaxine XR  150 mg Oral Q breakfast   Continuous  Infusions: . cefTRIAXone (ROCEPHIN)  IV 2 g (05/10/20 0935)   PRN Meds:.acetaminophen **OR** acetaminophen, loperamide  Antibiotics  :    Anti-infectives (From admission, onward)   Start     Dose/Rate Route Frequency Ordered Stop   05/07/20 1600  cefTRIAXone (ROCEPHIN) 1 g in sodium chloride 0.9 % 100 mL IVPB  Status:  Discontinued        1 g 200 mL/hr over 30 Minutes Intravenous Every 24 hours 05/06/20 2030 05/07/20 0736   05/07/20 0800  cefTRIAXone (ROCEPHIN) 2 g in sodium chloride 0.9 % 100 mL IVPB        2 g 200 mL/hr over 30 Minutes Intravenous Every 24 hours 05/07/20 0736     05/06/20 1645  cefTRIAXone (ROCEPHIN) 1 g in sodium chloride 0.9 % 100 mL IVPB        1 g 200 mL/hr over 30 Minutes Intravenous  Once 05/06/20 1636 05/06/20 1753       Time Spent in minutes  30   Lala Lund M.D on 05/10/2020 at 1:29 PM  To page go to [email or web address]   [facility] -  Office  [phone]    See all Orders from today for further details    Objective:   Vitals:   05/09/20 2042 05/09/20 2339 05/10/20 0344 05/10/20 0803  BP: 137/88 138/80 135/65 (!) 143/90  Pulse: 92 84 84 85  Resp: 17 (!) 25 (!) 21 20  Temp: 99.6 F (37.6 C) 98.4 F (36.9 C) 98.5 F (36.9 C) 98.6 F (37 C)  TempSrc: Oral Oral  Oral Oral  SpO2: 100% 96% 96% 96%  Weight:      Height:        Wt Readings from Last 3 Encounters:  05/08/20 76.2 kg  04/26/20 73.3 kg  02/10/20 73.8 kg     Intake/Output Summary (Last 24 hours) at 05/10/2020 1329 Last data filed at 05/10/2020 0600 Gross per 24 hour  Intake --  Output 1400 ml  Net -1400 ml     Physical Exam  Awake Alert, No new F.N deficits, Normal affect [place].AT,PERRAL Supple Neck,No JVD, No cervical lymphadenopathy appriciated.  Symmetrical Chest wall movement, Good air movement bilaterally, CTAB RRR,No Gallops, Rubs or new Murmurs, No Parasternal Heave +ve B.Sounds, Abd Soft, No tenderness, No organomegaly appriciated, No rebound - guarding  or rigidity. No Cyanosis, Clubbing or edema, No new Rash or bruise     Data Review:    CBC Recent Labs  Lab 05/06/20 1518 05/07/20 0139 05/08/20 0221 05/09/20 0244 05/10/20 0337  WBC 41.0* 29.6* 26.8* 26.6* 26.0*  HGB 10.0* 8.6* 8.8* 8.4* 9.1*  HCT 32.6* 26.9* 26.6* 25.7* 28.8*  PLT 214 192 195 191 198  MCV 104.8* 102.7* 100.4* 101.6* 102.9*  MCH 32.2 32.8 33.2 33.2 32.5  MCHC 30.7 32.0 33.1 32.7 31.6  RDW 15.5 15.2 15.1 15.0 14.8  LYMPHSABS 36.4*  --  23.5* 18.6* 23.1*  MONOABS 0.5  --  0.4 0.3 0.3  EOSABS 0.3  --  0.3 1.1* 0.4  BASOSABS 0.0  --  0.1 0.3* 0.1    Recent Labs  Lab 05/06/20 1518 05/06/20 2241 05/07/20 0139 05/07/20 0223 05/08/20 0221 05/09/20 0244 05/10/20 0337  NA 137  --  135  --  139 138 139  K 4.6  --  4.3  --  4.4 4.0 3.7  CL 107  --  107  --  108 106 106  CO2 20*  --  21*  --  23 24 25   GLUCOSE 125*  --  129*  --  113* 108* 102*  BUN 31*  --  28*  --  17 15 10   CREATININE 1.41*  --  1.29*  --  1.13* 1.11* 1.09*  CALCIUM 8.4*  --  8.3*  --  8.7* 8.5* 8.8*  AST 25  --   --   --  16 14* 17  ALT 24  --   --   --  18 16 14   ALKPHOS 171*  --   --   --  127* 106 114  BILITOT 0.4  --   --   --  0.5 0.4 0.4  ALBUMIN 3.7  --   --   --  2.7* 2.5* 2.6*  MG  --   --   --  1.9 1.7 1.7 1.7  PROCALCITON  --   --   --  0.29 0.22 0.14  --   LATICACIDVEN 1.1 1.1  --   --   --   --   --   BNP  --   --   --  193.1* 252.0* 135.8* 257.5*    ------------------------------------------------------------------------------------------------------------------ No results for input(s): CHOL, HDL, LDLCALC, TRIG, CHOLHDL, LDLDIRECT in the last 72 hours.  No results found for: HGBA1C ------------------------------------------------------------------------------------------------------------------ No results for input(s): TSH, T4TOTAL, T3FREE, THYROIDAB in the last 72 hours.  Invalid input(s): FREET3  Cardiac Enzymes No results for input(s): CKMB, TROPONINI,  MYOGLOBIN in the last 168 hours.  Invalid input(s): CK ------------------------------------------------------------------------------------------------------------------    Component Value Date/Time   BNP 257.5 (H) 05/10/2020  0337    Micro Results Recent Results (from the past 240 hour(s))  Covid-19, Flu A+B ([facility])     Status: None   Collection Time: 05/06/20  2:11 PM   Specimen: Nasopharyngeal   Naso  Result Value Ref Range Status   SARS-CoV-2, NAA Not Detected Not Detected Final    Comment: This nucleic acid amplification test was developed and its performance characteristics determined by [employer]. Nucleic acid amplification tests include RT-PCR and TMA. This test has not been FDA cleared or approved. This test has been authorized by FDA under an Emergency Use Authorization (EUA). This test is only authorized for the duration of time the declaration that circumstances exist justifying the authorization of the emergency use of in vitro diagnostic tests for detection of SARS-CoV-2 virus and/or diagnosis of COVID-19 infection under section 564(b)(1) of the Act, 21 U.S.C. 628ZMO-2(H) (1), unless the authorization is terminated or revoked sooner. When diagnostic testing is negative, the possibility of a false negative result should be considered in the context of a patient's recent exposures and the presence of clinical signs and symptoms consistent with COVID-19. An individual without symptoms of COVID-19 and who is not shedding SARS-CoV-2 virus wo uld expect to have a negative (not detected) result in this assay.    Influenza A, NAA Not Detected Not Detected Final   Influenza B, NAA Not Detected Not Detected Final  Resp Panel by RT-PCR (Flu A&B, Covid) Nasopharyngeal Swab     Status: None   Collection Time: 05/06/20  3:18 PM   Specimen: Nasopharyngeal Swab; Nasopharyngeal(NP) swabs in vial transport medium  Result Value Ref Range Status   SARS Coronavirus 2 by  RT PCR NEGATIVE NEGATIVE Final    Comment: (NOTE) SARS-CoV-2 target nucleic acids are NOT DETECTED.  The SARS-CoV-2 RNA is generally detectable in upper respiratory specimens during the acute phase of infection. The lowest concentration of SARS-CoV-2 viral copies this assay can detect is 138 copies/mL. A negative result does not preclude SARS-Cov-2 infection and should not be used as the sole basis for treatment or other patient management decisions. A negative result may occur with  improper specimen collection/handling, submission of specimen other than nasopharyngeal swab, presence of viral mutation(s) within the areas targeted by this assay, and inadequate number of viral copies(<138 copies/mL). A negative result must be combined with clinical observations, patient history, and epidemiological information. The expected result is Negative.  Fact Sheet for Patients:  EntrepreneurPulse.com.au  Fact Sheet for Healthcare Providers:  IncredibleEmployment.be  This test is no t yet approved or cleared by the [place] FDA and  has been authorized for detection and/or diagnosis of SARS-CoV-2 by FDA under an Emergency Use Authorization (EUA). This EUA will remain  in effect (meaning this test can be used) for the duration of the COVID-19 declaration under Section 564(b)(1) of the Act, 21 U.S.C.section 360bbb-3(b)(1), unless the authorization is terminated  or revoked sooner.       Influenza A by PCR NEGATIVE NEGATIVE Final   Influenza B by PCR NEGATIVE NEGATIVE Final    Comment: (NOTE) The Xpert Xpress SARS-CoV-2/FLU/RSV plus assay is intended as an aid in the diagnosis of influenza from Nasopharyngeal swab specimens and should not be used as a sole basis for treatment. Nasal washings and aspirates are unacceptable for Xpert Xpress SARS-CoV-2/FLU/RSV testing.  Fact Sheet for Patients: EntrepreneurPulse.com.au  Fact Sheet  for Healthcare Providers: IncredibleEmployment.be  This test is not yet approved or cleared by the [place] FDA and has been  authorized for detection and/or diagnosis of SARS-CoV-2 by FDA under an Emergency Use Authorization (EUA). This EUA will remain in effect (meaning this test can be used) for the duration of the COVID-19 declaration under Section 564(b)(1) of the Act, 21 U.S.C. section 360bbb-3(b)(1), unless the authorization is terminated or revoked.  Performed at [facility]   Culture, blood (routine x 2)     Status: None (Preliminary result)   Collection Time: 05/06/20  3:20 PM   Specimen: BLOOD  Result Value Ref Range Status   Specimen Description   Final    BLOOD Performed at [facility]    Special Requests   Final    LEFT ANTECUBITAL Blood Culture adequate volume Performed at [facility]    Culture   Final    NO GROWTH 3 DAYS Performed at [facility], [place] [address] [place], [place] [address]    Report Status PENDING  Incomplete  Culture, blood (routine x 2)     Status: None (Preliminary result)   Collection Time: 05/06/20  4:50 PM   Specimen: BLOOD  Result Value Ref Range Status   Specimen Description   Final    BLOOD Performed at [facility]    Special Requests   Final    RIGHT ANTECUBITAL Blood Culture adequate volume Performed at [facility]    Culture   Final    NO GROWTH 3 DAYS Performed at [facility], [place] [address], [place], [place] [address]    Report Status PENDING  Incomplete    Radiology Reports CT ABDOMEN PELVIS WO CONTRAST  Result Date: 05/08/2020 CLINICAL DATA:  80 year old female with pyelonephritis. EXAM: CT ABDOMEN AND PELVIS WITHOUT CONTRAST TECHNIQUE: Multidetector CT imaging of the abdomen and pelvis was performed following the standard protocol without IV contrast. COMPARISON:  Renal ultrasound yesterday.  FINDINGS: Lower chest: Mild cardiomegaly. Tortuous descending thoracic aorta. No pericardial effusion. Minimal lung base atelectasis. Hepatobiliary: Mildly nodular liver contour. Circumscribed round low-density area measuring 12 mm on series 3, image 21 has simple fluid density and is likely a benign cyst. No other discrete liver lesion on this noncontrast exam. Diminutive or absent gallbladder. Pancreas: Negative noncontrast pancreas. Spleen: Splenomegaly. Estimated splenic volume 535 mL (normal splenic volume range 83 - 412 mL). No discrete splenic lesion. Adrenals/Urinary Tract: Negative adrenal glands. Right kidney appears nonobstructed although there is a round 9 mm calculus in the right renal pelvis (series 6, image 82). Right ureter is decompressed. The distal ureter is difficult to delineate but appears to remain decompressed to the to the bladder. There is moderate to severe left hydronephrosis. But the left ureteropelvic junction appears abruptly tapered on coronal image 79. Only a punctate left intrarenal calculus is identified. No left hydroureter is evident. The left UVJ appears normal. Diminutive urinary bladder, with mild bladder wall thickening in the region of sigmoid colon inflammatory stranding. See below. Numerous pelvic phleboliths. Stomach/Bowel: Retained stool in the rectum with no rectal wall thickening or inflammation identified. However, the upstream sigmoid colon is thickened and with mild mesenteric stranding as seen on coronal image 68 which extends to the nearby bladder dome. There are diverticula in the region. This inflammation is in a segment of fiber 6 cm. Diverticulosis in the upstream descending colon without active inflammation. Mild retained stool proximal to the sigmoid. Diminutive or absent appendix. Terminal ileum also remarkable for fairly extensive diverticulosis which tracks into the upstream distal small bowel.  No dilated small bowel. No free air or free fluid. No other  convincing acute bowel inflammation. Stomach and duodenum are decompressed. Vascular/Lymphatic: Aortoiliac calcified atherosclerosis. Tortuous abdominal aorta. Vascular patency is not evaluated in the absence of IV contrast. Additionally, there is nonspecific appearing lymphadenopathy in the small bowel mesentery (coronal images 54-59) with individual lymph nodes up to 13 mm short axis. Associated haziness of the root of the mesentery. Furthermore, there is bulky left side retroperitoneal lymphadenopathy, with individual pararenal nodes up to 23 mm short axis. The lymphadenopathy abates at the pelvic inlet. No inguinal lymphadenopathy. Reproductive: Absent uterus.  Diminutive or absent ovaries. Other: Mild presacral stranding is nonspecific. No pelvic free fluid. Musculoskeletal: Several chronic left posterior rib fractures. Grade 1 degenerative lower lumbar spondylolisthesis with severe facet degeneration. Severe chronic disc degeneration at the lumbosacral junction. No acute osseous abnormality identified. IMPRESSION: 1. Moderate to severe retroperitoneal and mesenteric lymphadenopathy suspicious for Lymphoma, Leukemia, or less likely other lymphoproliferative disorder. A CT guided left pararenal lymph node needle biopsy should be feasible. 2. Sigmoid colon appears inflamed compatible with acute diverticulitis or colitis. Mild secondary inflammation of the nearby Bladder Dome. No evidence of colovesical fistula or abscess. Underlying extensive small bowel diverticulosis. But no other bowel inflammation identified on this noncontrast exam. 3. Moderate to severe left hydronephrosis but with abrupt tapering at the left ureteropelvic junction suggesting UPJ stenosis. Right greater than left nephrolithiasis but no obstructing calculus is identified. No strong evidence of pyelonephritis, although IV contrast would be necessary for that CT diagnosis. 4. Splenomegaly, which may be secondary to #1 rather than due to  chronic liver disease. 5. Aortic Atherosclerosis (ICD10-I70.0). Electronically Signed   By: Genevie Ann M.D.   On: 05/08/2020 09:16   US RENAL  Result Date: 05/07/2020 CLINICAL DATA:  Pyelonephritis. EXAM: RENAL / URINARY TRACT ULTRASOUND COMPLETE COMPARISON:  None. FINDINGS: Right Kidney: Renal measurements: 11.4 x 5.1 x 5.1 cm = volume: 154.6 mL. Echogenicity within normal limits. No mass or hydronephrosis visualized. Left Kidney: Renal measurements: 10.8 x 5.3 x 6.6 cm = volume: 198.8 mL. Moderate hydronephrosis identified on the left. Bladder: Appears normal for degree of bladder distention. Other: The spleen is enlarged measuring 13.8 x 12.7 x 5.8 cm with a volume of 537 cc. IMPRESSION: 1. Moderate left hydronephrosis of uncertain chronicity. Acute/active obstruction not excluded on this study. Recommend clinical correlation. CT imaging could further evaluate for an underlying cause. 2. Splenomegaly. Electronically Signed   By: Dorise Bullion III M.D   On: 05/07/2020 16:55   DG Chest Portable 1 View  Result Date: 05/06/2020 CLINICAL DATA:  Fever, chills and cough. EXAM: PORTABLE CHEST 1 VIEW COMPARISON:  None. FINDINGS: The lungs are hyperinflated. There is no evidence of acute infiltrate, pleural effusion or pneumothorax. The heart size and mediastinal contours are within normal limits. Multiple chronic left-sided rib fractures are seen. A radiopaque fusion plate and screws are seen within the proximal right humerus. IMPRESSION: No active cardiopulmonary disease. Electronically Signed   By: Virgina Norfolk M.D.   On: 05/06/2020 15:41

## 2020-05-10 NOTE — Progress Notes (Signed)
PT Cancellation Note  Patient Details Name: Sarah [name] MRN: [id number] DOB: 01-Aug-1940   Cancelled Treatment:    Reason Eval/Treat Not Completed: (P) Patient at procedure or test/unavailable Pt is off the floor for procedure. PT will follow back for treatment this afternoon as able.   Yazleemar Strassner B. Migdalia Dk PT, DPT Acute Rehabilitation Services Pager [phone] Office [phone]    [place] 05/10/2020, 11:50 AM

## 2020-05-11 ENCOUNTER — Other Ambulatory Visit: Payer: Self-pay | Admitting: Urology

## 2020-05-11 LAB — CBC WITH DIFFERENTIAL/PLATELET
Abs Immature Granulocytes: 0 10*3/uL (ref 0.00–0.07)
Basophils Absolute: 0 10*3/uL (ref 0.0–0.1)
Basophils Relative: 0 %
Eosinophils Absolute: 0.6 10*3/uL — ABNORMAL HIGH (ref 0.0–0.5)
Eosinophils Relative: 2 %
HCT: 29 % — ABNORMAL LOW (ref 36.0–46.0)
Hemoglobin: 9.2 g/dL — ABNORMAL LOW (ref 12.0–15.0)
Lymphocytes Relative: 82 %
Lymphs Abs: 22.9 10*3/uL — ABNORMAL HIGH (ref 0.7–4.0)
MCH: 32.2 pg (ref 26.0–34.0)
MCHC: 31.7 g/dL (ref 30.0–36.0)
MCV: 101.4 fL — ABNORMAL HIGH (ref 80.0–100.0)
Monocytes Absolute: 0 10*3/uL — ABNORMAL LOW (ref 0.1–1.0)
Monocytes Relative: 0 %
Neutro Abs: 4.5 10*3/uL (ref 1.7–7.7)
Neutrophils Relative %: 16 %
Platelets: 198 10*3/uL (ref 150–400)
RBC: 2.86 MIL/uL — ABNORMAL LOW (ref 3.87–5.11)
RDW: 15 % (ref 11.5–15.5)
WBC: 27.9 10*3/uL — ABNORMAL HIGH (ref 4.0–10.5)
nRBC: 0 % (ref 0.0–0.2)
nRBC: 3 /100 WBC — ABNORMAL HIGH

## 2020-05-11 LAB — COMPREHENSIVE METABOLIC PANEL
ALT: 15 U/L (ref 0–44)
AST: 16 U/L (ref 15–41)
Albumin: 2.7 g/dL — ABNORMAL LOW (ref 3.5–5.0)
Alkaline Phosphatase: 99 U/L (ref 38–126)
Anion gap: 8 (ref 5–15)
BUN: 11 mg/dL (ref 8–23)
CO2: 27 mmol/L (ref 22–32)
Calcium: 8.5 mg/dL — ABNORMAL LOW (ref 8.9–10.3)
Chloride: 102 mmol/L (ref 98–111)
Creatinine, Ser: 1.23 mg/dL — ABNORMAL HIGH (ref 0.44–1.00)
GFR, Estimated: 44 mL/min — ABNORMAL LOW (ref >60)
Glucose, Bld: 95 mg/dL (ref 70–99)
Potassium: 3.4 mmol/L — ABNORMAL LOW (ref 3.5–5.1)
Sodium: 137 mmol/L (ref 135–145)
Total Bilirubin: 0.2 mg/dL — ABNORMAL LOW (ref 0.3–1.2)
Total Protein: 4.9 g/dL — ABNORMAL LOW (ref 6.5–8.1)

## 2020-05-11 LAB — BRAIN NATRIURETIC PEPTIDE: B Natriuretic Peptide: 115.3 pg/mL — ABNORMAL HIGH (ref 0.0–100.0)

## 2020-05-11 LAB — MAGNESIUM: Magnesium: 1.6 mg/dL — ABNORMAL LOW (ref 1.7–2.4)

## 2020-05-11 MED ORDER — POTASSIUM CHLORIDE CRYS ER 20 MEQ PO TBCR
40.0000 meq | EXTENDED_RELEASE_TABLET | Freq: Once | ORAL | Status: AC
  Administered 2020-05-11: 40 meq via ORAL
  Filled 2020-05-11: qty 2

## 2020-05-11 MED ORDER — POTASSIUM CHLORIDE CRYS ER 10 MEQ PO TBCR
10.0000 meq | EXTENDED_RELEASE_TABLET | Freq: Every day | ORAL | Status: DC
  Administered 2020-05-12 – 2020-05-14 (×3): 10 meq via ORAL
  Filled 2020-05-11 (×3): qty 1

## 2020-05-11 MED ORDER — HYDRALAZINE HCL 20 MG/ML IJ SOLN: 10.0000 mg | Freq: Four times a day (QID) | INTRAMUSCULAR | Status: DC | PRN

## 2020-05-11 MED ORDER — MAGNESIUM SULFATE 2 GM/50ML IV SOLN
2.0000 g | Freq: Once | INTRAVENOUS | Status: AC
  Administered 2020-05-11: 2 g via INTRAVENOUS
  Filled 2020-05-11: qty 50

## 2020-05-11 MED ORDER — LACTATED RINGERS IV SOLN: INTRAVENOUS | Status: AC

## 2020-05-11 MED ORDER — AMLODIPINE BESYLATE 10 MG PO TABS
10.0000 mg | ORAL_TABLET | Freq: Every day | ORAL | Status: DC
  Administered 2020-05-11 – 2020-05-14 (×4): 10 mg via ORAL
  Filled 2020-05-11 (×4): qty 1

## 2020-05-11 NOTE — Progress Notes (Signed)
Urology Inpatient Progress Report  Acute cystitis without hematuria [N30.00] AKI (acute kidney injury) ([place]) [N17.9] Hypotension, unspecified hypotension type [I95.9]  Procedure(s): CYSTOSCOPY WITH STENT PLACEMENT      Intv/Subj: No acute events overnight. Patient is without complaint. Still having low grade fevers 100.3. Renal scan confirmed obstructed left kidney with 23%function  Principal Problem:   UTI (urinary tract infection) Active Problems:   Elevated cholesterol   CLL (chronic lymphocytic leukemia) ([facility])   AKI (acute kidney injury) ([place])   Hypotension  Current Facility-Administered Medications  Medication Dose Route Frequency Provider Last Rate Last Admin  . acetaminophen (TYLENOL) tablet 650 mg  650 mg Oral Q6H PRN Shela Leff, MD   650 mg at 05/11/20 2134   Or  . acetaminophen (TYLENOL) suppository 650 mg  650 mg Rectal Q6H PRN Shela Leff, MD      . amLODipine (NORVASC) tablet 10 mg  10 mg Oral Daily Thurnell Lose, MD   10 mg at 05/11/20 1709  . busPIRone (BUSPAR) tablet 15 mg  15 mg Oral BID Thurnell Lose, MD   15 mg at 05/11/20 2134  . cefTRIAXone (ROCEPHIN) 2 g in sodium chloride 0.9 % 100 mL IVPB  2 g Intravenous Q24H Thurnell Lose, MD 200 mL/hr at 05/11/20 0928 2 g at 05/11/20 0928  . enoxaparin (LOVENOX) injection 40 mg  40 mg Subcutaneous Q24H Pham, Minh Q, RPH-CPP   40 mg at 05/11/20 2134  . ezetimibe (ZETIA) tablet 10 mg  10 mg Oral Daily Thurnell Lose, MD   10 mg at 05/11/20 0931  . feeding supplement (ENSURE ENLIVE / ENSURE PLUS) liquid 237 mL  237 mL Oral BID BM Caren Griffins, MD   237 mL at 05/11/20 0931  . hydrALAZINE (APRESOLINE) injection 10 mg  10 mg Intravenous Q6H PRN Thurnell Lose, MD      . lidocaine (LIDODERM) 5 % 1 patch  1 patch Transdermal Q24H Zierle-Ghosh, Asia B, DO   1 patch at 05/10/20 0646  . loperamide (IMODIUM) capsule 2 mg  2 mg Oral Q6H PRN Thurnell Lose, MD      . pantoprazole (PROTONIX)  EC tablet 40 mg  40 mg Oral Daily Thurnell Lose, MD   40 mg at 05/11/20 0931  . [START ON 05/12/2020] potassium chloride (KLOR-CON) CR tablet 10 mEq  10 mEq Oral Daily Thurnell Lose, MD      . venlafaxine XR (EFFEXOR-XR) 24 hr capsule 150 mg  150 mg Oral Q breakfast Lala Lund K, MD   150 mg at 05/11/20 0930     Objective: Vital: Vitals:   05/10/20 1958 05/10/20 2129 05/11/20 0500 05/11/20 2032  BP: (!) 144/87  (!) 150/78 129/66  Pulse: 100  91 95  Resp: 20  17 (!) 22  Temp: 100.2 F (37.9 C) 98.1 F (36.7 C) 98.7 F (37.1 C) 100.3 F (37.9 C)  TempSrc: Oral Oral Oral Oral  SpO2: 97%  99% 97%  Weight:      Height:       I/Os: I/O last 3 completed shifts: In: 100 [P.O.:100] Out: 1000 [Urine:1000]  Physical Exam:  General: Patient is in no apparent distress Lungs: Normal respiratory effort, chest expands symmetrically. GI: The abdomen is soft and nontender without mass. Ext: lower extremities symmetric  Lab Results: Recent Labs    05/09/20 0244 05/10/20 0337 05/11/20 0302  WBC 26.6* 26.0* 27.9*  HGB 8.4* 9.1* 9.2*  HCT 25.7* 28.8* 29.0*  Recent Labs    05/09/20 0244 05/10/20 0337 05/11/20 0302  NA 138 139 137  K 4.0 3.7 3.4*  CL 106 106 102  CO2 24 25 27   GLUCOSE 108* 102* 95  BUN 15 10 11   CREATININE 1.11* 1.09* 1.23*  CALCIUM 8.5* 8.8* 8.5*   No results for input(s): LABPT, INR in the last 72 hours. No results for input(s): LABURIN in the last 72 hours. Results for orders placed or performed during the hospital encounter of 05/06/20  Resp Panel by RT-PCR (Flu A&B, Covid) Nasopharyngeal Swab     Status: None   Collection Time: 05/06/20  3:18 PM   Specimen: Nasopharyngeal Swab; Nasopharyngeal(NP) swabs in vial transport medium  Result Value Ref Range Status   SARS Coronavirus 2 by RT PCR NEGATIVE NEGATIVE Final    Comment: (NOTE) SARS-CoV-2 target nucleic acids are NOT DETECTED.  The SARS-CoV-2 RNA is generally detectable in upper  respiratory specimens during the acute phase of infection. The lowest concentration of SARS-CoV-2 viral copies this assay can detect is 138 copies/mL. A negative result does not preclude SARS-Cov-2 infection and should not be used as the sole basis for treatment or other patient management decisions. A negative result may occur with  improper specimen collection/handling, submission of specimen other than nasopharyngeal swab, presence of viral mutation(s) within the areas targeted by this assay, and inadequate number of viral copies(<138 copies/mL). A negative result must be combined with clinical observations, patient history, and epidemiological information. The expected result is Negative.  Fact Sheet for Patients:  EntrepreneurPulse.com.au  Fact Sheet for Healthcare Providers:  IncredibleEmployment.be  This test is no t yet approved or cleared by the [place] FDA and  has been authorized for detection and/or diagnosis of SARS-CoV-2 by FDA under an Emergency Use Authorization (EUA). This EUA will remain  in effect (meaning this test can be used) for the duration of the COVID-19 declaration under Section 564(b)(1) of the Act, 21 U.S.C.section 360bbb-3(b)(1), unless the authorization is terminated  or revoked sooner.       Influenza A by PCR NEGATIVE NEGATIVE Final   Influenza B by PCR NEGATIVE NEGATIVE Final    Comment: (NOTE) The Xpert Xpress SARS-CoV-2/FLU/RSV plus assay is intended as an aid in the diagnosis of influenza from Nasopharyngeal swab specimens and should not be used as a sole basis for treatment. Nasal washings and aspirates are unacceptable for Xpert Xpress SARS-CoV-2/FLU/RSV testing.  Fact Sheet for Patients: EntrepreneurPulse.com.au  Fact Sheet for Healthcare Providers: IncredibleEmployment.be  This test is not yet approved or cleared by the [place] FDA and has been  authorized for detection and/or diagnosis of SARS-CoV-2 by FDA under an Emergency Use Authorization (EUA). This EUA will remain in effect (meaning this test can be used) for the duration of the COVID-19 declaration under Section 564(b)(1) of the Act, 21 U.S.C. section 360bbb-3(b)(1), unless the authorization is terminated or revoked.  Performed at [facility]   Culture, blood (routine x 2)     Status: None (Preliminary result)   Collection Time: 05/06/20  3:20 PM   Specimen: BLOOD  Result Value Ref Range Status   Specimen Description   Final    BLOOD Performed at [facility]    Special Requests   Final    LEFT ANTECUBITAL Blood Culture adequate volume Performed at [facility]    Culture   Final    NO GROWTH 4 DAYS Performed at [facility], [place]  [address], [place], [place] [address]    Report Status PENDING  Incomplete  Culture, blood (routine x 2)     Status: None (Preliminary result)   Collection Time: 05/06/20  4:50 PM   Specimen: BLOOD  Result Value Ref Range Status   Specimen Description   Final    BLOOD Performed at [facility]    Special Requests   Final    RIGHT ANTECUBITAL Blood Culture adequate volume Performed at [facility]    Culture   Final    NO GROWTH 4 DAYS Performed at [facility], [place] [address] [place], [place] [address]    Report Status PENDING  Incomplete    Studies/Results: NM Renal Imaging Flow W/Pharm  Result Date: 05/10/2020 CLINICAL DATA:  Evaluate hydronephrosis of the left kidney. Status post bilobed plasty. EXAM: NUCLEAR MEDICINE RENAL SCAN WITH DIURETIC ADMINISTRATION TECHNIQUE: Radionuclide angiographic and sequential renal images were obtained after intravenous injection of radiopharmaceutical. Imaging was continued during slow intravenous injection of Lasix approximately 15 minutes after the start of the examination.  RADIOPHARMACEUTICALS:  5.5 mCi Technetium-61m MAG3 IV COMPARISON:  05/08/2020 FINDINGS: Flow: Normal flow to the right kidney. Markedly diminished in delayed perfusion to the left kidney. Left renogram: Asymmetric delayed cortical uptake, excretion, and clearance of the radiopharmaceutical. On the postvoid images there is persistent radiopharmaceutical accumulation within the dilated left renal collecting system. Right renogram: Normal cortical uptake, excretion in clearance of the radiopharmaceutical. Differential: Left kidney = 23 % Right kidney = 77 % T1/2 post Lasix : Technical difficulties with Lasix administration resulted in the diuretic being administered at 80 minutes with only 10 minutes remaining. IMPRESSION: 1. Normally functioning right kidney. 2. Delayed left renal perfusion, cortical uptake, excretion and clearance of the radiopharmaceutical consistent with obstructive uropathy. Electronically Signed   By: Kerby Moors M.D.   On: 05/10/2020 14:35    Assessment: Left hydronephrosis UTI  Plan: Plan for cystoscopy with left ureteral stent placement tomorrow. Risks and benefits discussed   Link Snuffer, MD Urology 05/11/2020, 10:30 PM

## 2020-05-11 NOTE — Progress Notes (Signed)
Physical Therapy Treatment Patient Details Name: Sarah [name] MRN: [id number] DOB: 1940/04/08 Today's Date: 05/11/2020    History of Present Illness Sarah [name] is a 80 y/o female who reported to ED with complaints of body aches, chills, fever, and runny nose. Pt was admitted on 05/06/20 and diagnosed with sepsis with UTI. PMH includes hx of UTIs, chronic lymphocytic leukemia, breast cancer in remission, HTN, HLD, gout, and GERD.    PT Comments    The pt was able to demo good progress with mobility and ambulation in the room this session. The pt was able to complete multiple sit-stand transfers from EOB with supervision and use of RW, as well as other standing exercises such as standing marches to initiate pre-gait training and strengthening. After 2-3 min seated rest, the pt was able to complete multiple short bouts of ambulation in the room with BUE support and minG for safety. No LOB at this time, but pt reports significant fatigue after short bout requiring rest. VSS. The pt will continue to benefit from targeted strengthening, endurance training, and stability exercises to facilitate return to independence with mobility and allow for safe d/c home with family support.     Follow Up Recommendations  Home health PT;Supervision for mobility/OOB     Equipment Recommendations  None recommended by PT    Recommendations for Other Services       Precautions / Restrictions Precautions Precautions: Fall Restrictions Weight Bearing Restrictions: No    Mobility  Bed Mobility Overal bed mobility: Needs Assistance Bed Mobility: Supine to Sit     Supine to sit: Supervision;HOB elevated     General bed mobility comments: increased time but pt determined to complete task without assist    Transfers Overall transfer level: Needs assistance Equipment used: Rolling walker (2 wheeled) Transfers: Sit to/from [facility] Sit to Stand: Min guard Stand pivot transfers: Min  guard       General transfer comment: minG to complete multiple sit-stand from EOB as warm up. no physical assist. then completed stand-pivot with minG and intermittent minA to RW to manage turn.  Ambulation/Gait Ambulation/Gait assistance: Min guard Gait Distance (Feet): 8 Feet (forwards and back, x2) Assistive device: Rolling walker (2 wheeled) Gait Pattern/deviations: Step-through pattern;Decreased stride length;Trunk flexed Gait velocity: decreased   General Gait Details: cues for posture, increased stride length. pt with no evidence of instability, but with heavy relaince on BUE support         Balance Overall balance assessment: Needs assistance Sitting-balance support: No upper extremity supported;Feet supported Sitting balance-Leahy Scale: Fair     Standing balance support: Bilateral upper extremity supported;During functional activity;Single extremity supported Standing balance-Leahy Scale: Fair Standing balance comment: fair static standing at toilet but need for UE support for dynamic tasks                            Cognition Arousal/Alertness: Awake/alert Behavior During Therapy: WFL for tasks assessed/performed Overall Cognitive Status: Within Functional Limits for tasks assessed                                        Exercises Other Exercises Other Exercises: sit-stand from EOB 2 x 3 with minG and RW Other Exercises: standing marches at EOB, x5 each leg wwith BUE support on RW    General Comments General comments (skin integrity, edema, etc.): VSS,  HR to 120s with mobility, recoverd with 2-3 min seated rest      Pertinent Vitals/Pain Pain Assessment: No/denies pain           PT Goals (current goals can now be found in the care plan section) Acute Rehab PT Goals Patient Stated Goal: get stronger and go home, be able to go visit great grandbaby when born PT Goal Formulation: With patient Time For Goal Achievement:  05/21/20 Potential to Achieve Goals: Good Progress towards PT goals: Progressing toward goals    Frequency    Min 3X/week      PT Plan Current plan remains appropriate    M-PAC PT "6 Clicks" Mobility   Outcome Measure  Help needed turning from your back to your side while in a flat bed without using bedrails?: A Little Help needed moving from lying on your back to sitting on the side of a flat bed without using bedrails?: A Little Help needed moving to and from a bed to a chair (including a wheelchair)?: A Little Help needed standing up from a chair using your arms (e.g., wheelchair or bedside chair)?: A Little Help needed to walk in hospital room?: A Little Help needed climbing 3-5 steps with a railing? : A Little 6 Click Score: 18    End of Session Equipment Utilized During Treatment: Gait belt Activity Tolerance: Patient tolerated treatment well Patient left: in chair;with call bell/phone within reach;with chair alarm set Nurse Communication: Mobility status PT Visit Diagnosis: Unsteadiness on feet (R26.81);Muscle weakness (generalized) (M62.81)     Time: 8416-6063 PT Time Calculation (min) (ACUTE ONLY): 31 min  Charges:  $Gait Training: 8-22 mins $Therapeutic Exercise: 8-22 mins                     Karma Ganja, PT, DPT   Acute Rehabilitation Department Pager #: [phone]   Otho Bellows 05/11/2020, 1:44 PM

## 2020-05-11 NOTE — Progress Notes (Signed)
Past CT scans unable to be retrieved from previous physicians offices.

## 2020-05-11 NOTE — Progress Notes (Signed)
PROGRESS NOTE                                                                                                                                                                                                             Patient Demographics:    Sarah [name], is a 80 y.o. female, DOB - Oct 04, 1940, TML:[id number]  Outpatient Primary MD for the patient is Libby Maw, MD    LOS - 4  Admit date - 05/06/2020    Chief Complaint  Patient presents with  . URI       Brief Narrative (HPI from H&P)   Sarah [name] is a 80 y.o. 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   Subjective:   Patient in bed, appears comfortable, denies any headache, no fever, no chest pain or pressure, no shortness of breath , no abdominal pain. No focal weakness.    Assessment  & Plan :    1. 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 Gloriann Loan on 05/11/2020, plan is to place ureteric stent on 05/12/2020 in the afternoon   2. AKI and Hypotension - due to sepsis, better with IVF and ABX.  3. CLL - post DC follow with Dr Lorenso Courier.  4. Dyslipidemia - on Zetia.  5.GERD - PPI.   6. Depression - home Rx.  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  8. HTN - placed on  Norvasc along with as needed IV hydralazine.      Condition - Extremely Guarded  Family Communication  :  Daughter [phone]  On 05/07/20, 05/09/19 8:45 AM full mailbox  Code Status :  DNR  Consults  : Urology  PUD Prophylaxis : None   Procedures  :     Renal Nuclear Scan -  obstructive uropathy  Renal US - L . Hydronephrosis  CT - 1. Moderate to severe retroperitoneal and mesenteric lymphadenopathy suspicious for Lymphoma, Leukemia, or less likely other lymphoproliferative disorder. A CT guided left pararenal lymph node needle biopsy should be feasible. 2. Sigmoid colon appears inflamed compatible with acute diverticulitis or colitis. Mild secondary inflammation of the nearby Bladder Dome. No evidence of colovesical fistula or abscess. Underlying extensive small bowel diverticulosis. But no other bowel inflammation identified on this noncontrast exam. 3. Moderate to severe left hydronephrosis but with abrupt tapering at the left ureteropelvic junction suggesting UPJ stenosis. Right greater than left nephrolithiasis but no obstructing calculus is identified. No strong evidence of pyelonephritis, although IV contrast would be necessary for that CT diagnosis. 4. Splenomegaly, which may be secondary to #1 rather than due to chronic liver disease. 5. Aortic Atherosclerosis      Disposition Plan  :    Status is: Observation  Dispo: The patient is from: Home              Anticipated d/c is to: Home              Patient currently is not medically stable to d/c.   Difficult to place patient No   DVT Prophylaxis  :  Lovenox    Lab Results  Component Value Date   PLT 198 05/11/2020    Diet :  Diet Order            Diet NPO time specified Except for: Sips with Meds  Diet effective midnight           Diet Heart Room service appropriate? Yes; Fluid consistency: Thin  Diet effective now                  Inpatient Medications  Scheduled Meds: . busPIRone  15 mg Oral BID  .  enoxaparin (LOVENOX) injection  40 mg Subcutaneous Q24H  . ezetimibe  10 mg Oral Daily  . feeding supplement  237 mL Oral BID BM  . lidocaine  1 patch Transdermal Q24H  . pantoprazole  40 mg Oral Daily  . [START ON 05/12/2020] potassium chloride  10 mEq Oral Daily  . venlafaxine XR  150 mg Oral Q breakfast   Continuous Infusions: . cefTRIAXone (ROCEPHIN)  IV 2 g (05/11/20 0928)  . lactated ringers 75 mL/hr at 05/11/20 0928   PRN Meds:.acetaminophen **OR** acetaminophen, loperamide  Antibiotics  :    Anti-infectives (From admission, onward)   Start     Dose/Rate Route Frequency Ordered Stop   05/07/20 1600  cefTRIAXone (ROCEPHIN) 1 g in sodium chloride 0.9 % 100 mL IVPB  Status:  Discontinued        1 g 200 mL/hr over 30 Minutes Intravenous Every 24 hours 05/06/20 2030 05/07/20 0736   05/07/20 0800  cefTRIAXone (ROCEPHIN) 2 g in sodium chloride 0.9 % 100 mL IVPB        2 g 200 mL/hr over 30 Minutes Intravenous Every 24 hours 05/07/20 0736     05/06/20 1645  cefTRIAXone (ROCEPHIN) 1 g in sodium chloride 0.9 % 100 mL IVPB        1 g 200 mL/hr over 30 Minutes Intravenous  Once 05/06/20 1636 05/06/20 1753       Time Spent in minutes  30   Lala Lund M.D on 05/11/2020 at 12:13 PM  To page go to [email or web address]   [facility] -  Office  [phone]    See all  Orders from today for further details    Objective:   Vitals:   05/10/20 1639 05/10/20 1958 05/10/20 2129 05/11/20 0500  BP: 137/83 (!) 144/87  (!) 150/78  Pulse: 92 100  91  Resp: (!) 22 20  17   Temp: 98.7 F (37.1 C) 100.2 F (37.9 C) 98.1 F (36.7 C) 98.7 F (37.1 C)  TempSrc: Oral Oral Oral Oral  SpO2: 100% 97%  99%  Weight:      Height:        Wt Readings from Last 3 Encounters:  05/08/20 76.2 kg  04/26/20 73.3 kg  02/10/20 73.8 kg     Intake/Output Summary (Last 24 hours) at 05/11/2020 1213 Last data filed at 05/11/2020 0500 Gross per 24 hour  Intake 100 ml  Output 1000 ml  Net -900 ml      Physical Exam  Awake Alert, No new F.N deficits, Normal affect [place].AT,PERRAL Supple Neck,No JVD, No cervical lymphadenopathy appriciated.  Symmetrical Chest wall movement, Good air movement bilaterally, CTAB RRR,No Gallops, Rubs or new Murmurs, No Parasternal Heave +ve B.Sounds, Abd Soft, No tenderness, No organomegaly appriciated, No rebound - guarding or rigidity. No Cyanosis, Clubbing or edema, No new Rash or bruise    Data Review:    CBC Recent Labs  Lab 05/06/20 1518 05/07/20 0139 05/08/20 0221 05/09/20 0244 05/10/20 0337 05/11/20 0302  WBC 41.0* 29.6* 26.8* 26.6* 26.0* 27.9*  HGB 10.0* 8.6* 8.8* 8.4* 9.1* 9.2*  HCT 32.6* 26.9* 26.6* 25.7* 28.8* 29.0*  PLT 214 192 195 191 198 198  MCV 104.8* 102.7* 100.4* 101.6* 102.9* 101.4*  MCH 32.2 32.8 33.2 33.2 32.5 32.2  MCHC 30.7 32.0 33.1 32.7 31.6 31.7  RDW 15.5 15.2 15.1 15.0 14.8 15.0  LYMPHSABS 36.4*  --  23.5* 18.6* 23.1* 22.9*  MONOABS 0.5  --  0.4 0.3 0.3 0.0*  EOSABS 0.3  --  0.3 1.1* 0.4 0.6*  BASOSABS 0.0  --  0.1 0.3* 0.1 0.0    Recent Labs  Lab 05/06/20 1518 05/06/20 2241 05/07/20 0139 05/07/20 0223 05/08/20 0221 05/09/20 0244 05/10/20 0337 05/11/20 0302  NA 137  --  135  --  139 138 139 137  K 4.6  --  4.3  --  4.4 4.0 3.7 3.4*  CL 107  --  107  --  108 106 106 102  CO2 20*  --  21*  --  23 24 25 27   GLUCOSE 125*  --  129*  --  113* 108* 102* 95  BUN 31*  --  28*  --  17 15 10 11   CREATININE 1.41*  --  1.29*  --  1.13* 1.11* 1.09* 1.23*  CALCIUM 8.4*  --  8.3*  --  8.7* 8.5* 8.8* 8.5*  AST 25  --   --   --  16 14* 17 16  ALT 24  --   --   --  18 16 14 15   ALKPHOS 171*  --   --   --  127* 106 114 99  BILITOT 0.4  --   --   --  0.5 0.4 0.4 0.2*  ALBUMIN 3.7  --   --   --  2.7* 2.5* 2.6* 2.7*  MG  --   --   --  1.9 1.7 1.7 1.7 1.6*  PROCALCITON  --   --   --  0.29 0.22 0.14  --   --   LATICACIDVEN 1.1 1.1  --   --   --   --   --   --  BNP  --   --   --  193.1* 252.0* 135.8* 257.5* 115.3*     ------------------------------------------------------------------------------------------------------------------ No results for input(s): CHOL, HDL, LDLCALC, TRIG, CHOLHDL, LDLDIRECT in the last 72 hours.  No results found for: HGBA1C ------------------------------------------------------------------------------------------------------------------ No results for input(s): TSH, T4TOTAL, T3FREE, THYROIDAB in the last 72 hours.  Invalid input(s): FREET3  Cardiac Enzymes No results for input(s): CKMB, TROPONINI, MYOGLOBIN in the last 168 hours.  Invalid input(s): CK ------------------------------------------------------------------------------------------------------------------    Component Value Date/Time   BNP 115.3 (H) 05/11/2020 0302    Micro Results Recent Results (from the past 240 hour(s))  Covid-19, Flu A+B ([facility])     Status: None   Collection Time: 05/06/20  2:11 PM   Specimen: Nasopharyngeal   Naso  Result Value Ref Range Status   SARS-CoV-2, NAA Not Detected Not Detected Final    Comment: This nucleic acid amplification test was developed and its performance characteristics determined by [employer]. Nucleic acid amplification tests include RT-PCR and TMA. This test has not been FDA cleared or approved. This test has been authorized by FDA under an Emergency Use Authorization (EUA). This test is only authorized for the duration of time the declaration that circumstances exist justifying the authorization of the emergency use of in vitro diagnostic tests for detection of SARS-CoV-2 virus and/or diagnosis of COVID-19 infection under section 564(b)(1) of the Act, 21 U.S.C. 263FHL-4(T) (1), unless the authorization is terminated or revoked sooner. When diagnostic testing is negative, the possibility of a false negative result should be considered in the context of a patient's recent exposures and the presence of clinical signs and symptoms consistent  with COVID-19. An individual without symptoms of COVID-19 and who is not shedding SARS-CoV-2 virus wo uld expect to have a negative (not detected) result in this assay.    Influenza A, NAA Not Detected Not Detected Final   Influenza B, NAA Not Detected Not Detected Final  Resp Panel by RT-PCR (Flu A&B, Covid) Nasopharyngeal Swab     Status: None   Collection Time: 05/06/20  3:18 PM   Specimen: Nasopharyngeal Swab; Nasopharyngeal(NP) swabs in vial transport medium  Result Value Ref Range Status   SARS Coronavirus 2 by RT PCR NEGATIVE NEGATIVE Final    Comment: (NOTE) SARS-CoV-2 target nucleic acids are NOT DETECTED.  The SARS-CoV-2 RNA is generally detectable in upper respiratory specimens during the acute phase of infection. The lowest concentration of SARS-CoV-2 viral copies this assay can detect is 138 copies/mL. A negative result does not preclude SARS-Cov-2 infection and should not be used as the sole basis for treatment or other patient management decisions. A negative result may occur with  improper specimen collection/handling, submission of specimen other than nasopharyngeal swab, presence of viral mutation(s) within the areas targeted by this assay, and inadequate number of viral copies(<138 copies/mL). A negative result must be combined with clinical observations, patient history, and epidemiological information. The expected result is Negative.  Fact Sheet for Patients:  EntrepreneurPulse.com.au  Fact Sheet for Healthcare Providers:  IncredibleEmployment.be  This test is no t yet approved or cleared by the [place] FDA and  has been authorized for detection and/or diagnosis of SARS-CoV-2 by FDA under an Emergency Use Authorization (EUA). This EUA will remain  in effect (meaning this test can be used) for the duration of the COVID-19 declaration under Section 564(b)(1) of the Act, 21 U.S.C.section 360bbb-3(b)(1), unless the  authorization is terminated  or revoked sooner.       Influenza A  by PCR NEGATIVE NEGATIVE Final   Influenza B by PCR NEGATIVE NEGATIVE Final    Comment: (NOTE) The Xpert Xpress SARS-CoV-2/FLU/RSV plus assay is intended as an aid in the diagnosis of influenza from Nasopharyngeal swab specimens and should not be used as a sole basis for treatment. Nasal washings and aspirates are unacceptable for Xpert Xpress SARS-CoV-2/FLU/RSV testing.  Fact Sheet for Patients: EntrepreneurPulse.com.au  Fact Sheet for Healthcare Providers: IncredibleEmployment.be  This test is not yet approved or cleared by the [place] FDA and has been authorized for detection and/or diagnosis of SARS-CoV-2 by FDA under an Emergency Use Authorization (EUA). This EUA will remain in effect (meaning this test can be used) for the duration of the COVID-19 declaration under Section 564(b)(1) of the Act, 21 U.S.C. section 360bbb-3(b)(1), unless the authorization is terminated or revoked.  Performed at [facility]   Culture, blood (routine x 2)     Status: None (Preliminary result)   Collection Time: 05/06/20  3:20 PM   Specimen: BLOOD  Result Value Ref Range Status   Specimen Description   Final    BLOOD Performed at [facility]    Special Requests   Final    LEFT ANTECUBITAL Blood Culture adequate volume Performed at [facility]    Culture   Final    NO GROWTH 4 DAYS Performed at [facility], [place] [address], [place], [place] [address]    Report Status PENDING  Incomplete  Culture, blood (routine x 2)     Status: None (Preliminary result)   Collection Time: 05/06/20  4:50 PM   Specimen: BLOOD  Result Value Ref Range Status   Specimen Description   Final    BLOOD Performed at [facility]    Special Requests   Final    RIGHT ANTECUBITAL Blood Culture adequate volume Performed at [facility]    Culture   Final    NO GROWTH 4 DAYS Performed at [facility], [place] [address], [place], [place] [address]    Report Status PENDING  Incomplete    Radiology Reports CT ABDOMEN PELVIS WO CONTRAST  Result Date: 05/08/2020 CLINICAL DATA:  80 year old female with pyelonephritis. EXAM: CT ABDOMEN AND PELVIS WITHOUT CONTRAST TECHNIQUE: Multidetector CT imaging of the abdomen and pelvis was performed following the standard protocol without IV contrast. COMPARISON:  Renal ultrasound yesterday. FINDINGS: Lower chest: Mild cardiomegaly. Tortuous descending thoracic aorta. No pericardial effusion. Minimal lung base atelectasis. Hepatobiliary: Mildly nodular liver contour. Circumscribed round low-density area measuring 12 mm on series 3, image 21 has simple fluid density and is likely a benign cyst. No other discrete liver lesion on this noncontrast exam. Diminutive or absent gallbladder. Pancreas: Negative noncontrast pancreas. Spleen: Splenomegaly. Estimated splenic volume 535 mL (normal splenic volume range 83 - 412 mL). No discrete splenic lesion. Adrenals/Urinary Tract: Negative adrenal glands. Right kidney appears nonobstructed although there is a round 9 mm calculus in the right renal pelvis (series 6, image 82). Right ureter is decompressed. The distal ureter is difficult to delineate but appears to remain decompressed to the to the bladder. There is moderate to severe left hydronephrosis. But the left ureteropelvic junction appears abruptly tapered on coronal image 79. Only a punctate left intrarenal calculus is identified. No left hydroureter is evident. The left UVJ appears normal. Diminutive urinary bladder, with mild bladder wall thickening in the region of sigmoid colon inflammatory stranding. See below. Numerous pelvic phleboliths.  Stomach/Bowel: Retained stool in the rectum with no rectal wall thickening or inflammation identified. However, the upstream sigmoid colon  is thickened and with mild mesenteric stranding as seen on coronal image 68 which extends to the nearby bladder dome. There are diverticula in the region. This inflammation is in a segment of fiber 6 cm. Diverticulosis in the upstream descending colon without active inflammation. Mild retained stool proximal to the sigmoid. Diminutive or absent appendix. Terminal ileum also remarkable for fairly extensive diverticulosis which tracks into the upstream distal small bowel. No dilated small bowel. No free air or free fluid. No other convincing acute bowel inflammation. Stomach and duodenum are decompressed. Vascular/Lymphatic: Aortoiliac calcified atherosclerosis. Tortuous abdominal aorta. Vascular patency is not evaluated in the absence of IV contrast. Additionally, there is nonspecific appearing lymphadenopathy in the small bowel mesentery (coronal images 54-59) with individual lymph nodes up to 13 mm short axis. Associated haziness of the root of the mesentery. Furthermore, there is bulky left side retroperitoneal lymphadenopathy, with individual pararenal nodes up to 23 mm short axis. The lymphadenopathy abates at the pelvic inlet. No inguinal lymphadenopathy. Reproductive: Absent uterus.  Diminutive or absent ovaries. Other: Mild presacral stranding is nonspecific. No pelvic free fluid. Musculoskeletal: Several chronic left posterior rib fractures. Grade 1 degenerative lower lumbar spondylolisthesis with severe facet degeneration. Severe chronic disc degeneration at the lumbosacral junction. No acute osseous abnormality identified. IMPRESSION: 1. Moderate to severe retroperitoneal and mesenteric lymphadenopathy suspicious for Lymphoma, Leukemia, or less likely other lymphoproliferative disorder. A CT guided left pararenal lymph node needle biopsy should be feasible. 2. Sigmoid colon appears inflamed compatible with acute diverticulitis or colitis. Mild secondary inflammation of the nearby Bladder Dome. No evidence  of colovesical fistula or abscess. Underlying extensive small bowel diverticulosis. But no other bowel inflammation identified on this noncontrast exam. 3. Moderate to severe left hydronephrosis but with abrupt tapering at the left ureteropelvic junction suggesting UPJ stenosis. Right greater than left nephrolithiasis but no obstructing calculus is identified. No strong evidence of pyelonephritis, although IV contrast would be necessary for that CT diagnosis. 4. Splenomegaly, which may be secondary to #1 rather than due to chronic liver disease. 5. Aortic Atherosclerosis (ICD10-I70.0). Electronically Signed   By: Genevie Ann M.D.   On: 05/08/2020 09:16   NM Renal Imaging Flow W/Pharm  Result Date: 05/10/2020 CLINICAL DATA:  Evaluate hydronephrosis of the left kidney. Status post bilobed plasty. EXAM: NUCLEAR MEDICINE RENAL SCAN WITH DIURETIC ADMINISTRATION TECHNIQUE: Radionuclide angiographic and sequential renal images were obtained after intravenous injection of radiopharmaceutical. Imaging was continued during slow intravenous injection of Lasix approximately 15 minutes after the start of the examination. RADIOPHARMACEUTICALS:  5.5 mCi Technetium-86m MAG3 IV COMPARISON:  05/08/2020 FINDINGS: Flow: Normal flow to the right kidney. Markedly diminished in delayed perfusion to the left kidney. Left renogram: Asymmetric delayed cortical uptake, excretion, and clearance of the radiopharmaceutical. On the postvoid images there is persistent radiopharmaceutical accumulation within the dilated left renal collecting system. Right renogram: Normal cortical uptake, excretion in clearance of the radiopharmaceutical. Differential: Left kidney = 23 % Right kidney = 77 % T1/2 post Lasix : Technical difficulties with Lasix administration resulted in the diuretic being administered at 80 minutes with only 10 minutes remaining. IMPRESSION: 1. Normally functioning right kidney. 2. Delayed left renal perfusion, cortical uptake,  excretion and clearance of the radiopharmaceutical consistent with obstructive uropathy. Electronically Signed   By: Kerby Moors M.D.   On: 05/10/2020 14:35   US RENAL  Result Date: 05/07/2020 CLINICAL  DATA:  Pyelonephritis. EXAM: RENAL / URINARY TRACT ULTRASOUND COMPLETE COMPARISON:  None. FINDINGS: Right Kidney: Renal measurements: 11.4 x 5.1 x 5.1 cm = volume: 154.6 mL. Echogenicity within normal limits. No mass or hydronephrosis visualized. Left Kidney: Renal measurements: 10.8 x 5.3 x 6.6 cm = volume: 198.8 mL. Moderate hydronephrosis identified on the left. Bladder: Appears normal for degree of bladder distention. Other: The spleen is enlarged measuring 13.8 x 12.7 x 5.8 cm with a volume of 537 cc. IMPRESSION: 1. Moderate left hydronephrosis of uncertain chronicity. Acute/active obstruction not excluded on this study. Recommend clinical correlation. CT imaging could further evaluate for an underlying cause. 2. Splenomegaly. Electronically Signed   By: Dorise Bullion III M.D   On: 05/07/2020 16:55   DG Chest Portable 1 View  Result Date: 05/06/2020 CLINICAL DATA:  Fever, chills and cough. EXAM: PORTABLE CHEST 1 VIEW COMPARISON:  None. FINDINGS: The lungs are hyperinflated. There is no evidence of acute infiltrate, pleural effusion or pneumothorax. The heart size and mediastinal contours are within normal limits. Multiple chronic left-sided rib fractures are seen. A radiopaque fusion plate and screws are seen within the proximal right humerus. IMPRESSION: No active cardiopulmonary disease. Electronically Signed   By: Virgina Norfolk M.D.   On: 05/06/2020 15:41

## 2020-05-12 ENCOUNTER — Encounter (HOSPITAL_COMMUNITY)
Admission: EM | Disposition: A | Discharge status: Home Health | Payer: Self-pay | Source: Home / Self Care | Attending: Internal Medicine

## 2020-05-12 ENCOUNTER — Inpatient Hospital Stay (HOSPITAL_COMMUNITY): Payer: Medicare Other | Admitting: Anesthesiology

## 2020-05-12 ENCOUNTER — Inpatient Hospital Stay (HOSPITAL_COMMUNITY): Payer: Medicare Other

## 2020-05-12 ENCOUNTER — Encounter (HOSPITAL_COMMUNITY): Payer: Self-pay | Admitting: Internal Medicine

## 2020-05-12 DIAGNOSIS — I959 Hypotension, unspecified: Secondary | ICD-10-CM

## 2020-05-12 HISTORY — PX: CYSTOSCOPY WITH STENT PLACEMENT: SHX5790

## 2020-05-12 LAB — CBC WITH DIFFERENTIAL/PLATELET
Abs Immature Granulocytes: 0 10*3/uL (ref 0.00–0.07)
Basophils Absolute: 0 10*3/uL (ref 0.0–0.1)
Basophils Relative: 0 %
Eosinophils Absolute: 0.8 10*3/uL — ABNORMAL HIGH (ref 0.0–0.5)
Eosinophils Relative: 3 %
HCT: 28.5 % — ABNORMAL LOW (ref 36.0–46.0)
Hemoglobin: 9.2 g/dL — ABNORMAL LOW (ref 12.0–15.0)
Lymphocytes Relative: 78 %
Lymphs Abs: 21.1 10*3/uL — ABNORMAL HIGH (ref 0.7–4.0)
MCH: 33.2 pg (ref 26.0–34.0)
MCHC: 32.3 g/dL (ref 30.0–36.0)
MCV: 102.9 fL — ABNORMAL HIGH (ref 80.0–100.0)
Monocytes Absolute: 0.3 10*3/uL (ref 0.1–1.0)
Monocytes Relative: 1 %
Neutro Abs: 4.9 10*3/uL (ref 1.7–7.7)
Neutrophils Relative %: 18 %
Platelets: 189 10*3/uL (ref 150–400)
RBC: 2.77 MIL/uL — ABNORMAL LOW (ref 3.87–5.11)
RDW: 15.2 % (ref 11.5–15.5)
WBC: 27.1 10*3/uL — ABNORMAL HIGH (ref 4.0–10.5)
nRBC: 0 % (ref 0.0–0.2)
nRBC: 0 /100 WBC

## 2020-05-12 LAB — COMPREHENSIVE METABOLIC PANEL
ALT: 14 U/L (ref 0–44)
AST: 19 U/L (ref 15–41)
Albumin: 2.7 g/dL — ABNORMAL LOW (ref 3.5–5.0)
Alkaline Phosphatase: 117 U/L (ref 38–126)
Anion gap: 8 (ref 5–15)
BUN: 13 mg/dL (ref 8–23)
CO2: 26 mmol/L (ref 22–32)
Calcium: 8.5 mg/dL — ABNORMAL LOW (ref 8.9–10.3)
Chloride: 103 mmol/L (ref 98–111)
Creatinine, Ser: 1.19 mg/dL — ABNORMAL HIGH (ref 0.44–1.00)
GFR, Estimated: 46 mL/min — ABNORMAL LOW (ref >60)
Glucose, Bld: 101 mg/dL — ABNORMAL HIGH (ref 70–99)
Potassium: 3.9 mmol/L (ref 3.5–5.1)
Sodium: 137 mmol/L (ref 135–145)
Total Bilirubin: 0.5 mg/dL (ref 0.3–1.2)
Total Protein: 5 g/dL — ABNORMAL LOW (ref 6.5–8.1)

## 2020-05-12 LAB — BRAIN NATRIURETIC PEPTIDE: B Natriuretic Peptide: 80 pg/mL (ref 0.0–100.0)

## 2020-05-12 LAB — CULTURE, BLOOD (ROUTINE X 2)
Culture: NO GROWTH
Culture: NO GROWTH
Special Requests: ADEQUATE
Special Requests: ADEQUATE

## 2020-05-12 SURGERY — CYSTOSCOPY, WITH STENT INSERTION
Anesthesia: General | Site: Ureter | Laterality: Left

## 2020-05-12 MED ORDER — PROPOFOL 10 MG/ML IV BOLUS
INTRAVENOUS | Status: DC | PRN
  Administered 2020-05-12: 100 mg via INTRAVENOUS

## 2020-05-12 MED ORDER — WATER FOR IRRIGATION, STERILE IR SOLN
Status: DC | PRN
  Administered 2020-05-12: 1000 mL

## 2020-05-12 MED ORDER — PHENYLEPHRINE 40 MCG/ML (10ML) SYRINGE FOR IV PUSH (FOR BLOOD PRESSURE SUPPORT)
PREFILLED_SYRINGE | INTRAVENOUS | Status: AC
  Filled 2020-05-12: qty 10

## 2020-05-12 MED ORDER — 0.9 % SODIUM CHLORIDE (POUR BTL) OPTIME
TOPICAL | Status: DC | PRN
  Administered 2020-05-12: 1000 mL

## 2020-05-12 MED ORDER — ACETAMINOPHEN 500 MG PO TABS
1000.0000 mg | ORAL_TABLET | Freq: Once | ORAL | Status: AC
  Administered 2020-05-12: 1000 mg via ORAL
  Filled 2020-05-12: qty 2

## 2020-05-12 MED ORDER — CHLORHEXIDINE GLUCONATE 0.12 % MT SOLN
15.0000 mL | OROMUCOSAL | Status: AC
  Filled 2020-05-12: qty 15

## 2020-05-12 MED ORDER — LACTATED RINGERS IV SOLN: INTRAVENOUS | Status: DC | PRN

## 2020-05-12 MED ORDER — ONDANSETRON HCL 4 MG/2ML IJ SOLN
INTRAMUSCULAR | Status: AC
  Filled 2020-05-12: qty 4

## 2020-05-12 MED ORDER — DEXAMETHASONE SODIUM PHOSPHATE 10 MG/ML IJ SOLN
INTRAMUSCULAR | Status: AC
  Filled 2020-05-12: qty 2

## 2020-05-12 MED ORDER — LACTATED RINGERS IV SOLN: INTRAVENOUS | Status: DC

## 2020-05-12 MED ORDER — CHLORHEXIDINE GLUCONATE 0.12 % MT SOLN
OROMUCOSAL | Status: AC
  Administered 2020-05-12: 15 mL via OROMUCOSAL
  Filled 2020-05-12: qty 15

## 2020-05-12 MED ORDER — PROPOFOL 10 MG/ML IV BOLUS
INTRAVENOUS | Status: AC
  Filled 2020-05-12: qty 20

## 2020-05-12 MED ORDER — FENTANYL CITRATE (PF) 100 MCG/2ML IJ SOLN
INTRAMUSCULAR | Status: DC | PRN
  Administered 2020-05-12: 100 ug via INTRAVENOUS

## 2020-05-12 MED ORDER — PHENYLEPHRINE HCL (PRESSORS) 10 MG/ML IV SOLN
INTRAVENOUS | Status: DC | PRN
  Administered 2020-05-12: 100 ug via INTRAVENOUS

## 2020-05-12 MED ORDER — FENTANYL CITRATE (PF) 250 MCG/5ML IJ SOLN
INTRAMUSCULAR | Status: AC
  Filled 2020-05-12: qty 5

## 2020-05-12 MED ORDER — ONDANSETRON HCL 4 MG/2ML IJ SOLN
INTRAMUSCULAR | Status: DC | PRN
  Administered 2020-05-12: 4 mg via INTRAVENOUS

## 2020-05-12 MED ORDER — IOHEXOL 300 MG/ML  SOLN
INTRAMUSCULAR | Status: DC | PRN
  Administered 2020-05-12: 50 mL

## 2020-05-12 MED ORDER — LIDOCAINE 2% (20 MG/ML) 5 ML SYRINGE
INTRAMUSCULAR | Status: DC | PRN
  Administered 2020-05-12: 30 mg via INTRAVENOUS

## 2020-05-12 MED ORDER — FENTANYL CITRATE (PF) 100 MCG/2ML IJ SOLN: 25.0000 ug | INTRAMUSCULAR | Status: DC | PRN

## 2020-05-12 MED ORDER — LIDOCAINE 2% (20 MG/ML) 5 ML SYRINGE
INTRAMUSCULAR | Status: AC
  Filled 2020-05-12: qty 5

## 2020-05-12 SURGICAL SUPPLY — 23 items
BAG URINE DRAIN 2000ML AR STRL (UROLOGICAL SUPPLIES) ×3 IMPLANT
BAG URO CATCHER STRL LF (MISCELLANEOUS) ×3 IMPLANT
CATH FOLEY 2WAY SLVR  5CC 16FR (CATHETERS)
CATH FOLEY 2WAY SLVR 5CC 16FR (CATHETERS) IMPLANT
CATH INTERMIT  6FR 70CM (CATHETERS) ×3 IMPLANT
GLOVE BIO SURGEON STRL SZ7.5 (GLOVE) ×3 IMPLANT
GOWN STRL REUS W/ TWL LRG LVL3 (GOWN DISPOSABLE) ×1 IMPLANT
GOWN STRL REUS W/ TWL XL LVL3 (GOWN DISPOSABLE) ×1 IMPLANT
GOWN STRL REUS W/TWL LRG LVL3 (GOWN DISPOSABLE) ×2
GOWN STRL REUS W/TWL XL LVL3 (GOWN DISPOSABLE) ×2
GUIDEWIRE ANG ZIPWIRE 038X150 (WIRE) IMPLANT
GUIDEWIRE STR DUAL SENSOR (WIRE) ×3 IMPLANT
KIT TURNOVER KIT B (KITS) ×3 IMPLANT
MANIFOLD NEPTUNE II (INSTRUMENTS) ×3 IMPLANT
NS IRRIG 1000ML POUR BTL (IV SOLUTION) ×3 IMPLANT
PACK CYSTO (CUSTOM PROCEDURE TRAY) ×3 IMPLANT
STENT URET 6FRX24 CONTOUR (STENTS) ×2 IMPLANT
STENT URET 6FRX26 CONTOUR (STENTS) IMPLANT
SYPHON OMNI JUG (MISCELLANEOUS) ×3 IMPLANT
TOWEL GREEN STERILE FF (TOWEL DISPOSABLE) ×3 IMPLANT
TUBE CONNECTING 12'X1/4 (SUCTIONS)
TUBE CONNECTING 12X1/4 (SUCTIONS) IMPLANT
WATER STERILE IRR 3000ML UROMA (IV SOLUTION) ×3 IMPLANT

## 2020-05-12 NOTE — Anesthesia Postprocedure Evaluation (Signed)
Anesthesia Post Note  Patient: Sarah [name]  Procedure(s) Performed: CYSTOSCOPY WITH STENT PLACEMENT (Left Ureter)     Patient location during evaluation: PACU Anesthesia Type: General Level of consciousness: sedated Pain management: pain level controlled Vital Signs Assessment: post-procedure vital signs reviewed and stable Respiratory status: spontaneous breathing and respiratory function stable Cardiovascular status: stable Postop Assessment: no apparent nausea or vomiting Anesthetic complications: no   No complications documented.  Last Vitals:  Vitals:   05/12/20 1410 05/12/20 1422  BP: (!) 108/54 (!) 115/58  Pulse: 77 77  Resp: 16 11  Temp: (!) 36.4 C (!) 36.4 C  SpO2: 99% 98%    Last Pain:  Vitals:   05/12/20 1422  TempSrc:   PainSc: Asleep                 Berlyn Malina DANIEL

## 2020-05-12 NOTE — Anesthesia Preprocedure Evaluation (Addendum)
Anesthesia Evaluation  Patient identified by MRN, date of birth, ID band Patient awake    Reviewed: Allergy & Precautions, NPO status , Patient's Chart, lab work & pertinent test results, reviewed documented beta blocker date and time   History of Anesthesia Complications Negative for: history of anesthetic complications  Airway Mallampati: II  TM Distance: >3 FB Neck ROM: Full    Dental  (+) Dental Advisory Given   Pulmonary neg pulmonary ROS,    Pulmonary exam normal        Cardiovascular hypertension, Pt. on medications and Pt. on home beta blockers Normal cardiovascular exam     Neuro/Psych negative neurological ROS     GI/Hepatic Neg liver ROS, GERD  ,  Endo/Other  negative endocrine ROS  Renal/GU Renal InsufficiencyRenal disease     Musculoskeletal negative musculoskeletal ROS (+)   Abdominal   Peds  Hematology negative hematology ROS (+) anemia , CLL   Anesthesia Other Findings   Reproductive/Obstetrics                            Anesthesia Physical Anesthesia Plan  ASA: III  Anesthesia Plan: General   Post-op Pain Management:    Induction: Intravenous  PONV Risk Score and Plan: 4 or greater and Ondansetron, Dexamethasone, Diphenhydramine and Treatment may vary due to age or medical condition  Airway Management Planned: LMA  Additional Equipment:   Intra-op Plan:   Post-operative Plan: Extubation in OR  Informed Consent:   Patient has DNR.  Discussed DNR with patient and Suspend DNR.     Plan Discussed with: Anesthesiologist  Anesthesia Plan Comments: (Discussed DNR, no chest compressions intraop, Intubation OK.)       Anesthesia Quick Evaluation

## 2020-05-12 NOTE — Transfer of Care (Signed)
Immediate Anesthesia Transfer of Care Note  Patient: Sarah [name]  Procedure(s) Performed: CYSTOSCOPY WITH STENT PLACEMENT (Left Ureter)  Patient Location: PACU  Anesthesia Type:General  Level of Consciousness: awake, alert  and oriented  Airway & Oxygen Therapy: Patient Spontanous Breathing  Post-op Assessment: Report given to RN and Post -op Vital signs reviewed and stable  Post vital signs: Reviewed and stable  Last Vitals:  Vitals Value Taken Time  BP 124/70 05/12/20 1339  Temp    Pulse 82 05/12/20 1340  Resp 15 05/12/20 1340  SpO2 92 % 05/12/20 1340  Vitals shown include unvalidated device data.  Last Pain:  Vitals:   05/12/20 0736  TempSrc: Oral  PainSc: 0-No pain      Patients Stated Pain Goal: 0 (37/29/02 1115)  Complications: No complications documented.

## 2020-05-12 NOTE — Anesthesia Procedure Notes (Signed)
Procedure Name: LMA Insertion Date/Time: 05/12/2020 1:03 PM Performed by: Eligha Bridegroom, CRNA Pre-anesthesia Checklist: Patient identified, Emergency Drugs available, Suction available, Patient being monitored and Timeout performed Patient Re-evaluated:Patient Re-evaluated prior to induction Oxygen Delivery Method: Circle system utilized Preoxygenation: Pre-oxygenation with 100% oxygen Induction Type: IV induction LMA: LMA inserted LMA Size: 4.0 Number of attempts: 1 Placement Confirmation: positive ETCO2 and breath sounds checked- equal and bilateral Tube secured with: Tape Dental Injury: Teeth and Oropharynx as per pre-operative assessment

## 2020-05-12 NOTE — Progress Notes (Signed)
PROGRESS NOTE                                                                                                                                                                                                             Patient Demographics:    Sarah [name], is a 80 y.o. female, DOB - 1940-08-19, CHY:[id number]  Outpatient Primary MD for the patient is Libby Maw, MD    LOS - 5  Admit date - 05/06/2020    Chief Complaint  Patient presents with  . URI       Brief Narrative (HPI from H&P)   Sarah [name] is a 80 y.o. 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   Subjective:   Patient in bed, denies any complaints, no chest pain, no fever, no chills.      Assessment  & Plan :    1. 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    2. AKI and Hypotension -improved with IV fluids, improved with IV antibiotics  3. CLL - post DC follow with Dr Lorenso Courier.  4. Dyslipidemia - on Zetia.  5.GERD - PPI.   6. Depression - home Rx.  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  8. HTN - placed on Norvasc along with as needed IV hydralazine.      Condition - Extremely  Guarded  Family Communication  : None at bedside  Code Status :  DNR  Consults  : Urology  PUD Prophylaxis : None   Procedures  :     Renal Nuclear Scan - obstructive uropathy  Renal US - L . Hydronephrosis  CT - 1. Moderate to severe retroperitoneal and mesenteric lymphadenopathy suspicious for Lymphoma, Leukemia,  or less likely other lymphoproliferative disorder. A CT guided left pararenal lymph node needle biopsy should be feasible. 2. Sigmoid colon appears inflamed compatible with acute diverticulitis or colitis. Mild secondary inflammation of the nearby Bladder Dome. No evidence of colovesical fistula or abscess. Underlying extensive small bowel diverticulosis. But no other bowel inflammation identified on this noncontrast exam. 3. Moderate to severe left hydronephrosis but with abrupt tapering at the left ureteropelvic junction suggesting UPJ stenosis. Right greater than left nephrolithiasis but no obstructing calculus is identified. No strong evidence of pyelonephritis, although IV contrast would be necessary for that CT diagnosis. 4. Splenomegaly, which may be secondary to #1 rather than due to chronic liver disease. 5. Aortic Atherosclerosis      Disposition Plan  :    Status is: Observation  Dispo: The patient is from: Home              Anticipated d/c is to: Home              Patient currently is not medically stable to d/c.   Difficult to place patient No   DVT Prophylaxis  :  Lovenox    Lab Results  Component Value Date   PLT 189 05/12/2020    Diet :  Diet Order            Diet NPO time specified Except for: Sips with Meds  Diet effective midnight                  Inpatient Medications  Scheduled Meds: . [MAR Hold] amLODipine  10 mg Oral Daily  . [MAR Hold] busPIRone  15 mg Oral BID  . [MAR Hold] enoxaparin (LOVENOX) injection  40 mg Subcutaneous Q24H  . [MAR Hold] ezetimibe  10 mg Oral Daily  . [MAR Hold] feeding supplement  237 mL Oral BID BM  .  [MAR Hold] lidocaine  1 patch Transdermal Q24H  . [MAR Hold] pantoprazole  40 mg Oral Daily  . [MAR Hold] potassium chloride  10 mEq Oral Daily  . [MAR Hold] venlafaxine XR  150 mg Oral Q breakfast   Continuous Infusions: . [MAR Hold] cefTRIAXone (ROCEPHIN)  IV 2 g (05/12/20 1112)  . lactated ringers 10 mL/hr at 05/12/20 1155   PRN Meds:.[MAR Hold] acetaminophen **OR** [MAR Hold] acetaminophen, fentaNYL (SUBLIMAZE) injection, [MAR Hold] hydrALAZINE, [MAR Hold] loperamide  Antibiotics  :    Anti-infectives (From admission, onward)   Start     Dose/Rate Route Frequency Ordered Stop   05/07/20 1600  cefTRIAXone (ROCEPHIN) 1 g in sodium chloride 0.9 % 100 mL IVPB  Status:  Discontinued        1 g 200 mL/hr over 30 Minutes Intravenous Every 24 hours 05/06/20 2030 05/07/20 0736   05/07/20 0800  [MAR Hold]  cefTRIAXone (ROCEPHIN) 2 g in sodium chloride 0.9 % 100 mL IVPB        (MAR Hold since Wed 05/12/2020 at 1142.Hold Reason: Transfer to a Procedural area.)   2 g 200 mL/hr over 30 Minutes Intravenous Every 24 hours 05/07/20 0736     05/06/20 1645  cefTRIAXone (ROCEPHIN) 1 g in sodium chloride 0.9 % 100 mL IVPB        1 g 200 mL/hr over 30 Minutes Intravenous  Once 05/06/20 1636 05/06/20 1753        Lelend Heinecke M.D on 05/12/2020 at 2:16 PM  To page go to [email or web address]   [facility] -  Office  [phone]    See  all Orders from today for further details    Objective:   Vitals:   05/12/20 0736 05/12/20 1147 05/12/20 1337 05/12/20 1352  BP: 97/68  124/70 110/71  Pulse: 90  81 82  Resp: 20  15 17   Temp: 98 F (36.7 C)  (!) 97.5 F (36.4 C)   TempSrc: Oral     SpO2: 96%  97% 94%  Weight:  76.2 kg    Height:  5\' 3"  (1.6 m)      Wt Readings from Last 3 Encounters:  05/12/20 76.2 kg  04/26/20 73.3 kg  02/10/20 73.8 kg     Intake/Output Summary (Last 24 hours) at 05/12/2020 1416 Last data filed at 05/12/2020 1335 Gross per 24 hour  Intake 1000 ml  Output  400 ml  Net 600 ml     Physical Exam  Awake Alert, Oriented X 3, No new F.N deficits, Normal affect Symmetrical Chest wall movement, Good air movement bilaterally, CTAB RRR,No Gallops,Rubs or new Murmurs, No Parasternal Heave +ve B.Sounds, Abd Soft, No tenderness, No rebound - guarding or rigidity. No Cyanosis, Clubbing or edema, No new Rash or bruise      Data Review:    CBC Recent Labs  Lab 05/08/20 0221 05/09/20 0244 05/10/20 0337 05/11/20 0302 05/12/20 0224  WBC 26.8* 26.6* 26.0* 27.9* 27.1*  HGB 8.8* 8.4* 9.1* 9.2* 9.2*  HCT 26.6* 25.7* 28.8* 29.0* 28.5*  PLT 195 191 198 198 189  MCV 100.4* 101.6* 102.9* 101.4* 102.9*  MCH 33.2 33.2 32.5 32.2 33.2  MCHC 33.1 32.7 31.6 31.7 32.3  RDW 15.1 15.0 14.8 15.0 15.2  LYMPHSABS 23.5* 18.6* 23.1* 22.9* 21.1*  MONOABS 0.4 0.3 0.3 0.0* 0.3  EOSABS 0.3 1.1* 0.4 0.6* 0.8*  BASOSABS 0.1 0.3* 0.1 0.0 0.0    Recent Labs  Lab 05/06/20 1518 05/06/20 2241 05/07/20 0139 05/07/20 0223 05/08/20 0221 05/09/20 0244 05/10/20 0337 05/11/20 0302 05/12/20 0224  NA 137  --    < >  --  139 138 139 137 137  K 4.6  --    < >  --  4.4 4.0 3.7 3.4* 3.9  CL 107  --    < >  --  108 106 106 102 103  CO2 20*  --    < >  --  23 24 25 27 26   GLUCOSE 125*  --    < >  --  113* 108* 102* 95 101*  BUN 31*  --    < >  --  17 15 10 11 13   CREATININE 1.41*  --    < >  --  1.13* 1.11* 1.09* 1.23* 1.19*  CALCIUM 8.4*  --    < >  --  8.7* 8.5* 8.8* 8.5* 8.5*  AST 25  --   --   --  16 14* 17 16 19   ALT 24  --   --   --  18 16 14 15 14   ALKPHOS 171*  --   --   --  127* 106 114 99 117  BILITOT 0.4  --   --   --  0.5 0.4 0.4 0.2* 0.5  ALBUMIN 3.7  --   --   --  2.7* 2.5* 2.6* 2.7* 2.7*  MG  --   --   --  1.9 1.7 1.7 1.7 1.6*  --   PROCALCITON  --   --   --  0.29 0.22 0.14  --   --   --  LATICACIDVEN 1.1 1.1  --   --   --   --   --   --   --   BNP  --   --    < > 193.1* 252.0* 135.8* 257.5* 115.3* 80.0   < > = values in this interval not displayed.     ------------------------------------------------------------------------------------------------------------------ No results for input(s): CHOL, HDL, LDLCALC, TRIG, CHOLHDL, LDLDIRECT in the last 72 hours.  No results found for: HGBA1C ------------------------------------------------------------------------------------------------------------------ No results for input(s): TSH, T4TOTAL, T3FREE, THYROIDAB in the last 72 hours.  Invalid input(s): FREET3  Cardiac Enzymes No results for input(s): CKMB, TROPONINI, MYOGLOBIN in the last 168 hours.  Invalid input(s): CK ------------------------------------------------------------------------------------------------------------------    Component Value Date/Time   BNP 80.0 05/12/2020 0224    Micro Results Recent Results (from the past 240 hour(s))  Covid-19, Flu A+B ([facility])     Status: None   Collection Time: 05/06/20  2:11 PM   Specimen: Nasopharyngeal   Naso  Result Value Ref Range Status   SARS-CoV-2, NAA Not Detected Not Detected Final    Comment: This nucleic acid amplification test was developed and its performance characteristics determined by [employer]. Nucleic acid amplification tests include RT-PCR and TMA. This test has not been FDA cleared or approved. This test has been authorized by FDA under an Emergency Use Authorization (EUA). This test is only authorized for the duration of time the declaration that circumstances exist justifying the authorization of the emergency use of in vitro diagnostic tests for detection of SARS-CoV-2 virus and/or diagnosis of COVID-19 infection under section 564(b)(1) of the Act, 21 U.S.C. 867EHM-0(N) (1), unless the authorization is terminated or revoked sooner. When diagnostic testing is negative, the possibility of a false negative result should be considered in the context of a patient's recent exposures and the presence of clinical signs and symptoms consistent with  COVID-19. An individual without symptoms of COVID-19 and who is not shedding SARS-CoV-2 virus wo uld expect to have a negative (not detected) result in this assay.    Influenza A, NAA Not Detected Not Detected Final   Influenza B, NAA Not Detected Not Detected Final  Resp Panel by RT-PCR (Flu A&B, Covid) Nasopharyngeal Swab     Status: None   Collection Time: 05/06/20  3:18 PM   Specimen: Nasopharyngeal Swab; Nasopharyngeal(NP) swabs in vial transport medium  Result Value Ref Range Status   SARS Coronavirus 2 by RT PCR NEGATIVE NEGATIVE Final    Comment: (NOTE) SARS-CoV-2 target nucleic acids are NOT DETECTED.  The SARS-CoV-2 RNA is generally detectable in upper respiratory specimens during the acute phase of infection. The lowest concentration of SARS-CoV-2 viral copies this assay can detect is 138 copies/mL. A negative result does not preclude SARS-Cov-2 infection and should not be used as the sole basis for treatment or other patient management decisions. A negative result may occur with  improper specimen collection/handling, submission of specimen other than nasopharyngeal swab, presence of viral mutation(s) within the areas targeted by this assay, and inadequate number of viral copies(<138 copies/mL). A negative result must be combined with clinical observations, patient history, and epidemiological information. The expected result is Negative.  Fact Sheet for Patients:  EntrepreneurPulse.com.au  Fact Sheet for Healthcare Providers:  IncredibleEmployment.be  This test is no t yet approved or cleared by the [place] FDA and  has been authorized for detection and/or diagnosis of SARS-CoV-2 by FDA under an Emergency Use Authorization (EUA). This EUA will remain  in effect (meaning this test  can be used) for the duration of the COVID-19 declaration under Section 564(b)(1) of the Act, 21 U.S.C.section 360bbb-3(b)(1), unless the  authorization is terminated  or revoked sooner.       Influenza A by PCR NEGATIVE NEGATIVE Final   Influenza B by PCR NEGATIVE NEGATIVE Final    Comment: (NOTE) The Xpert Xpress SARS-CoV-2/FLU/RSV plus assay is intended as an aid in the diagnosis of influenza from Nasopharyngeal swab specimens and should not be used as a sole basis for treatment. Nasal washings and aspirates are unacceptable for Xpert Xpress SARS-CoV-2/FLU/RSV testing.  Fact Sheet for Patients: EntrepreneurPulse.com.au  Fact Sheet for Healthcare Providers: IncredibleEmployment.be  This test is not yet approved or cleared by the [place] FDA and has been authorized for detection and/or diagnosis of SARS-CoV-2 by FDA under an Emergency Use Authorization (EUA). This EUA will remain in effect (meaning this test can be used) for the duration of the COVID-19 declaration under Section 564(b)(1) of the Act, 21 U.S.C. section 360bbb-3(b)(1), unless the authorization is terminated or revoked.  Performed at [facility]   Culture, blood (routine x 2)     Status: None   Collection Time: 05/06/20  3:20 PM   Specimen: BLOOD  Result Value Ref Range Status   Specimen Description   Final    BLOOD Performed at [facility]    Special Requests   Final    LEFT ANTECUBITAL Blood Culture adequate volume Performed at [facility]    Culture   Final    NO GROWTH 5 DAYS Performed at [facility], [place] [address], [place], [place] [address]    Report Status 05/12/2020 FINAL  Final  Culture, blood (routine x 2)     Status: None   Collection Time: 05/06/20  4:50 PM   Specimen: BLOOD  Result Value Ref Range Status   Specimen Description   Final    BLOOD Performed at [facility]    Special Requests   Final    RIGHT ANTECUBITAL Blood Culture adequate volume Performed at [facility]    Culture    Final    NO GROWTH 5 DAYS Performed at [facility], [place] [address] [place], [place] [address]    Report Status 05/12/2020 FINAL  Final    Radiology Reports CT ABDOMEN PELVIS WO CONTRAST  Result Date: 05/08/2020 CLINICAL DATA:  80 year old female with pyelonephritis. EXAM: CT ABDOMEN AND PELVIS WITHOUT CONTRAST TECHNIQUE: Multidetector CT imaging of the abdomen and pelvis was performed following the standard protocol without IV contrast. COMPARISON:  Renal ultrasound yesterday. FINDINGS: Lower chest: Mild cardiomegaly. Tortuous descending thoracic aorta. No pericardial effusion. Minimal lung base atelectasis. Hepatobiliary: Mildly nodular liver contour. Circumscribed round low-density area measuring 12 mm on series 3, image 21 has simple fluid density and is likely a benign cyst. No other discrete liver lesion on this noncontrast exam. Diminutive or absent gallbladder. Pancreas: Negative noncontrast pancreas. Spleen: Splenomegaly. Estimated splenic volume 535 mL (normal splenic volume range 83 - 412 mL). No discrete splenic lesion. Adrenals/Urinary Tract: Negative adrenal glands. Right kidney appears nonobstructed although there is a round 9 mm calculus in the right renal pelvis (series 6, image 82). Right ureter is decompressed. The distal ureter is difficult to delineate but appears to remain decompressed to the to the bladder. There is moderate to severe left hydronephrosis. But the left ureteropelvic junction appears abruptly tapered on coronal image 79. Only a punctate left  intrarenal calculus is identified. No left hydroureter is evident. The left UVJ appears normal. Diminutive urinary bladder, with mild bladder wall thickening in the region of sigmoid colon inflammatory stranding. See below. Numerous pelvic phleboliths. Stomach/Bowel: Retained stool in the rectum with no rectal wall thickening or inflammation identified. However, the upstream sigmoid colon is thickened and with mild  mesenteric stranding as seen on coronal image 68 which extends to the nearby bladder dome. There are diverticula in the region. This inflammation is in a segment of fiber 6 cm. Diverticulosis in the upstream descending colon without active inflammation. Mild retained stool proximal to the sigmoid. Diminutive or absent appendix. Terminal ileum also remarkable for fairly extensive diverticulosis which tracks into the upstream distal small bowel. No dilated small bowel. No free air or free fluid. No other convincing acute bowel inflammation. Stomach and duodenum are decompressed. Vascular/Lymphatic: Aortoiliac calcified atherosclerosis. Tortuous abdominal aorta. Vascular patency is not evaluated in the absence of IV contrast. Additionally, there is nonspecific appearing lymphadenopathy in the small bowel mesentery (coronal images 54-59) with individual lymph nodes up to 13 mm short axis. Associated haziness of the root of the mesentery. Furthermore, there is bulky left side retroperitoneal lymphadenopathy, with individual pararenal nodes up to 23 mm short axis. The lymphadenopathy abates at the pelvic inlet. No inguinal lymphadenopathy. Reproductive: Absent uterus.  Diminutive or absent ovaries. Other: Mild presacral stranding is nonspecific. No pelvic free fluid. Musculoskeletal: Several chronic left posterior rib fractures. Grade 1 degenerative lower lumbar spondylolisthesis with severe facet degeneration. Severe chronic disc degeneration at the lumbosacral junction. No acute osseous abnormality identified. IMPRESSION: 1. Moderate to severe retroperitoneal and mesenteric lymphadenopathy suspicious for Lymphoma, Leukemia, or less likely other lymphoproliferative disorder. A CT guided left pararenal lymph node needle biopsy should be feasible. 2. Sigmoid colon appears inflamed compatible with acute diverticulitis or colitis. Mild secondary inflammation of the nearby Bladder Dome. No evidence of colovesical fistula or  abscess. Underlying extensive small bowel diverticulosis. But no other bowel inflammation identified on this noncontrast exam. 3. Moderate to severe left hydronephrosis but with abrupt tapering at the left ureteropelvic junction suggesting UPJ stenosis. Right greater than left nephrolithiasis but no obstructing calculus is identified. No strong evidence of pyelonephritis, although IV contrast would be necessary for that CT diagnosis. 4. Splenomegaly, which may be secondary to #1 rather than due to chronic liver disease. 5. Aortic Atherosclerosis (ICD10-I70.0). Electronically Signed   By: Genevie Ann M.D.   On: 05/08/2020 09:16   NM Renal Imaging Flow W/Pharm  Result Date: 05/10/2020 CLINICAL DATA:  Evaluate hydronephrosis of the left kidney. Status post bilobed plasty. EXAM: NUCLEAR MEDICINE RENAL SCAN WITH DIURETIC ADMINISTRATION TECHNIQUE: Radionuclide angiographic and sequential renal images were obtained after intravenous injection of radiopharmaceutical. Imaging was continued during slow intravenous injection of Lasix approximately 15 minutes after the start of the examination. RADIOPHARMACEUTICALS:  5.5 mCi Technetium-80m MAG3 IV COMPARISON:  05/08/2020 FINDINGS: Flow: Normal flow to the right kidney. Markedly diminished in delayed perfusion to the left kidney. Left renogram: Asymmetric delayed cortical uptake, excretion, and clearance of the radiopharmaceutical. On the postvoid images there is persistent radiopharmaceutical accumulation within the dilated left renal collecting system. Right renogram: Normal cortical uptake, excretion in clearance of the radiopharmaceutical. Differential: Left kidney = 23 % Right kidney = 77 % T1/2 post Lasix : Technical difficulties with Lasix administration resulted in the diuretic being administered at 80 minutes with only 10 minutes remaining. IMPRESSION: 1. Normally functioning right kidney. 2. Delayed left renal perfusion, cortical  uptake, excretion and clearance of the  radiopharmaceutical consistent with obstructive uropathy. Electronically Signed   By: Kerby Moors M.D.   On: 05/10/2020 14:35   DG Retrograde Pyelogram  Result Date: 05/12/2020 CLINICAL DATA:  80 year old female with a history of left-sided hydronephrosis with history of prior pyloroplasty EXAM: RETROGRADE PYELOGRAM COMPARISON:  CT 05/08/2020 FINDINGS: Limited fluoroscopic images during retrograde pyelogram. Images demonstrate partial opacification of the left-sided renal pelvis and collecting system with pelvicaliectasis and hydronephrosis. The visualized ureter is of relatively normal caliber. Final image demonstrates placement of a double-J ureteral stent IMPRESSION: Limited fluoroscopic spot images during retrograde pyelogram demonstrates treatment of the left collecting system with placement of a double-J stent. Please refer to the dictated operative report for full details of intraoperative findings and procedure. Electronically Signed   By: Corrie Mckusick D.O.   On: 05/12/2020 13:38   US RENAL  Result Date: 05/07/2020 CLINICAL DATA:  Pyelonephritis. EXAM: RENAL / URINARY TRACT ULTRASOUND COMPLETE COMPARISON:  None. FINDINGS: Right Kidney: Renal measurements: 11.4 x 5.1 x 5.1 cm = volume: 154.6 mL. Echogenicity within normal limits. No mass or hydronephrosis visualized. Left Kidney: Renal measurements: 10.8 x 5.3 x 6.6 cm = volume: 198.8 mL. Moderate hydronephrosis identified on the left. Bladder: Appears normal for degree of bladder distention. Other: The spleen is enlarged measuring 13.8 x 12.7 x 5.8 cm with a volume of 537 cc. IMPRESSION: 1. Moderate left hydronephrosis of uncertain chronicity. Acute/active obstruction not excluded on this study. Recommend clinical correlation. CT imaging could further evaluate for an underlying cause. 2. Splenomegaly. Electronically Signed   By: Dorise Bullion III M.D   On: 05/07/2020 16:55   DG Chest Portable 1 View  Result Date: 05/06/2020 CLINICAL DATA:   Fever, chills and cough. EXAM: PORTABLE CHEST 1 VIEW COMPARISON:  None. FINDINGS: The lungs are hyperinflated. There is no evidence of acute infiltrate, pleural effusion or pneumothorax. The heart size and mediastinal contours are within normal limits. Multiple chronic left-sided rib fractures are seen. A radiopaque fusion plate and screws are seen within the proximal right humerus. IMPRESSION: No active cardiopulmonary disease. Electronically Signed   By: Virgina Norfolk M.D.   On: 05/06/2020 15:41

## 2020-05-12 NOTE — Interval H&P Note (Signed)
History and Physical Interval Note:  05/12/2020 11:49 AM  Sarah [name]  has presented today for surgery, with the diagnosis of UPJ OBSTRUCTION.  The various methods of treatment have been discussed with the patient and family. After consideration of risks, benefits and other options for treatment, the patient has consented to  Procedure(s): [place]oad [place] (N/A) as a surgical intervention.  The patient's history has been reviewed, patient examined, no change in status, stable for surgery.  I have reviewed the patient's chart and labs.  Questions were answered to the patient's satisfaction.    Left ureteral stent Marton Redwood, III

## 2020-05-12 NOTE — Op Note (Signed)
Operative Note  Preoperative diagnosis:  1.  UTI 2.  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  Cystoscopy with left retrograde pyelogram and left ureteral stent placement  Surgeon: Link Snuffer, MD  Assistants: None  Anesthesia: General  Complications: None immediate  EBL: Minimal  Specimens: 1.  None  Drains/Catheters: 1.  6 X 24 double-J ureteral stent  Intraoperative findings: 1.  Normal urethra and bladder.  Specifically, there was no erosion of prior intraurethral bulking agent 2.  Left retrograde pyelogram revealed severe hydronephrosis.  Most likely secondary to UPJ obstruction rather than extrinsic compression.  Indication: 80 year old female with history of recurrent urinary tract infections currently admitted with another UTI.  She continues to have intermittent low-grade fevers.  CT scan showed left-sided hydronephrosis.  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  Renal scan was performed that revealed 23% function with evidence of obstruction.  She presents for retrograde pyelogram and stent placement.  Description of procedure:  The patient was identified and consent was obtained.  The patient was taken to the operating room and placed in the supine position.  The patient was placed under general anesthesia.  Perioperative antibiotics were administered.  The patient was placed in dorsal lithotomy.  Patient was prepped and draped in a standard sterile fashion and a timeout was performed.  A 21 French rigid cystoscope was advanced into the urethra and into the bladder.  The left distal most portion of the ureter was cannulated with an open-ended ureteral catheter.  Retrograde pyelogram was performed with the findings noted above.  A sensor wire was then advanced up to the kidney under fluoroscopic guidance.  A 6 X 24 double-J ureteral stent was  advanced up to the kidney under fluoroscopic guidance.  The wire was withdrawn and fluoroscopy confirmed good proximal placement and direct visualization confirmed a good coil within the bladder.  The bladder was drained and the scope withdrawn.  This concluded the operation.  Patient tolerated procedure well and was stable postoperatively.  Plan: Continue IV antibiotics.  Cultures thus far have been negative here.  As long as she does well, recommend switching to Keflex tomorrow.  If she does well on that, she can be discharged with Keflex with plans for follow-up with me.  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  After her course of 2 weeks of full dose antibiotics, she should then be switched to 250 mg nightly Keflex UTI prophylaxis.

## 2020-05-13 ENCOUNTER — Encounter (HOSPITAL_COMMUNITY): Payer: Self-pay | Admitting: Urology

## 2020-05-13 LAB — CBC WITH DIFFERENTIAL/PLATELET
Abs Immature Granulocytes: 0.09 10*3/uL — ABNORMAL HIGH (ref 0.00–0.07)
Basophils Absolute: 0.1 10*3/uL (ref 0.0–0.1)
Basophils Relative: 0 %
Eosinophils Absolute: 0 10*3/uL (ref 0.0–0.5)
Eosinophils Relative: 0 %
HCT: 27.1 % — ABNORMAL LOW (ref 36.0–46.0)
Hemoglobin: 8.4 g/dL — ABNORMAL LOW (ref 12.0–15.0)
Immature Granulocytes: 0 %
Lymphocytes Relative: 89 %
Lymphs Abs: 27 10*3/uL — ABNORMAL HIGH (ref 0.7–4.0)
MCH: 32.6 pg (ref 26.0–34.0)
MCHC: 31 g/dL (ref 30.0–36.0)
MCV: 105 fL — ABNORMAL HIGH (ref 80.0–100.0)
Monocytes Absolute: 0.4 10*3/uL (ref 0.1–1.0)
Monocytes Relative: 1 %
Neutro Abs: 3 10*3/uL (ref 1.7–7.7)
Neutrophils Relative %: 10 %
Platelets: 177 10*3/uL (ref 150–400)
RBC: 2.58 MIL/uL — ABNORMAL LOW (ref 3.87–5.11)
RDW: 15.1 % (ref 11.5–15.5)
WBC: 30.6 10*3/uL — ABNORMAL HIGH (ref 4.0–10.5)
nRBC: 0 % (ref 0.0–0.2)

## 2020-05-13 LAB — BRAIN NATRIURETIC PEPTIDE: B Natriuretic Peptide: 163.3 pg/mL — ABNORMAL HIGH (ref 0.0–100.0)

## 2020-05-13 LAB — COMPREHENSIVE METABOLIC PANEL
ALT: 14 U/L (ref 0–44)
AST: 19 U/L (ref 15–41)
Albumin: 2.6 g/dL — ABNORMAL LOW (ref 3.5–5.0)
Alkaline Phosphatase: 106 U/L (ref 38–126)
Anion gap: 6 (ref 5–15)
BUN: 16 mg/dL (ref 8–23)
CO2: 28 mmol/L (ref 22–32)
Calcium: 8.2 mg/dL — ABNORMAL LOW (ref 8.9–10.3)
Chloride: 105 mmol/L (ref 98–111)
Creatinine, Ser: 1.07 mg/dL — ABNORMAL HIGH (ref 0.44–1.00)
GFR, Estimated: 53 mL/min — ABNORMAL LOW (ref >60)
Glucose, Bld: 155 mg/dL — ABNORMAL HIGH (ref 70–99)
Potassium: 4.7 mmol/L (ref 3.5–5.1)
Sodium: 139 mmol/L (ref 135–145)
Total Bilirubin: 0.2 mg/dL — ABNORMAL LOW (ref 0.3–1.2)
Total Protein: 4.8 g/dL — ABNORMAL LOW (ref 6.5–8.1)

## 2020-05-13 MED ORDER — LIDOCAINE 5 % EX PTCH
1.0000 | MEDICATED_PATCH | Freq: Every day | CUTANEOUS | Status: DC
  Administered 2020-05-13: 1 via TRANSDERMAL
  Filled 2020-05-13: qty 1

## 2020-05-13 NOTE — Care Management Important Message (Signed)
Important Message  Patient Details  Name: Sarah [name] MRN: [id number] Date of Birth: 1941/01/17   Medicare Important Message Given:  Yes - Important Message mailed due to current National Emergency   Verbal consent obtained due to current National Emergency  Relationship to patient: Self Contact Name: Earlie Call Date: 05/13/20  Time: 1404 Phone: [phone] Outcome: Spoke with contact Important Message mailed to: Patient address on file    Delorse Lek 05/13/2020, 2:04 PM

## 2020-05-13 NOTE — Progress Notes (Addendum)
Occupational Therapy Treatment Patient Details Name: Sarah [name] MRN: [id number] DOB: [birth year] Today's Date: 05/13/2020    History of present illness Sarah [name] is a 80 y/o female who reported to ED with complaints of body aches, chills, fever, and runny nose. Pt was admitted on 05/06/20 and diagnosed with sepsis with UTI. PMH includes hx of UTIs, chronic lymphocytic leukemia, breast cancer in remission, HTN, HLD, gout, and GERD.   OT comments  Pt progressing well towards OT goals, able to demo mobility in room using RW at Supervision level, no LOB and vitals stable throughout session on RA. Placed BSC over toilet with pt able to demo improved ability to stand after toileting task at supervision level. Pt still with some difficulty in reaching for LB dressing tasks, requiring Min A - educated on positional strategies to trial for increased independence, as well as consideration of AE. Discussed home setup with pt appearing to have all needed DME though may look into smaller shower chair rather than continue use of tub bench at home. Pt has Rollator at home that assists with energy conservation as rest breaks needed due to fatigue. Pt reports having higher toilet and grab bars, as well as BSC that can be used during the night. DC recs updated to include no DME, but would still benefit from [facility] follow-up.    Follow Up Recommendations  Home health OT;Supervision - Intermittent    Equipment Recommendations  None recommended by OT (plans to locate smaller shower chair as needed)    Recommendations for Other Services      Precautions / Restrictions Precautions Precautions: Fall Restrictions Weight Bearing Restrictions: No       Mobility Bed Mobility Overal bed mobility: Needs Assistance Bed Mobility: Supine to Sit;Sit to Supine     Supine to sit: Supervision Sit to supine: Supervision   General bed mobility comments: bed mobility from flat bed, increased time    Transfers Overall  transfer level: Needs assistance Equipment used: Rolling walker (2 wheeled) Transfers: Sit to/from [facility] Sit to Stand: Supervision Stand pivot transfers: Supervision       General transfer comment: supervision with no LOB, minor cues for safe hand placement but improving well    Balance Overall balance assessment: Needs assistance Sitting-balance support: No upper extremity supported;Feet supported Sitting balance-Leahy Scale: Good     Standing balance support: Bilateral upper extremity supported;During functional activity;Single extremity supported Standing balance-Leahy Scale: Fair Standing balance comment: fair static standing at toilet but need for UE support for dynamic tasks                           ADL either performed or assessed with clinical judgement   ADL Overall ADL's : Needs assistance/impaired                     Lower Body Dressing: Minimal assistance;Sit to/from stand;Sitting/lateral leans Lower Body Dressing Details (indicate cue type and reason): Difficulty reaching B feet, able to cross legs to reach top of socks. Educated on positioning strategies such as propping foot up on bed and pt likely to be able to complete this. Educated on use of sock aid as needed Toilet Transfer: Supervision/safety;Ambulation;RW;BSC;Regular Glass blower/designer Details (indicate cue type and reason): Improved ability to stand from regular toilet with BSC over it. Pt reports having a higher toilet at home with grab bars Toileting- Clothing Manipulation and Hygiene: Set up;Sit to/from stand;Sitting/lateral lean Toileting - Clothing  Manipulation Details (indicate cue type and reason): Setup for gathering toilet paper but no difficulty     Functional mobility during ADLs: Supervision/safety;Rolling walker General ADL Comments: VSS on RA. Pt endorsed fatigue after mobility and toileting. Likely due to decreased endurance/strength though  improving. Discussed having BSC at bedside at home to have as option to use at night if needed. Pt has tub bench, reports it is bulky and may look into smaller shower chair     Vision   Vision Assessment?: No apparent visual deficits   Perception     Praxis      Cognition Arousal/Alertness: Awake/alert Behavior During Therapy: WFL for tasks assessed/performed Overall Cognitive Status: Within Functional Limits for tasks assessed                                          Exercises     Shoulder Instructions       General Comments VSS on RA. IV in hand noted to be bleeding with WB to push up from bed. Notified Biomedical engineer via call bell who notified RN though was not present during session so OT taped pt and secured IV to allow for completion of session    Pertinent Vitals/ Pain       Pain Assessment: No/denies pain  Home Living                                          Prior Functioning/Environment              Frequency  Min 2X/week        Progress Toward Goals  OT Goals(current goals can now be found in the care plan section)  Progress towards OT goals: Progressing toward goals  Acute Rehab OT Goals Patient Stated Goal: get stronger and go home, be able to go visit great grandbaby when born OT Goal Formulation: With patient Time For Goal Achievement: 05/22/20 Potential to Achieve Goals: Good ADL Goals Pt Will Perform Grooming: with supervision;standing Pt Will Perform Upper Body Bathing: with set-up;sitting Pt Will Perform Lower Body Bathing: with supervision;sit to/from stand;with adaptive equipment Pt Will Perform Upper Body Dressing: with set-up;sitting Pt Will Perform Lower Body Dressing: with supervision;with adaptive equipment;sit to/from stand Pt Will Transfer to Toilet: with supervision;ambulating;bedside commode Pt Will Perform Toileting - Clothing Manipulation and hygiene: with supervision;sit to/from stand   Plan Discharge plan remains appropriate    Co-evaluation                 AM-PAC OT "6 Clicks" Daily Activity     Outcome Measure   Help from another person eating meals?: None Help from another person taking care of personal grooming?: A Little Help from another person toileting, which includes using toliet, bedpan, or urinal?: A Little Help from another person bathing (including washing, rinsing, drying)?: A Little Help from another person to put on and taking off regular upper body clothing?: None Help from another person to put on and taking off regular lower body clothing?: A Little 6 Click Score: 20    End of Session Equipment Utilized During Treatment: Rolling walker  OT Visit Diagnosis: Unsteadiness on feet (R26.81);Other abnormalities of gait and mobility (R26.89);Muscle weakness (generalized) (M62.81)   Activity Tolerance Patient tolerated treatment well   Patient Left  in bed;with call bell/phone within reach   Nurse Communication Other (comment) (bleeding IV)        Time: 0827-0920 OT Time Calculation (min): 53 min  Charges: OT General Charges $OT Visit: 1 Visit OT Treatments $Self Care/Home Management : 38-52 mins $Therapeutic Activity: 8-22 mins  Malachy Chamber, OTR/L Acute Rehab Services Office: [phone]   Sarah [name] 05/13/2020, 10:10 AM

## 2020-05-13 NOTE — Care Management (Signed)
Case manager was asked to speak with patient and her daughter concerning needs at discharge. CM called Daughter-Maureen [facility] [phone]. Updated her that her mom has been setup with [facility] for HHPT/OT/Aide services. Also addressed questions about having someone in the home to assist with dressing and toileting. CM explained how Home Health therapy works and that [place] will not be at the home daily for extended time. [name] states that her mom has to be able to dress her self and toilet herself, including putting on and removing her depends. CM explained that per MD and therapy notes patient is progressing well and will not meet for SNF placement.Maureen appreciated the call . MD is aware and plan is for patient to discharge home on Friday. TOC Team will continue to monitor.  Ricki Miller, RN BSN Case Manager

## 2020-05-13 NOTE — Progress Notes (Signed)
Physical Therapy Treatment Patient Details Name: Sarah [name] MRN: [id number] DOB: 12-15-1940 Today's Date: 05/13/2020    History of Present Illness Sarah [name] is a 80 y/o female who reported to ED with complaints of body aches, chills, fever, and runny nose. Pt was admitted on 05/06/20 and diagnosed with sepsis with UTI. PMH includes hx of UTIs, chronic lymphocytic leukemia, breast cancer in remission, HTN, HLD, gout, and GERD.    PT Comments    Pt able to ambulate short distance in room. She reports feeling better and less fatigued today. VSS on RA. Once seated in recliner chair pt performed LE exercises. Will continue to follow acutely for mobility progression.   Follow Up Recommendations  Home health PT;Supervision for mobility/OOB     Equipment Recommendations  None recommended by PT    Recommendations for Other Services       Precautions / Restrictions Precautions Precautions: Fall    Mobility  Bed Mobility Overal bed mobility: Needs Assistance Bed Mobility: Supine to Sit     Supine to sit: Supervision     General bed mobility comments: supervision for safety    Transfers Overall transfer level: Needs assistance Equipment used: Rolling walker (2 wheeled) Transfers: Sit to/from [employer] Transfers Sit to Stand: Supervision Stand pivot transfers: Supervision       General transfer comment: supervision with no LOB, minor cues for safe hand placement but improving well  Ambulation/Gait Ambulation/Gait assistance: Min guard Gait Distance (Feet): 15 Feet Assistive device: Rolling walker (2 wheeled) Gait Pattern/deviations: Step-through pattern;Decreased stride length;Trunk flexed Gait velocity: decreased   General Gait Details: cues for posture, pt with no evidence of instability, but with heavy relaince on BUE support   Stairs             Wheelchair Mobility    Modified Rankin (Stroke Patients Only)       Balance Overall balance  assessment: Needs assistance Sitting-balance support: No upper extremity supported;Feet supported Sitting balance-Leahy Scale: Good     Standing balance support: Bilateral upper extremity supported;During functional activity;Single extremity supported Standing balance-Leahy Scale: Fair                              Cognition Arousal/Alertness: Awake/alert Behavior During Therapy: WFL for tasks assessed/performed Overall Cognitive Status: Within Functional Limits for tasks assessed                                        Exercises General Exercises - Lower Extremity Ankle Circles/Pumps: AROM;Both;10 reps;Seated Long Arc Quad: AROM;Both;10 reps;Seated Hip Flexion/Marching: AROM;Both;10 reps;Seated    General Comments General comments (skin integrity, edema, etc.): on 3L during session with VSS      Pertinent Vitals/Pain Pain Assessment: No/denies pain    Home Living                      Prior Function            PT Goals (current goals can now be found in the care plan section) Acute Rehab PT Goals Patient Stated Goal: get stronger and go home, be able to go visit great grandbaby when born PT Goal Formulation: With patient Time For Goal Achievement: 05/21/20 Potential to Achieve Goals: Good Progress towards PT goals: Progressing toward goals    Frequency    Min 3X/week  PT Plan Current plan remains appropriate    Co-evaluation              AM-PAC PT "6 Clicks" Mobility   Outcome Measure  Help needed turning from your back to your side while in a flat bed without using bedrails?: A Little Help needed moving from lying on your back to sitting on the side of a flat bed without using bedrails?: A Little Help needed moving to and from a bed to a chair (including a wheelchair)?: A Little Help needed standing up from a chair using your arms (e.g., wheelchair or bedside chair)?: A Little Help needed to walk in hospital  room?: A Little Help needed climbing 3-5 steps with a railing? : A Little 6 Click Score: 18    End of Session Equipment Utilized During Treatment: Gait belt Activity Tolerance: Patient tolerated treatment well Patient left: in chair;with call bell/phone within reach;with chair alarm set Nurse Communication: Mobility status PT Visit Diagnosis: Unsteadiness on feet (R26.81);Muscle weakness (generalized) (M62.81)     Time: 5520-8022 PT Time Calculation (min) (ACUTE ONLY): 23 min  Charges:  $Gait Training: 8-22 mins $Therapeutic Exercise: 8-22 mins                    Benjiman Core, [place] Pager [phone] Acute Rehab  Allena Katz 05/13/2020, 1:00 PM

## 2020-05-13 NOTE — Progress Notes (Signed)
PROGRESS NOTE                                                                                                                                                                                                             Patient Demographics:    Sarah [name], is a 80 y.o. female, DOB - 1940-08-01, HEN:[id number]  Outpatient Primary MD for the patient is Libby Maw, MD    LOS - 6  Admit date - 05/06/2020    Chief Complaint  Patient presents with  . URI       Brief Narrative (HPI from H&P)   Sarah [name] is a 80 y.o. 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   Subjective:   Patient in bed, denies any complaints, no dysuria, no polyuria, no abdominal pain, no fever or chills.      Assessment  & Plan :    Sepsis with UTI  - Recurrent UTIs   - 4-5 episode in 2 mths. -Continue with IV Rocephin . -Blood cultures with no growth to date . 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    AKI and Hypotension -improved with IV fluids, improved with IV antibiotics  CLL - post DC follow with Dr Lorenso Courier.  Dyslipidemia - on Zetia.  GERD - PPI.   Depression - home Rx.   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   HTN - placed on Norvasc along with as needed IV hydralazine.        Condition - Extremely Guarded  Family Communication  : None at bedside  Code Status :  DNR  Consults  : Urology  PUD Prophylaxis : None   Procedures  :     Renal Nuclear Scan - obstructive uropathy  Renal US - L . Hydronephrosis  CT - 1. Moderate to  severe retroperitoneal and mesenteric lymphadenopathy suspicious for Lymphoma, Leukemia, or less likely other lymphoproliferative disorder. A CT guided left pararenal lymph node needle biopsy should be feasible. 2. Sigmoid colon appears inflamed compatible with acute diverticulitis or  colitis. Mild secondary inflammation of the nearby Bladder Dome. No evidence of colovesical fistula or abscess. Underlying extensive small bowel diverticulosis. But no other bowel inflammation identified on this noncontrast exam. 3. Moderate to severe left hydronephrosis but with abrupt tapering at the left ureteropelvic junction suggesting UPJ stenosis. Right greater than left nephrolithiasis but no obstructing calculus is identified. No strong evidence of pyelonephritis, although IV contrast would be necessary for that CT diagnosis. 4. Splenomegaly, which may be secondary to #1 rather than due to chronic liver disease. 5. Aortic Atherosclerosis      Disposition Plan  :    Status is: inpatient  Dispo: The patient is from: Home              Anticipated d/c is to: Home              Patient currently is not medically stable to d/c.   Difficult to place patient No   DVT Prophylaxis  :  Lovenox    Lab Results  Component Value Date   PLT 177 05/13/2020    Diet :  Diet Order            Diet Heart Room service appropriate? Yes; Fluid consistency: Thin  Diet effective now                  Inpatient Medications  Scheduled Meds: . amLODipine  10 mg Oral Daily  . busPIRone  15 mg Oral BID  . enoxaparin (LOVENOX) injection  40 mg Subcutaneous Q24H  . ezetimibe  10 mg Oral Daily  . feeding supplement  237 mL Oral BID BM  . lidocaine  1 patch Transdermal QHS  . pantoprazole  40 mg Oral Daily  . potassium chloride  10 mEq Oral Daily  . venlafaxine XR  150 mg Oral Q breakfast   Continuous Infusions: . cefTRIAXone (ROCEPHIN)  IV 2 g (05/13/20 1011)  . lactated ringers 10 mL/hr at 05/12/20 1155   PRN  Meds:.acetaminophen **OR** acetaminophen, hydrALAZINE, loperamide  Antibiotics  :    Anti-infectives (From admission, onward)   Start     Dose/Rate Route Frequency Ordered Stop   05/07/20 1600  cefTRIAXone (ROCEPHIN) 1 g in sodium chloride 0.9 % 100 mL IVPB  Status:  Discontinued        1 g 200 mL/hr over 30 Minutes Intravenous Every 24 hours 05/06/20 2030 05/07/20 0736   05/07/20 0800  cefTRIAXone (ROCEPHIN) 2 g in sodium chloride 0.9 % 100 mL IVPB        2 g 200 mL/hr over 30 Minutes Intravenous Every 24 hours 05/07/20 0736     05/06/20 1645  cefTRIAXone (ROCEPHIN) 1 g in sodium chloride 0.9 % 100 mL IVPB        1 g 200 mL/hr over 30 Minutes Intravenous  Once 05/06/20 1636 05/06/20 1753        Rendon Howell M.D on 05/13/2020 at 1:39 PM  To page go to [email or web address]   [facility] -  Office  [phone]    See all Orders from today for further details    Objective:   Vitals:   05/12/20 2000 05/13/20 0021 05/13/20 0400 05/13/20 1145  BP: (!) 109/57 (!) 117/57 (!) 110/57 (!) 112/56  Pulse: 86 79 77 72  Resp: (!) 22 18 20 18   Temp: 98.8 F (37.1 C) 98.9 F (37.2 C) 98.7 F (37.1 C) 98.7 F (37.1 C)  TempSrc: Axillary Axillary  Axillary  SpO2: 90% 91% 92% 94%  Weight:      Height:        Wt Readings from Last 3 Encounters:  05/12/20 76.2 kg  04/26/20 73.3 kg  02/10/20 73.8 kg     Intake/Output Summary (Last 24 hours) at 05/13/2020 1339 Last data filed at 05/13/2020 0500 Gross per 24 hour  Intake 120 ml  Output 500 ml  Net -380 ml     Physical Exam  Awake Alert, Oriented X 3, No new F.N deficits, Normal affect Symmetrical Chest wall movement, Good air movement bilaterally, CTAB RRR,No Gallops,Rubs or new Murmurs, No Parasternal Heave +ve B.Sounds, Abd Soft, No tenderness, No rebound - guarding or rigidity. No Cyanosis, Clubbing or edema, No new Rash or bruise       Data Review:    CBC Recent Labs  Lab 05/09/20 0244 05/10/20 0337  05/11/20 0302 05/12/20 0224 05/13/20 0131  WBC 26.6* 26.0* 27.9* 27.1* 30.6*  HGB 8.4* 9.1* 9.2* 9.2* 8.4*  HCT 25.7* 28.8* 29.0* 28.5* 27.1*  PLT 191 198 198 189 177  MCV 101.6* 102.9* 101.4* 102.9* 105.0*  MCH 33.2 32.5 32.2 33.2 32.6  MCHC 32.7 31.6 31.7 32.3 31.0  RDW 15.0 14.8 15.0 15.2 15.1  LYMPHSABS 18.6* 23.1* 22.9* 21.1* 27.0*  MONOABS 0.3 0.3 0.0* 0.3 0.4  EOSABS 1.1* 0.4 0.6* 0.8* 0.0  BASOSABS 0.3* 0.1 0.0 0.0 0.1    Recent Labs  Lab 05/06/20 1518 05/06/20 2241 05/07/20 0139 05/07/20 0223 05/08/20 0221 05/09/20 0244 05/10/20 0337 05/11/20 0302 05/12/20 0224 05/13/20 0131  NA 137  --    < >  --  139 138 139 137 137 139  K 4.6  --    < >  --  4.4 4.0 3.7 3.4* 3.9 4.7  CL 107  --    < >  --  108 106 106 102 103 105  CO2 20*  --    < >  --  23 24 25 27 26 28   GLUCOSE 125*  --    < >  --  113* 108* 102* 95 101* 155*  BUN 31*  --    < >  --  17 15 10 11 13 16   CREATININE 1.41*  --    < >  --  1.13* 1.11* 1.09* 1.23* 1.19* 1.07*  CALCIUM 8.4*  --    < >  --  8.7* 8.5* 8.8* 8.5* 8.5* 8.2*  AST 25  --   --   --  16 14* 17 16 19 19   ALT 24  --   --   --  18 16 14 15 14 14   ALKPHOS 171*  --   --   --  127* 106 114 99 117 106  BILITOT 0.4  --   --   --  0.5 0.4 0.4 0.2* 0.5 0.2*  ALBUMIN 3.7  --   --   --  2.7* 2.5* 2.6* 2.7* 2.7* 2.6*  MG  --   --   --  1.9 1.7 1.7 1.7 1.6*  --   --   PROCALCITON  --   --   --  0.29 0.22 0.14  --   --   --   --   LATICACIDVEN 1.1 1.1  --   --   --   --   --   --   --   --   BNP  --   --    < > 193.1* 252.0* 135.8* 257.5* 115.3* 80.0 163.3*   < > =  values in this interval not displayed.    ------------------------------------------------------------------------------------------------------------------ No results for input(s): CHOL, HDL, LDLCALC, TRIG, CHOLHDL, LDLDIRECT in the last 72 hours.  No results found for:  HGBA1C ------------------------------------------------------------------------------------------------------------------ No results for input(s): TSH, T4TOTAL, T3FREE, THYROIDAB in the last 72 hours.  Invalid input(s): FREET3  Cardiac Enzymes No results for input(s): CKMB, TROPONINI, MYOGLOBIN in the last 168 hours.  Invalid input(s): CK ------------------------------------------------------------------------------------------------------------------    Component Value Date/Time   BNP 163.3 (H) 05/13/2020 0131    Micro Results Recent Results (from the past 240 hour(s))  Covid-19, Flu A+B ([facility])     Status: None   Collection Time: 05/06/20  2:11 PM   Specimen: Nasopharyngeal   Naso  Result Value Ref Range Status   SARS-CoV-2, NAA Not Detected Not Detected Final    Comment: This nucleic acid amplification test was developed and its performance characteristics determined by [employer]. Nucleic acid amplification tests include RT-PCR and TMA. This test has not been FDA cleared or approved. This test has been authorized by FDA under an Emergency Use Authorization (EUA). This test is only authorized for the duration of time the declaration that circumstances exist justifying the authorization of the emergency use of in vitro diagnostic tests for detection of SARS-CoV-2 virus and/or diagnosis of COVID-19 infection under section 564(b)(1) of the Act, 21 U.S.C. 259DGL-8(V) (1), unless the authorization is terminated or revoked sooner. When diagnostic testing is negative, the possibility of a false negative result should be considered in the context of a patient's recent exposures and the presence of clinical signs and symptoms consistent with COVID-19. An individual without symptoms of COVID-19 and who is not shedding SARS-CoV-2 virus wo uld expect to have a negative (not detected) result in this assay.    Influenza A, NAA Not Detected Not Detected Final   Influenza B,  NAA Not Detected Not Detected Final  Resp Panel by RT-PCR (Flu A&B, Covid) Nasopharyngeal Swab     Status: None   Collection Time: 05/06/20  3:18 PM   Specimen: Nasopharyngeal Swab; Nasopharyngeal(NP) swabs in vial transport medium  Result Value Ref Range Status   SARS Coronavirus 2 by RT PCR NEGATIVE NEGATIVE Final    Comment: (NOTE) SARS-CoV-2 target nucleic acids are NOT DETECTED.  The SARS-CoV-2 RNA is generally detectable in upper respiratory specimens during the acute phase of infection. The lowest concentration of SARS-CoV-2 viral copies this assay can detect is 138 copies/mL. A negative result does not preclude SARS-Cov-2 infection and should not be used as the sole basis for treatment or other patient management decisions. A negative result may occur with  improper specimen collection/handling, submission of specimen other than nasopharyngeal swab, presence of viral mutation(s) within the areas targeted by this assay, and inadequate number of viral copies(<138 copies/mL). A negative result must be combined with clinical observations, patient history, and epidemiological information. The expected result is Negative.  Fact Sheet for Patients:  EntrepreneurPulse.com.au  Fact Sheet for Healthcare Providers:  IncredibleEmployment.be  This test is no t yet approved or cleared by the [place] FDA and  has been authorized for detection and/or diagnosis of SARS-CoV-2 by FDA under an Emergency Use Authorization (EUA). This EUA will remain  in effect (meaning this test can be used) for the duration of the COVID-19 declaration under Section 564(b)(1) of the Act, 21 U.S.C.section 360bbb-3(b)(1), unless the authorization is terminated  or revoked sooner.       Influenza A by PCR NEGATIVE NEGATIVE Final   Influenza B by  PCR NEGATIVE NEGATIVE Final    Comment: (NOTE) The Xpert Xpress SARS-CoV-2/FLU/RSV plus assay is intended as an aid in the  diagnosis of influenza from Nasopharyngeal swab specimens and should not be used as a sole basis for treatment. Nasal washings and aspirates are unacceptable for Xpert Xpress SARS-CoV-2/FLU/RSV testing.  Fact Sheet for Patients: EntrepreneurPulse.com.au  Fact Sheet for Healthcare Providers: IncredibleEmployment.be  This test is not yet approved or cleared by the [place] FDA and has been authorized for detection and/or diagnosis of SARS-CoV-2 by FDA under an Emergency Use Authorization (EUA). This EUA will remain in effect (meaning this test can be used) for the duration of the COVID-19 declaration under Section 564(b)(1) of the Act, 21 U.S.C. section 360bbb-3(b)(1), unless the authorization is terminated or revoked.  Performed at [facility]   Culture, blood (routine x 2)     Status: None   Collection Time: 05/06/20  3:20 PM   Specimen: BLOOD  Result Value Ref Range Status   Specimen Description   Final    BLOOD Performed at [facility]    Special Requests   Final    LEFT ANTECUBITAL Blood Culture adequate volume Performed at [facility]    Culture   Final    NO GROWTH 5 DAYS Performed at [facility], [place] [address], [place], [place] [address]    Report Status 05/12/2020 FINAL  Final  Culture, blood (routine x 2)     Status: None   Collection Time: 05/06/20  4:50 PM   Specimen: BLOOD  Result Value Ref Range Status   Specimen Description   Final    BLOOD Performed at [facility]    Special Requests   Final    RIGHT ANTECUBITAL Blood Culture adequate volume Performed at [facility]    Culture   Final    NO GROWTH 5 DAYS Performed at [facility], [place] [address], [place], [place] [address]    Report Status 05/12/2020 FINAL  Final    Radiology Reports CT ABDOMEN PELVIS WO CONTRAST  Result Date: 05/08/2020 CLINICAL DATA:   80 year old female with pyelonephritis. EXAM: CT ABDOMEN AND PELVIS WITHOUT CONTRAST TECHNIQUE: Multidetector CT imaging of the abdomen and pelvis was performed following the standard protocol without IV contrast. COMPARISON:  Renal ultrasound yesterday. FINDINGS: Lower chest: Mild cardiomegaly. Tortuous descending thoracic aorta. No pericardial effusion. Minimal lung base atelectasis. Hepatobiliary: Mildly nodular liver contour. Circumscribed round low-density area measuring 12 mm on series 3, image 21 has simple fluid density and is likely a benign cyst. No other discrete liver lesion on this noncontrast exam. Diminutive or absent gallbladder. Pancreas: Negative noncontrast pancreas. Spleen: Splenomegaly. Estimated splenic volume 535 mL (normal splenic volume range 83 - 412 mL). No discrete splenic lesion. Adrenals/Urinary Tract: Negative adrenal glands. Right kidney appears nonobstructed although there is a round 9 mm calculus in the right renal pelvis (series 6, image 82). Right ureter is decompressed. The distal ureter is difficult to delineate but appears to remain decompressed to the to the bladder. There is moderate to severe left hydronephrosis. But the left ureteropelvic junction appears abruptly tapered on coronal image 79. Only a punctate left intrarenal calculus is identified. No left hydroureter is evident. The left UVJ appears normal. Diminutive urinary bladder, with mild bladder wall thickening in the region of sigmoid colon inflammatory stranding. See below. Numerous pelvic phleboliths. Stomach/Bowel: Retained stool in the rectum with no rectal wall thickening  or inflammation identified. However, the upstream sigmoid colon is thickened and with mild mesenteric stranding as seen on coronal image 68 which extends to the nearby bladder dome. There are diverticula in the region. This inflammation is in a segment of fiber 6 cm. Diverticulosis in the upstream descending colon without active inflammation.  Mild retained stool proximal to the sigmoid. Diminutive or absent appendix. Terminal ileum also remarkable for fairly extensive diverticulosis which tracks into the upstream distal small bowel. No dilated small bowel. No free air or free fluid. No other convincing acute bowel inflammation. Stomach and duodenum are decompressed. Vascular/Lymphatic: Aortoiliac calcified atherosclerosis. Tortuous abdominal aorta. Vascular patency is not evaluated in the absence of IV contrast. Additionally, there is nonspecific appearing lymphadenopathy in the small bowel mesentery (coronal images 54-59) with individual lymph nodes up to 13 mm short axis. Associated haziness of the root of the mesentery. Furthermore, there is bulky left side retroperitoneal lymphadenopathy, with individual pararenal nodes up to 23 mm short axis. The lymphadenopathy abates at the pelvic inlet. No inguinal lymphadenopathy. Reproductive: Absent uterus.  Diminutive or absent ovaries. Other: Mild presacral stranding is nonspecific. No pelvic free fluid. Musculoskeletal: Several chronic left posterior rib fractures. Grade 1 degenerative lower lumbar spondylolisthesis with severe facet degeneration. Severe chronic disc degeneration at the lumbosacral junction. No acute osseous abnormality identified. IMPRESSION: 1. Moderate to severe retroperitoneal and mesenteric lymphadenopathy suspicious for Lymphoma, Leukemia, or less likely other lymphoproliferative disorder. A CT guided left pararenal lymph node needle biopsy should be feasible. 2. Sigmoid colon appears inflamed compatible with acute diverticulitis or colitis. Mild secondary inflammation of the nearby Bladder Dome. No evidence of colovesical fistula or abscess. Underlying extensive small bowel diverticulosis. But no other bowel inflammation identified on this noncontrast exam. 3. Moderate to severe left hydronephrosis but with abrupt tapering at the left ureteropelvic junction suggesting UPJ stenosis.  Right greater than left nephrolithiasis but no obstructing calculus is identified. No strong evidence of pyelonephritis, although IV contrast would be necessary for that CT diagnosis. 4. Splenomegaly, which may be secondary to #1 rather than due to chronic liver disease. 5. Aortic Atherosclerosis (ICD10-I70.0). Electronically Signed   By: Genevie Ann M.D.   On: 05/08/2020 09:16   NM Renal Imaging Flow W/Pharm  Result Date: 05/10/2020 CLINICAL DATA:  Evaluate hydronephrosis of the left kidney. Status post bilobed plasty. EXAM: NUCLEAR MEDICINE RENAL SCAN WITH DIURETIC ADMINISTRATION TECHNIQUE: Radionuclide angiographic and sequential renal images were obtained after intravenous injection of radiopharmaceutical. Imaging was continued during slow intravenous injection of Lasix approximately 15 minutes after the start of the examination. RADIOPHARMACEUTICALS:  5.5 mCi Technetium-41m MAG3 IV COMPARISON:  05/08/2020 FINDINGS: Flow: Normal flow to the right kidney. Markedly diminished in delayed perfusion to the left kidney. Left renogram: Asymmetric delayed cortical uptake, excretion, and clearance of the radiopharmaceutical. On the postvoid images there is persistent radiopharmaceutical accumulation within the dilated left renal collecting system. Right renogram: Normal cortical uptake, excretion in clearance of the radiopharmaceutical. Differential: Left kidney = 23 % Right kidney = 77 % T1/2 post Lasix : Technical difficulties with Lasix administration resulted in the diuretic being administered at 80 minutes with only 10 minutes remaining. IMPRESSION: 1. Normally functioning right kidney. 2. Delayed left renal perfusion, cortical uptake, excretion and clearance of the radiopharmaceutical consistent with obstructive uropathy. Electronically Signed   By: Kerby Moors M.D.   On: 05/10/2020 14:35   DG Retrograde Pyelogram  Result Date: 05/12/2020 CLINICAL DATA:  80 year old female with a history of left-sided  hydronephrosis  with history of prior pyloroplasty EXAM: RETROGRADE PYELOGRAM COMPARISON:  CT 05/08/2020 FINDINGS: Limited fluoroscopic images during retrograde pyelogram. Images demonstrate partial opacification of the left-sided renal pelvis and collecting system with pelvicaliectasis and hydronephrosis. The visualized ureter is of relatively normal caliber. Final image demonstrates placement of a double-J ureteral stent IMPRESSION: Limited fluoroscopic spot images during retrograde pyelogram demonstrates treatment of the left collecting system with placement of a double-J stent. Please refer to the dictated operative report for full details of intraoperative findings and procedure. Electronically Signed   By: Corrie Mckusick D.O.   On: 05/12/2020 13:38   US RENAL  Result Date: 05/07/2020 CLINICAL DATA:  Pyelonephritis. EXAM: RENAL / URINARY TRACT ULTRASOUND COMPLETE COMPARISON:  None. FINDINGS: Right Kidney: Renal measurements: 11.4 x 5.1 x 5.1 cm = volume: 154.6 mL. Echogenicity within normal limits. No mass or hydronephrosis visualized. Left Kidney: Renal measurements: 10.8 x 5.3 x 6.6 cm = volume: 198.8 mL. Moderate hydronephrosis identified on the left. Bladder: Appears normal for degree of bladder distention. Other: The spleen is enlarged measuring 13.8 x 12.7 x 5.8 cm with a volume of 537 cc. IMPRESSION: 1. Moderate left hydronephrosis of uncertain chronicity. Acute/active obstruction not excluded on this study. Recommend clinical correlation. CT imaging could further evaluate for an underlying cause. 2. Splenomegaly. Electronically Signed   By: Dorise Bullion III M.D   On: 05/07/2020 16:55   DG Chest Portable 1 View  Result Date: 05/06/2020 CLINICAL DATA:  Fever, chills and cough. EXAM: PORTABLE CHEST 1 VIEW COMPARISON:  None. FINDINGS: The lungs are hyperinflated. There is no evidence of acute infiltrate, pleural effusion or pneumothorax. The heart size and mediastinal contours are within normal  limits. Multiple chronic left-sided rib fractures are seen. A radiopaque fusion plate and screws are seen within the proximal right humerus. IMPRESSION: No active cardiopulmonary disease. Electronically Signed   By: Virgina Norfolk M.D.   On: 05/06/2020 15:41

## 2020-05-14 ENCOUNTER — Telehealth: Payer: Self-pay | Admitting: Family Medicine

## 2020-05-14 ENCOUNTER — Other Ambulatory Visit: Payer: Self-pay | Admitting: Internal Medicine

## 2020-05-14 LAB — COMPREHENSIVE METABOLIC PANEL
ALT: 14 U/L (ref 0–44)
AST: 20 U/L (ref 15–41)
Albumin: 2.8 g/dL — ABNORMAL LOW (ref 3.5–5.0)
Alkaline Phosphatase: 109 U/L (ref 38–126)
Anion gap: 9 (ref 5–15)
BUN: 20 mg/dL (ref 8–23)
CO2: 25 mmol/L (ref 22–32)
Calcium: 8.1 mg/dL — ABNORMAL LOW (ref 8.9–10.3)
Chloride: 103 mmol/L (ref 98–111)
Creatinine, Ser: 0.99 mg/dL (ref 0.44–1.00)
GFR, Estimated: 58 mL/min — ABNORMAL LOW (ref >60)
Glucose, Bld: 102 mg/dL — ABNORMAL HIGH (ref 70–99)
Potassium: 4 mmol/L (ref 3.5–5.1)
Sodium: 137 mmol/L (ref 135–145)
Total Bilirubin: 0.5 mg/dL (ref 0.3–1.2)
Total Protein: 4.8 g/dL — ABNORMAL LOW (ref 6.5–8.1)

## 2020-05-14 LAB — CBC WITH DIFFERENTIAL/PLATELET
Abs Immature Granulocytes: 0 10*3/uL (ref 0.00–0.07)
Basophils Absolute: 0 10*3/uL (ref 0.0–0.1)
Basophils Relative: 0 %
Eosinophils Absolute: 0.6 10*3/uL — ABNORMAL HIGH (ref 0.0–0.5)
Eosinophils Relative: 2 %
HCT: 28.6 % — ABNORMAL LOW (ref 36.0–46.0)
Hemoglobin: 9 g/dL — ABNORMAL LOW (ref 12.0–15.0)
Lymphocytes Relative: 86 %
Lymphs Abs: 26.5 10*3/uL — ABNORMAL HIGH (ref 0.7–4.0)
MCH: 33 pg (ref 26.0–34.0)
MCHC: 31.5 g/dL (ref 30.0–36.0)
MCV: 104.8 fL — ABNORMAL HIGH (ref 80.0–100.0)
Monocytes Absolute: 0.6 10*3/uL (ref 0.1–1.0)
Monocytes Relative: 2 %
Neutro Abs: 3.1 10*3/uL (ref 1.7–7.7)
Neutrophils Relative %: 10 %
Platelets: 218 10*3/uL (ref 150–400)
RBC: 2.73 MIL/uL — ABNORMAL LOW (ref 3.87–5.11)
RDW: 15.5 % (ref 11.5–15.5)
WBC: 30.8 10*3/uL — ABNORMAL HIGH (ref 4.0–10.5)
nRBC: 0 /100 WBC
nRBC: 0.1 % (ref 0.0–0.2)

## 2020-05-14 LAB — BRAIN NATRIURETIC PEPTIDE: B Natriuretic Peptide: 100.4 pg/mL — ABNORMAL HIGH (ref 0.0–100.0)

## 2020-05-14 MED ORDER — ACETAMINOPHEN 325 MG PO TABS: 650.0000 mg | ORAL_TABLET | Freq: Four times a day (QID) | ORAL | Status: DC | PRN

## 2020-05-14 MED ORDER — POLYETHYLENE GLYCOL 3350 17 G PO PACK
17.0000 g | PACK | Freq: Once | ORAL | Status: AC
  Administered 2020-05-14: 17 g via ORAL
  Filled 2020-05-14: qty 1

## 2020-05-14 MED ORDER — CEFDINIR 300 MG PO CAPS: 600.0000 mg | ORAL_CAPSULE | Freq: Every day | ORAL | 0 refills | Status: DC

## 2020-05-14 MED ORDER — CEFDINIR 300 MG PO CAPS: 600.0000 mg | ORAL_CAPSULE | Freq: Every day | ORAL | 0 refills | Status: AC

## 2020-05-14 NOTE — TOC Transition Note (Signed)
Transition of Care [facility]) - CM/SW Discharge Note   Patient Details  Name: Sarah [name] MRN: [id number] Date of Birth: 05-04-1940  Transition of Care [facility] CM/SW Contact:  Zenon Mayo, RN Phone Number: 05/14/2020, 11:40 AM   Clinical Narrative:    NCM notified Cory with [facility] of patient's dc.   Final next level of care: [place] Barriers to Discharge: No Barriers Identified   Patient Goals and CMS Choice Patient states their goals for this hospitalization and ongoing recovery are:: get better CMS Medicare.gov Compare Post Acute Care list provided to:: Patient Choice offered to / list presented to : Patient  Discharge Placement                       Discharge Plan and Services In-house Referral: NA Discharge Planning Services: CM Consult Post Acute Care Choice: Home Health          DME Arranged: N/A DME Agency: NA       HH Arranged: PT,OT,Nurse's Aide HH Agency: [place] Date [facility] Agency Contacted: 05/10/20 Time [place]: 1115 Representative spoke with at [place]: [place] Interventions     Readmission Risk Interventions No flowsheet data found.

## 2020-05-14 NOTE — Discharge Instructions (Signed)
Follow with Primary MD Libby Maw, MD in 7 days   Get CBC, CMP, checked  by Primary MD next visit.    Activity: As tolerated with Full fall precautions use walker/cane & assistance as needed   Disposition Home    Diet: Heart Healthy .   On your next visit with your primary care physician please Get Medicines reviewed and adjusted.   Please request your Prim.MD to go over all Hospital Tests and Procedure/Radiological results at the follow up, please get all Hospital records sent to your Prim MD by signing hospital release before you go home.   If you experience worsening of your admission symptoms, develop shortness of breath, life threatening emergency, suicidal or homicidal thoughts you must seek medical attention immediately by calling 911 or calling your MD immediately  if symptoms less severe.  You Must read complete instructions/literature along with all the possible adverse reactions/side effects for all the Medicines you take and that have been prescribed to you. Take any new Medicines after you have completely understood and accpet all the possible adverse reactions/side effects.   Do not drive, operating heavy machinery, perform activities at heights, swimming or participation in water activities or provide baby sitting services if your were admitted for syncope or siezures until you have seen by Primary MD or a Neurologist and advised to do so again.  Do not drive when taking Pain medications.    Do not take more than prescribed Pain, Sleep and Anxiety Medications  Special Instructions: If you have smoked or chewed Tobacco  in the last 2 yrs please stop smoking, stop any regular Alcohol  and or any Recreational drug use.  Wear Seat belts while driving.   Please note  You were cared for by a hospitalist during your hospital stay. If you have any questions about your discharge medications or the care you received while you were in the hospital after you are  discharged, you can call the unit and asked to speak with the hospitalist on call if the hospitalist that took care of you is not available. Once you are discharged, your primary care physician will handle any further medical issues. Please note that NO REFILLS for any discharge medications will be authorized once you are discharged, as it is imperative that you return to your primary care physician (or establish a relationship with a primary care physician if you do not have one) for your aftercare needs so that they can reassess your need for medications and monitor your lab values.

## 2020-05-14 NOTE — Progress Notes (Signed)
Physical Therapy Treatment Patient Details Name: Sarah [name] MRN: [id number] DOB: [birth year] Today's Date: 05/14/2020    History of Present Illness Sarah [name] is a 80 y/o female who reported to ED with complaints of body aches, chills, fever, and runny nose. Pt was admitted on 05/06/20 and diagnosed with sepsis with UTI. PMH includes hx of UTIs, chronic lymphocytic leukemia, breast cancer in remission, HTN, HLD, gout, and GERD. On 3/23 she underwent cystoscopy with left retrograde pyelogram and left ureteral stent placement.    PT Comments    Pt received in bed, agreeable to participation in therapy. She required supervision bed mobility, supervision sit to stand, and S/min guard assist ambulation 73' with rollator. VSS on RA. Pt in recliner with feet elevated at end of session.    Follow Up Recommendations  Home health PT;Supervision for mobility/OOB     Equipment Recommendations  None recommended by PT    Recommendations for Other Services       Precautions / Restrictions Precautions Precautions: Fall    Mobility  Bed Mobility Overal bed mobility: Needs Assistance Bed Mobility: Supine to Sit     Supine to sit: Supervision;HOB elevated     General bed mobility comments: supervision for safety, +rail, increased time    Transfers Overall transfer level: Needs assistance Equipment used: 4-wheeled walker Transfers: Sit to/from Stand Sit to Stand: Supervision         General transfer comment: supervision for safety  Ambulation/Gait Ambulation/Gait assistance: Min guard;Supervision Gait Distance (Feet): 75 Feet Assistive device: 4-wheeled walker Gait Pattern/deviations: Step-through pattern;Decreased stride length;Trunk flexed Gait velocity: decreased Gait velocity interpretation: <1.31 ft/sec, indicative of household ambulator General Gait Details: distance limited by BLE pain   Stairs             Wheelchair Mobility    Modified Rankin (Stroke Patients  Only)       Balance Overall balance assessment: Needs assistance Sitting-balance support: No upper extremity supported;Feet supported Sitting balance-Leahy Scale: Good     Standing balance support: Bilateral upper extremity supported;During functional activity;No upper extremity supported Standing balance-Leahy Scale: Fair Standing balance comment: static stand without UE support, rollator for amb                            Cognition Arousal/Alertness: Awake/alert Behavior During Therapy: WFL for tasks assessed/performed Overall Cognitive Status: Within Functional Limits for tasks assessed                                        Exercises      General Comments General comments (skin integrity, edema, etc.): VSS on RA      Pertinent Vitals/Pain Pain Assessment: Faces Faces Pain Scale: Hurts a little bit Pain Location: BLE during ambulation Pain Descriptors / Indicators: Sore;Discomfort Pain Intervention(s): Limited activity within patient's tolerance;Repositioned    Home Living                      Prior Function            PT Goals (current goals can now be found in the care plan section) Acute Rehab PT Goals Patient Stated Goal: home Progress towards PT goals: Progressing toward goals    Frequency    Min 3X/week      PT Plan Current plan remains appropriate    Co-evaluation  AM-PAC PT "6 Clicks" Mobility   Outcome Measure  Help needed turning from your back to your side while in a flat bed without using bedrails?: None Help needed moving from lying on your back to sitting on the side of a flat bed without using bedrails?: A Little Help needed moving to and from a bed to a chair (including a wheelchair)?: A Little Help needed standing up from a chair using your arms (e.g., wheelchair or bedside chair)?: A Little Help needed to walk in hospital room?: A Little Help needed climbing 3-5 steps with a  railing? : A Little 6 Click Score: 19    End of Session Equipment Utilized During Treatment: Gait belt Activity Tolerance: Patient tolerated treatment well Patient left: in chair;with call bell/phone within reach;with chair alarm set Nurse Communication: Mobility status PT Visit Diagnosis: Unsteadiness on feet (R26.81);Muscle weakness (generalized) (M62.81)     Time: 1540-0867 PT Time Calculation (min) (ACUTE ONLY): 23 min  Charges:  $Gait Training: 23-37 mins                     Lorrin Goodell, [place]  Office # [phone] Pager [phone]    Lorriane Shire 05/14/2020, 9:17 AM

## 2020-05-14 NOTE — Discharge Summary (Signed)
Sarah [name], is a 80 y.o. female  DOB 12/14/1940  MRN [id number].  Admission date:  05/06/2020  Admitting Physician  Thurnell Lose, MD  Discharge Date:  05/14/2020   Primary MD  Libby Maw, MD  Recommendations for primary care physician for things to follow:  -Please check CBC, CMP during next visit -Patient to follow-up with OUTPATIENT.  Admission Diagnosis  Acute cystitis without hematuria [N30.00] AKI (acute kidney injury) ([place]) [N17.9] Hypotension, unspecified hypotension type [I95.9]   Discharge Diagnosis  Acute cystitis without hematuria [N30.00] AKI (acute kidney injury) ([place]) [N17.9] Hypotension, unspecified hypotension type [I95.9]    Principal Problem:   UTI (urinary tract infection) Active Problems:   Elevated cholesterol   CLL (chronic lymphocytic leukemia) ([facility])   AKI (acute kidney injury) ([place])   Hypotension      Past Medical History:  Diagnosis Date  . Allergy   . CLL (chronic lymphocytic leukemia) ([place])   . GERD (gastroesophageal reflux disease)   . Gout   . HLD (hyperlipidemia)   . Hypertension     Past Surgical History:  Procedure Laterality Date  . ABDOMINAL HYSTERECTOMY    . APPENDECTOMY    . CHOLECYSTECTOMY    . CYSTOSCOPY WITH STENT PLACEMENT Left 05/12/2020   Procedure: CYSTOSCOPY WITH STENT PLACEMENT;  Surgeon: Lucas Mallow, MD;  Location: [facility];  Service: Urology;  Laterality: Left;  . KIDNEY SURGERY    . MOHS SURGERY         History of present illness and  Hospital Course:     Kindly see H&P for history of present illness and admission details, please review complete Labs, Consult reports and Test reports for all details in brief  HPI  from the history and physical done on the day of admission 05/06/2020   HPI: Sarah [name] is a 80 y.o. female with medical history significant of CLL on observation, breast cancer in  remission, hypertension, hyperlipidemia, gout, GERD presented to [facility] today with symptoms consistent with a viral illness for the past 3 days.  Patient was hypotensive at urgent care with systolic 89 and sent to [facility] ED.  Blood pressure soft in the ED. Not febrile, tachycardic, or hypoxic.  Labs showing WBC 41.0 with lymphocytosis (chronically elevated given history of CLL), hemoglobin 10.0 (was 10.8 on 04/19/2020), platelet count 214K.  Sodium 137, potassium 4.6, chloride 107, bicarb 20, anion gap 10, BUN 31, creatinine 1.4 (baseline 0.9), glucose 125.  Alk phos chronically elevated, remainder of LFTs normal.  Lactic acid 1.1.  UA with negative nitrite, large amount of leukocytes, greater than 50 WBCs, and few bacteria.  Urine culture pending.  SARS-CoV-2 PCR test negative.  Influenza panel negative.  Blood culture x2 pending.  Chest x-ray showing no active cardiopulmonary disease. Patient was given ceftriaxone and a 500 cc normal saline bolus.  Patient reports 3 day history of chills, night sweats, rhinorrhea, cough, shortness of breath, fatigue, and poor appetite.  States she normally gets night sweats due to CLL but much  worse now.  Her temperature has been running between 98-99 degrees Fahrenheit at home.  Denies chest pain, nausea, vomiting, abdominal pain, or diarrhea.  Patient states she has had 3 UTIs in the past 4 months and was seen by a urologist and started on Bactrim.  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  Denies dysuria. She is vaccinated against COVID including booster shot.  She has also received flu vaccine this season.    Hospital Course   Sepsis with UTI  -Sepsis present on admission, - Recurrent UTIs   with 4-5 episodes in the last 2 months, she was treated with IV Rocephin during hospital stay, inpatient treatment, plan to discharge home on [place] for one more day. extra day -Blood cultures with no growth to date . 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 - sepsis pathophysiology has resolved.    AKI and Hypotension  -improved with IV fluids, improved with IV antibiotics  CLL - post DC follow with Dr Lorenso Courier.  Dyslipidemia - on Zetia.  GERD - PPI.   Depression - home Rx.   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   HTN -resume home medications on discharge.   Discharge Condition:  stable   Follow UP   Follow-up Information    Libby Maw, MD Follow up in 1 week(s).   Specialty: Family Medicine Contact information: [place] [address] [phone]                 Discharge Instructions  and  Discharge Medications    Discharge Instructions    Diet - low sodium heart healthy   Complete by: As directed    Discharge instructions   Complete by: As directed    Follow with Primary MD Libby Maw, MD in 7 days   Get CBC, CMP, checked  by Primary MD next visit.    Activity: As tolerated with Full fall precautions use walker/cane & assistance as needed   Disposition Home    Diet: Heart Healthy .   On your next visit with your primary care physician please Get Medicines reviewed and adjusted.   Please request your Prim.MD to go over all Hospital Tests and Procedure/Radiological results at the follow up, please get all Hospital records sent to your Prim MD by signing hospital release before you go home.   If you experience worsening of your admission symptoms, develop shortness of breath, life threatening emergency, suicidal or homicidal thoughts you must seek medical attention immediately by calling 911 or calling your MD immediately  if symptoms less severe.  You Must read complete instructions/literature along with all the possible adverse reactions/side effects for all the Medicines you take and that have been prescribed to you. Take any new Medicines after you  have completely understood and accpet all the possible adverse reactions/side effects.   Do not drive, operating heavy machinery, perform activities at heights, swimming or participation in water activities or provide baby sitting services if your were admitted for syncope or siezures until you have seen by Primary MD or a Neurologist and advised to do so again.  Do not drive when taking Pain medications.    Do not take more than prescribed Pain, Sleep and Anxiety Medications  Special Instructions: If you have smoked or chewed Tobacco  in the last 2 yrs please stop smoking, stop any regular Alcohol  and or any Recreational drug use.  Wear Seat belts while driving.   Please note  You were cared for by a hospitalist during your hospital stay. If you have any questions about your discharge medications or the care you received while you were in the hospital after you are discharged, you can call the unit and asked to speak with the hospitalist on call if the hospitalist that took care of you is not available. Once you are discharged, your primary care physician will handle any further medical issues. Please note that NO REFILLS for any discharge medications will be authorized once you are discharged, as it is imperative that you return to your primary care physician (or establish a relationship with a primary care physician if you do not have one) for your aftercare needs so that they can reassess your need for medications and monitor your lab values.   Increase activity slowly   Complete by: As directed      Allergies as of 05/14/2020      Reactions   Amoxicillin    Augmentin [amoxicillin-pot Clavulanate]    GI upset   Drug [tape]    Sulfa Antibiotics    Childhood    Atenolol Rash   Macrodantin [nitrofurantoin] Rash      Medication List    STOP taking these medications   ibuprofen 200 MG tablet Commonly known as: ADVIL     TAKE these medications   acetaminophen 325 MG  tablet Commonly known as: TYLENOL Take 2 tablets (650 mg total) by mouth every 6 (six) hours as needed for mild pain (or Fever >/= 101). What changed:   medication strength  how much to take  when to take this  reasons to take this   busPIRone 15 MG tablet Commonly known as: BUSPAR Take 1 tablet (15 mg total) by mouth 2 (two) times daily.   cefdinir 300 MG capsule Commonly known as: OMNICEF Take 2 capsules (600 mg total) by mouth daily for 1 day. Start taking on: May 15, 2020   Cranberry 500 MG Tabs Take 1 tablet by mouth daily.   EQ Multivitamins Adult Gummy Chew Chew 1 tablet by mouth in the morning.   ezetimibe 10 MG tablet Commonly known as: ZETIA Take 10 mg by mouth daily.   fluticasone 50 MCG/ACT nasal spray Commonly known as: FLONASE Place 2 sprays into both nostrils daily. What changed:   when to take this  reasons to take this   glucosamine-chondroitin 500-400 MG tablet Take 1 tablet by mouth daily.   guaifenesin 400 MG Tabs tablet Commonly known as: HUMIBID E Take 400 mg by mouth every 4 (four) hours as needed (cough).   lansoprazole 30 MG capsule Commonly known as: PREVACID Take 30 mg by mouth daily at 12 noon.   loperamide 2 MG capsule Commonly known as: IMODIUM Take by mouth daily as needed for diarrhea or loose stools.   loratadine 10 MG tablet Commonly known as: CLARITIN Take 10 mg by mouth daily as needed for allergies.   Melatonin 5 MG Chew Chew 1 tablet by mouth at bedtime as needed (sleep).   metoprolol-hydrochlorothiazide 100-25 MG tablet Commonly known as: LOPRESSOR HCT Take 0.5 tablets by mouth 2 (two) times daily.   potassium chloride 10 MEQ tablet Commonly known as: KLOR-CON Take 10 mEq by mouth daily.   trimethoprim 100 MG tablet Commonly known as: TRIMPEX Take 100 mg by mouth at bedtime.   venlafaxine XR 150 MG 24 hr capsule Commonly known  as: EFFEXOR-XR Take 150 mg by mouth daily with breakfast.          Diet and Activity recommendation: See Discharge Instructions above   Consults obtained -  urology   Major procedures and Radiology Reports - PLEASE review detailed and final reports for all details, in brief -      CT ABDOMEN PELVIS WO CONTRAST  Result Date: 05/08/2020 CLINICAL DATA:  80 year old female with pyelonephritis. EXAM: CT ABDOMEN AND PELVIS WITHOUT CONTRAST TECHNIQUE: Multidetector CT imaging of the abdomen and pelvis was performed following the standard protocol without IV contrast. COMPARISON:  Renal ultrasound yesterday. FINDINGS: Lower chest: Mild cardiomegaly. Tortuous descending thoracic aorta. No pericardial effusion. Minimal lung base atelectasis. Hepatobiliary: Mildly nodular liver contour. Circumscribed round low-density area measuring 12 mm on series 3, image 21 has simple fluid density and is likely a benign cyst. No other discrete liver lesion on this noncontrast exam. Diminutive or absent gallbladder. Pancreas: Negative noncontrast pancreas. Spleen: Splenomegaly. Estimated splenic volume 535 mL (normal splenic volume range 83 - 412 mL). No discrete splenic lesion. Adrenals/Urinary Tract: Negative adrenal glands. Right kidney appears nonobstructed although there is a round 9 mm calculus in the right renal pelvis (series 6, image 82). Right ureter is decompressed. The distal ureter is difficult to delineate but appears to remain decompressed to the to the bladder. There is moderate to severe left hydronephrosis. But the left ureteropelvic junction appears abruptly tapered on coronal image 79. Only a punctate left intrarenal calculus is identified. No left hydroureter is evident. The left UVJ appears normal. Diminutive urinary bladder, with mild bladder wall thickening in the region of sigmoid colon inflammatory stranding. See below. Numerous pelvic phleboliths. Stomach/Bowel: Retained stool in the rectum with no rectal wall thickening or inflammation identified.  However, the upstream sigmoid colon is thickened and with mild mesenteric stranding as seen on coronal image 68 which extends to the nearby bladder dome. There are diverticula in the region. This inflammation is in a segment of fiber 6 cm. Diverticulosis in the upstream descending colon without active inflammation. Mild retained stool proximal to the sigmoid. Diminutive or absent appendix. Terminal ileum also remarkable for fairly extensive diverticulosis which tracks into the upstream distal small bowel. No dilated small bowel. No free air or free fluid. No other convincing acute bowel inflammation. Stomach and duodenum are decompressed. Vascular/Lymphatic: Aortoiliac calcified atherosclerosis. Tortuous abdominal aorta. Vascular patency is not evaluated in the absence of IV contrast. Additionally, there is nonspecific appearing lymphadenopathy in the small bowel mesentery (coronal images 54-59) with individual lymph nodes up to 13 mm short axis. Associated haziness of the root of the mesentery. Furthermore, there is bulky left side retroperitoneal lymphadenopathy, with individual pararenal nodes up to 23 mm short axis. The lymphadenopathy abates at the pelvic inlet. No inguinal lymphadenopathy. Reproductive: Absent uterus.  Diminutive or absent ovaries. Other: Mild presacral stranding is nonspecific. No pelvic free fluid. Musculoskeletal: Several chronic left posterior rib fractures. Grade 1 degenerative lower lumbar spondylolisthesis with severe facet degeneration. Severe chronic disc degeneration at the lumbosacral junction. No acute osseous abnormality identified. IMPRESSION: 1. Moderate to severe retroperitoneal and mesenteric lymphadenopathy suspicious for Lymphoma, Leukemia, or less likely other lymphoproliferative disorder. A CT guided left pararenal lymph node needle biopsy should be feasible. 2. Sigmoid colon appears inflamed compatible with acute diverticulitis or colitis. Mild secondary inflammation of  the nearby Bladder Dome. No evidence of colovesical fistula or abscess. Underlying extensive small bowel diverticulosis. But no other bowel inflammation identified on this noncontrast  exam. 3. Moderate to severe left hydronephrosis but with abrupt tapering at the left ureteropelvic junction suggesting UPJ stenosis. Right greater than left nephrolithiasis but no obstructing calculus is identified. No strong evidence of pyelonephritis, although IV contrast would be necessary for that CT diagnosis. 4. Splenomegaly, which may be secondary to #1 rather than due to chronic liver disease. 5. Aortic Atherosclerosis (ICD10-I70.0). Electronically Signed   By: Genevie Ann M.D.   On: 05/08/2020 09:16   NM Renal Imaging Flow W/Pharm  Result Date: 05/10/2020 CLINICAL DATA:  Evaluate hydronephrosis of the left kidney. Status post bilobed plasty. EXAM: NUCLEAR MEDICINE RENAL SCAN WITH DIURETIC ADMINISTRATION TECHNIQUE: Radionuclide angiographic and sequential renal images were obtained after intravenous injection of radiopharmaceutical. Imaging was continued during slow intravenous injection of Lasix approximately 15 minutes after the start of the examination. RADIOPHARMACEUTICALS:  5.5 mCi Technetium-68mMAG3 IV COMPARISON:  05/08/2020 FINDINGS: Flow: Normal flow to the right kidney. Markedly diminished in delayed perfusion to the left kidney. Left renogram: Asymmetric delayed cortical uptake, excretion, and clearance of the radiopharmaceutical. On the postvoid images there is persistent radiopharmaceutical accumulation within the dilated left renal collecting system. Right renogram: Normal cortical uptake, excretion in clearance of the radiopharmaceutical. Differential: Left kidney = 23 % Right kidney = 77 % T1/2 post Lasix : Technical difficulties with Lasix administration resulted in the diuretic being administered at 80 minutes with only 10 minutes remaining. IMPRESSION: 1. Normally functioning right kidney. 2. Delayed left  renal perfusion, cortical uptake, excretion and clearance of the radiopharmaceutical consistent with obstructive uropathy. Electronically Signed   By: TKerby MoorsM.D.   On: 05/10/2020 14:35   DG Retrograde Pyelogram  Result Date: 05/12/2020 CLINICAL DATA:  80year old female with a history of left-sided hydronephrosis with history of prior pyloroplasty EXAM: RETROGRADE PYELOGRAM COMPARISON:  CT 05/08/2020 FINDINGS: Limited fluoroscopic images during retrograde pyelogram. Images demonstrate partial opacification of the left-sided renal pelvis and collecting system with pelvicaliectasis and hydronephrosis. The visualized ureter is of relatively normal caliber. Final image demonstrates placement of a double-J ureteral stent IMPRESSION: Limited fluoroscopic spot images during retrograde pyelogram demonstrates treatment of the left collecting system with placement of a double-J stent. Please refer to the dictated operative report for full details of intraoperative findings and procedure. Electronically Signed   By: JCorrie MckusickD.O.   On: 05/12/2020 13:38   UK[place]RENAL  Result Date: 05/07/2020 CLINICAL DATA:  Pyelonephritis. EXAM: RENAL / URINARY TRACT ULTRASOUND COMPLETE COMPARISON:  None. FINDINGS: Right Kidney: Renal measurements: 11.4 x 5.1 x 5.1 cm = volume: 154.6 mL. Echogenicity within normal limits. No mass or hydronephrosis visualized. Left Kidney: Renal measurements: 10.8 x 5.3 x 6.6 cm = volume: 198.8 mL. Moderate hydronephrosis identified on the left. Bladder: Appears normal for degree of bladder distention. Other: The spleen is enlarged measuring 13.8 x 12.7 x 5.8 cm with a volume of 537 cc. IMPRESSION: 1. Moderate left hydronephrosis of uncertain chronicity. Acute/active obstruction not excluded on this study. Recommend clinical correlation. CT imaging could further evaluate for an underlying cause. 2. Splenomegaly. Electronically Signed   By: DDorise BullionIII M.D   On: 05/07/2020 16:55   DG  Chest Portable 1 View  Result Date: 05/06/2020 CLINICAL DATA:  Fever, chills and cough. EXAM: PORTABLE CHEST 1 VIEW COMPARISON:  None. FINDINGS: The lungs are hyperinflated. There is no evidence of acute infiltrate, pleural effusion or pneumothorax. The heart size and mediastinal contours are within normal limits. Multiple chronic left-sided rib fractures are seen. A radiopaque  fusion plate and screws are seen within the proximal right humerus. IMPRESSION: No active cardiopulmonary disease. Electronically Signed   By: Virgina Norfolk M.D.   On: 05/06/2020 15:41    Micro Results     Recent Results (from the past 240 hour(s))  Covid-19, Flu A+B ([facility])     Status: None   Collection Time: 05/06/20  2:11 PM   Specimen: Nasopharyngeal   Naso  Result Value Ref Range Status   SARS-CoV-2, NAA Not Detected Not Detected Final    Comment: This nucleic acid amplification test was developed and its performance characteristics determined by [employer], [employer]. Nucleic acid amplification tests include RT-PCR and TMA. This test has not been FDA cleared or approved. This test has been authorized by FDA under an Emergency Use Authorization (EUA). This test is only authorized for the duration of time the declaration that circumstances exist justifying the authorization of the emergency use of in vitro diagnostic tests for detection of SARS-CoV-2 virus and/or diagnosis of COVID-19 infection under section 564(b)(1) of the Act, 21 U.S.C. 470JGG-8(Z) (1), unless the authorization is terminated or revoked sooner. When diagnostic testing is negative, the possibility of a false negative result should be considered in the context of a patient's recent exposures and the presence of clinical signs and symptoms consistent with COVID-19. An individual without symptoms of COVID-19 and who is not shedding SARS-CoV-2 virus wo uld expect to have a negative (not detected) result in this assay.    Influenza A,  NAA Not Detected Not Detected Final   Influenza B, NAA Not Detected Not Detected Final  Resp Panel by RT-PCR (Flu A&B, Covid) Nasopharyngeal Swab     Status: None   Collection Time: 05/06/20  3:18 PM   Specimen: Nasopharyngeal Swab; Nasopharyngeal(NP) swabs in vial transport medium  Result Value Ref Range Status   SARS Coronavirus 2 by RT PCR NEGATIVE NEGATIVE Final    Comment: (NOTE) SARS-CoV-2 target nucleic acids are NOT DETECTED.  The SARS-CoV-2 RNA is generally detectable in upper respiratory specimens during the acute phase of infection. The lowest concentration of SARS-CoV-2 viral copies this assay can detect is 138 copies/mL. A negative result does not preclude SARS-Cov-2 infection and should not be used as the sole basis for treatment or other patient management decisions. A negative result may occur with  improper specimen collection/handling, submission of specimen other than nasopharyngeal swab, presence of viral mutation(s) within the areas targeted by this assay, and inadequate number of viral copies(<138 copies/mL). A negative result must be combined with clinical observations, patient history, and epidemiological information. The expected result is Negative.  Fact Sheet for Patients:  EntrepreneurPulse.com.au  Fact Sheet for Healthcare Providers:  IncredibleEmployment.be  This test is no t yet approved or cleared by the [place] FDA and  has been authorized for detection and/or diagnosis of SARS-CoV-2 by FDA under an Emergency Use Authorization (EUA). This EUA will remain  in effect (meaning this test can be used) for the duration of the COVID-19 declaration under Section 564(b)(1) of the Act, 21 U.S.C.section 360bbb-3(b)(1), unless the authorization is terminated  or revoked sooner.       Influenza A by PCR NEGATIVE NEGATIVE Final   Influenza B by PCR NEGATIVE NEGATIVE Final    Comment: (NOTE) The Xpert Xpress  SARS-CoV-2/FLU/RSV plus assay is intended as an aid in the diagnosis of influenza from Nasopharyngeal swab specimens and should not be used as a sole basis for treatment. Nasal washings and aspirates are unacceptable for Xpert  Xpress SARS-CoV-2/FLU/RSV testing.  Fact Sheet for Patients: EntrepreneurPulse.com.au  Fact Sheet for Healthcare Providers: IncredibleEmployment.be  This test is not yet approved or cleared by the [place] FDA and has been authorized for detection and/or diagnosis of SARS-CoV-2 by FDA under an Emergency Use Authorization (EUA). This EUA will remain in effect (meaning this test can be used) for the duration of the COVID-19 declaration under Section 564(b)(1) of the Act, 21 U.S.C. section 360bbb-3(b)(1), unless the authorization is terminated or revoked.  Performed at [facility]   Culture, blood (routine x 2)     Status: None   Collection Time: 05/06/20  3:20 PM   Specimen: BLOOD  Result Value Ref Range Status   Specimen Description   Final    BLOOD Performed at [facility]    Special Requests   Final    LEFT ANTECUBITAL Blood Culture adequate volume Performed at [facility]    Culture   Final    NO GROWTH 5 DAYS Performed at [facility], [place] [address] [place], [place] [address]    Report Status 05/12/2020 FINAL  Final  Culture, blood (routine x 2)     Status: None   Collection Time: 05/06/20  4:50 PM   Specimen: BLOOD  Result Value Ref Range Status   Specimen Description   Final    BLOOD Performed at [facility]    Special Requests   Final    RIGHT ANTECUBITAL Blood Culture adequate volume Performed at [facility]    Culture   Final    NO GROWTH 5 DAYS Performed at [facility], [place] [address] [place], [place] [address]    Report Status 05/12/2020 FINAL  Final       Today   Subjective:    Sarah [name] today has no headache,no chest or abdominal pain,no new weakness tingling or numbness, feels much better wants to go home today, no dysuria.  Objective:   Blood pressure 125/60, pulse 91, temperature 98.9 F (37.2 C), temperature source Oral, resp. rate 20, height 5' 3"  (1.6 m), weight 76.2 kg, SpO2 94 %.   Intake/Output Summary (Last 24 hours) at 05/14/2020 1043 Last data filed at 05/14/2020 0321 Gross per 24 hour  Intake --  Output 400 ml  Net -400 ml    Exam Awake Alert, Oriented x 3, No new F.N deficits, Normal affect Symmetrical Chest wall movement, Good air movement bilaterally, CTAB RRR,No Gallops,Rubs or new Murmurs, No Parasternal Heave +ve B.Sounds, Abd Soft, Non tender, No rebound -guarding or rigidity. No Cyanosis, Clubbing or edema, No new Rash or bruise  Data Review   CBC w Diff:  Lab Results  Component Value Date   WBC 30.8 (H) 05/14/2020   HGB 9.0 (L) 05/14/2020   HGB 10.8 (L) 04/19/2020   HCT 28.6 (L) 05/14/2020   PLT 218 05/14/2020   PLT 157 04/19/2020   LYMPHOPCT 86 05/14/2020   MONOPCT 2 05/14/2020   EOSPCT 2 05/14/2020   BASOPCT 0 05/14/2020    CMP:  Lab Results  Component Value Date   NA 137 05/14/2020   K 4.0 05/14/2020   CL 103 05/14/2020   CO2 25 05/14/2020   BUN 20 05/14/2020   CREATININE 0.99 05/14/2020   CREATININE 1.15 (H) 04/19/2020   PROT 4.8 (L) 05/14/2020   ALBUMIN 2.8 (L) 05/14/2020   BILITOT 0.5 05/14/2020   BILITOT 0.4 04/19/2020   ALKPHOS 109 05/14/2020  AST 20 05/14/2020   AST 22 04/19/2020   ALT 14 05/14/2020   ALT 18 04/19/2020  .   Total Time in preparing paper work, data evaluation and todays exam - 86 minutes  Phillips Climes M.D on 05/14/2020 at 10:43 AM  [facility]   Office  [phone]

## 2020-05-14 NOTE — Telephone Encounter (Signed)
Pt's daughter called in to make her an appointment with Dr. Ethelene Hal. She then let me know her mom had a temperature of 100.1. She was going to give her a Tylenol and then start her on her antibiotic. I let her know she should take her to an urgent care or the ED if her temperature starts going up.

## 2020-05-14 NOTE — Progress Notes (Signed)
Patient discharged home. Discharge instructions explained, pt verbalizes understanding.

## 2020-05-17 ENCOUNTER — Telehealth: Payer: Self-pay

## 2020-05-17 ENCOUNTER — Inpatient Hospital Stay (HOSPITAL_BASED_OUTPATIENT_CLINIC_OR_DEPARTMENT_OTHER)
Admission: EM | Admit: 2020-05-17 | Discharge: 2020-05-20 | DRG: 872 | Disposition: A | Discharge status: Home Health | Payer: Medicare Other | Attending: Student | Admitting: Student

## 2020-05-17 ENCOUNTER — Emergency Department (HOSPITAL_BASED_OUTPATIENT_CLINIC_OR_DEPARTMENT_OTHER): Payer: Medicare Other

## 2020-05-17 ENCOUNTER — Inpatient Hospital Stay (HOSPITAL_COMMUNITY): Payer: Medicare Other

## 2020-05-17 ENCOUNTER — Encounter (HOSPITAL_BASED_OUTPATIENT_CLINIC_OR_DEPARTMENT_OTHER): Payer: Self-pay | Admitting: Obstetrics and Gynecology

## 2020-05-17 ENCOUNTER — Other Ambulatory Visit: Payer: Self-pay

## 2020-05-17 DIAGNOSIS — F419 Anxiety disorder, unspecified: Secondary | ICD-10-CM | POA: Diagnosis present

## 2020-05-17 DIAGNOSIS — Z888 Allergy status to other drugs, medicaments and biological substances status: Secondary | ICD-10-CM | POA: Diagnosis not present

## 2020-05-17 DIAGNOSIS — E785 Hyperlipidemia, unspecified: Secondary | ICD-10-CM | POA: Diagnosis present

## 2020-05-17 DIAGNOSIS — D539 Nutritional anemia, unspecified: Secondary | ICD-10-CM | POA: Diagnosis present

## 2020-05-17 DIAGNOSIS — Z6829 Body mass index (BMI) 29.0-29.9, adult: Secondary | ICD-10-CM | POA: Diagnosis not present

## 2020-05-17 DIAGNOSIS — A419 Sepsis, unspecified organism: Secondary | ICD-10-CM | POA: Diagnosis not present

## 2020-05-17 DIAGNOSIS — Z66 Do not resuscitate: Secondary | ICD-10-CM | POA: Diagnosis present

## 2020-05-17 DIAGNOSIS — Z960 Presence of urogenital implants: Secondary | ICD-10-CM

## 2020-05-17 DIAGNOSIS — Z881 Allergy status to other antibiotic agents status: Secondary | ICD-10-CM | POA: Diagnosis not present

## 2020-05-17 DIAGNOSIS — Z79899 Other long term (current) drug therapy: Secondary | ICD-10-CM | POA: Diagnosis not present

## 2020-05-17 DIAGNOSIS — E663 Overweight: Secondary | ICD-10-CM | POA: Diagnosis present

## 2020-05-17 DIAGNOSIS — Z853 Personal history of malignant neoplasm of breast: Secondary | ICD-10-CM | POA: Diagnosis not present

## 2020-05-17 DIAGNOSIS — K219 Gastro-esophageal reflux disease without esophagitis: Secondary | ICD-10-CM | POA: Diagnosis present

## 2020-05-17 DIAGNOSIS — N136 Pyonephrosis: Secondary | ICD-10-CM | POA: Diagnosis present

## 2020-05-17 DIAGNOSIS — M109 Gout, unspecified: Secondary | ICD-10-CM | POA: Diagnosis present

## 2020-05-17 DIAGNOSIS — D72820 Lymphocytosis (symptomatic): Secondary | ICD-10-CM | POA: Diagnosis not present

## 2020-05-17 DIAGNOSIS — F32A Depression, unspecified: Secondary | ICD-10-CM | POA: Diagnosis present

## 2020-05-17 DIAGNOSIS — Z882 Allergy status to sulfonamides status: Secondary | ICD-10-CM

## 2020-05-17 DIAGNOSIS — N390 Urinary tract infection, site not specified: Secondary | ICD-10-CM | POA: Diagnosis present

## 2020-05-17 DIAGNOSIS — R5383 Other fatigue: Secondary | ICD-10-CM | POA: Diagnosis not present

## 2020-05-17 DIAGNOSIS — I10 Essential (primary) hypertension: Secondary | ICD-10-CM | POA: Diagnosis present

## 2020-05-17 DIAGNOSIS — A4181 Sepsis due to Enterococcus: Principal | ICD-10-CM | POA: Diagnosis present

## 2020-05-17 DIAGNOSIS — Z1621 Resistance to vancomycin: Secondary | ICD-10-CM | POA: Diagnosis not present

## 2020-05-17 DIAGNOSIS — R6520 Severe sepsis without septic shock: Secondary | ICD-10-CM | POA: Diagnosis present

## 2020-05-17 DIAGNOSIS — E876 Hypokalemia: Secondary | ICD-10-CM | POA: Diagnosis present

## 2020-05-17 DIAGNOSIS — Z20822 Contact with and (suspected) exposure to covid-19: Secondary | ICD-10-CM | POA: Diagnosis present

## 2020-05-17 DIAGNOSIS — C9110 Chronic lymphocytic leukemia of B-cell type not having achieved remission: Secondary | ICD-10-CM | POA: Diagnosis present

## 2020-05-17 DIAGNOSIS — Z880 Allergy status to penicillin: Secondary | ICD-10-CM | POA: Diagnosis not present

## 2020-05-17 DIAGNOSIS — R509 Fever, unspecified: Secondary | ICD-10-CM

## 2020-05-17 DIAGNOSIS — Z91048 Other nonmedicinal substance allergy status: Secondary | ICD-10-CM

## 2020-05-17 DIAGNOSIS — R531 Weakness: Secondary | ICD-10-CM | POA: Diagnosis not present

## 2020-05-17 DIAGNOSIS — R6510 Systemic inflammatory response syndrome (SIRS) of non-infectious origin without acute organ dysfunction: Secondary | ICD-10-CM

## 2020-05-17 DIAGNOSIS — B952 Enterococcus as the cause of diseases classified elsewhere: Secondary | ICD-10-CM | POA: Diagnosis not present

## 2020-05-17 LAB — LACTIC ACID, PLASMA
Lactic Acid, Venous: 2.7 mmol/L (ref 0.5–1.9)
Lactic Acid, Venous: 3.3 mmol/L (ref 0.5–1.9)

## 2020-05-17 LAB — PROTIME-INR
INR: 1 (ref 0.8–1.2)
Prothrombin Time: 12.8 seconds (ref 11.4–15.2)

## 2020-05-17 LAB — RESP PANEL BY RT-PCR (FLU A&B, COVID) ARPGX2
Influenza A by PCR: NEGATIVE
Influenza B by PCR: NEGATIVE
SARS Coronavirus 2 by RT PCR: NEGATIVE

## 2020-05-17 LAB — CBC WITH DIFFERENTIAL/PLATELET
Abs Immature Granulocytes: 0 10*3/uL (ref 0.00–0.07)
Basophils Absolute: 0 10*3/uL (ref 0.0–0.1)
Basophils Relative: 0 %
Eosinophils Absolute: 0.5 10*3/uL (ref 0.0–0.5)
Eosinophils Relative: 1 %
HCT: 29.3 % — ABNORMAL LOW (ref 36.0–46.0)
Hemoglobin: 9.1 g/dL — ABNORMAL LOW (ref 12.0–15.0)
Lymphocytes Relative: 78 %
Lymphs Abs: 39.5 10*3/uL — ABNORMAL HIGH (ref 0.7–4.0)
MCH: 32.3 pg (ref 26.0–34.0)
MCHC: 31.1 g/dL (ref 30.0–36.0)
MCV: 103.9 fL — ABNORMAL HIGH (ref 80.0–100.0)
Monocytes Absolute: 1.5 10*3/uL — ABNORMAL HIGH (ref 0.1–1.0)
Monocytes Relative: 3 %
Neutro Abs: 9.1 10*3/uL — ABNORMAL HIGH (ref 1.7–7.7)
Neutrophils Relative %: 18 %
Platelets: 225 10*3/uL (ref 150–400)
RBC: 2.82 MIL/uL — ABNORMAL LOW (ref 3.87–5.11)
RDW: 16.1 % — ABNORMAL HIGH (ref 11.5–15.5)
WBC: 50.6 10*3/uL (ref 4.0–10.5)
nRBC: 0 % (ref 0.0–0.2)

## 2020-05-17 LAB — COMPREHENSIVE METABOLIC PANEL
ALT: 14 U/L (ref 0–44)
AST: 30 U/L (ref 15–41)
Albumin: 3.7 g/dL (ref 3.5–5.0)
Alkaline Phosphatase: 118 U/L (ref 38–126)
Anion gap: 12 (ref 5–15)
BUN: 17 mg/dL (ref 8–23)
CO2: 26 mmol/L (ref 22–32)
Calcium: 8.6 mg/dL — ABNORMAL LOW (ref 8.9–10.3)
Chloride: 99 mmol/L (ref 98–111)
Creatinine, Ser: 0.97 mg/dL (ref 0.44–1.00)
GFR, Estimated: 59 mL/min — ABNORMAL LOW (ref >60)
Glucose, Bld: 113 mg/dL — ABNORMAL HIGH (ref 70–99)
Potassium: 4.7 mmol/L (ref 3.5–5.1)
Sodium: 137 mmol/L (ref 135–145)
Total Bilirubin: 0.6 mg/dL (ref 0.3–1.2)
Total Protein: 5.7 g/dL — ABNORMAL LOW (ref 6.5–8.1)

## 2020-05-17 LAB — URINALYSIS, ROUTINE W REFLEX MICROSCOPIC
Bilirubin Urine: NEGATIVE
Glucose, UA: NEGATIVE mg/dL
Ketones, ur: NEGATIVE mg/dL
Nitrite: NEGATIVE
Protein, ur: 30 mg/dL — AB
Specific Gravity, Urine: 1.022 (ref 1.005–1.030)
pH: 6 (ref 5.0–8.0)

## 2020-05-17 LAB — APTT: aPTT: 34 seconds (ref 24–36)

## 2020-05-17 MED ORDER — OXYCODONE HCL 5 MG PO TABS: 5.0000 mg | ORAL_TABLET | ORAL | Status: DC | PRN

## 2020-05-17 MED ORDER — SENNA 8.6 MG PO TABS
1.0000 | ORAL_TABLET | Freq: Two times a day (BID) | ORAL | Status: DC
  Administered 2020-05-17 – 2020-05-19 (×4): 8.6 mg via ORAL
  Filled 2020-05-17 (×4): qty 1

## 2020-05-17 MED ORDER — MELATONIN 5 MG PO TABS: 5.0000 mg | ORAL_TABLET | Freq: Every evening | ORAL | Status: DC | PRN

## 2020-05-17 MED ORDER — LACTATED RINGERS IV SOLN: INTRAVENOUS | Status: DC

## 2020-05-17 MED ORDER — ONDANSETRON HCL 4 MG/2ML IJ SOLN: 4.0000 mg | Freq: Four times a day (QID) | INTRAMUSCULAR | Status: DC | PRN

## 2020-05-17 MED ORDER — SODIUM CHLORIDE 0.9 % IV SOLN: INTRAVENOUS | Status: DC

## 2020-05-17 MED ORDER — LOPERAMIDE HCL 2 MG PO CAPS: 2.0000 mg | ORAL_CAPSULE | Freq: Every day | ORAL | Status: DC | PRN

## 2020-05-17 MED ORDER — BUSPIRONE HCL 5 MG PO TABS
15.0000 mg | ORAL_TABLET | Freq: Two times a day (BID) | ORAL | Status: DC
  Administered 2020-05-17 – 2020-05-20 (×6): 15 mg via ORAL
  Filled 2020-05-17 (×6): qty 1

## 2020-05-17 MED ORDER — ACETAMINOPHEN 650 MG RE SUPP: 650.0000 mg | Freq: Four times a day (QID) | RECTAL | Status: DC | PRN

## 2020-05-17 MED ORDER — LACTATED RINGERS IV BOLUS
500.0000 mL | Freq: Once | INTRAVENOUS | Status: AC
  Administered 2020-05-17: 500 mL via INTRAVENOUS

## 2020-05-17 MED ORDER — EZETIMIBE 10 MG PO TABS
10.0000 mg | ORAL_TABLET | Freq: Every day | ORAL | Status: DC
  Administered 2020-05-18 – 2020-05-20 (×3): 10 mg via ORAL
  Filled 2020-05-17 (×3): qty 1

## 2020-05-17 MED ORDER — SODIUM CHLORIDE 0.9 % IV SOLN
1.0000 g | INTRAVENOUS | Status: DC
  Administered 2020-05-18 – 2020-05-19 (×2): 1 g via INTRAVENOUS
  Filled 2020-05-17: qty 1
  Filled 2020-05-17: qty 10

## 2020-05-17 MED ORDER — VENLAFAXINE HCL ER 150 MG PO CP24
150.0000 mg | ORAL_CAPSULE | Freq: Every day | ORAL | Status: DC
  Administered 2020-05-18 – 2020-05-20 (×3): 150 mg via ORAL
  Filled 2020-05-17 (×3): qty 1

## 2020-05-17 MED ORDER — ALBUTEROL SULFATE (2.5 MG/3ML) 0.083% IN NEBU
2.5000 mg | INHALATION_SOLUTION | Freq: Four times a day (QID) | RESPIRATORY_TRACT | Status: DC
  Administered 2020-05-17: 2.5 mg via RESPIRATORY_TRACT
  Filled 2020-05-17: qty 3

## 2020-05-17 MED ORDER — ACETAMINOPHEN 325 MG PO TABS
650.0000 mg | ORAL_TABLET | Freq: Four times a day (QID) | ORAL | Status: DC | PRN
  Administered 2020-05-17 – 2020-05-19 (×5): 650 mg via ORAL
  Filled 2020-05-17 (×5): qty 2

## 2020-05-17 MED ORDER — SODIUM CHLORIDE 0.9 % IV SOLN
1.0000 g | Freq: Once | INTRAVENOUS | Status: AC
  Administered 2020-05-17: 1 g via INTRAVENOUS
  Filled 2020-05-17: qty 10

## 2020-05-17 MED ORDER — ENOXAPARIN SODIUM 40 MG/0.4ML ~~LOC~~ SOLN
40.0000 mg | SUBCUTANEOUS | Status: DC
  Administered 2020-05-17 – 2020-05-19 (×3): 40 mg via SUBCUTANEOUS
  Filled 2020-05-17 (×3): qty 0.4

## 2020-05-17 MED ORDER — PANTOPRAZOLE SODIUM 40 MG PO TBEC
40.0000 mg | DELAYED_RELEASE_TABLET | Freq: Every day | ORAL | Status: DC
  Administered 2020-05-17 – 2020-05-20 (×4): 40 mg via ORAL
  Filled 2020-05-17 (×4): qty 1

## 2020-05-17 MED ORDER — MORPHINE SULFATE (PF) 2 MG/ML IV SOLN: 2.0000 mg | INTRAVENOUS | Status: DC | PRN

## 2020-05-17 MED ORDER — BISACODYL 5 MG PO TBEC: 5.0000 mg | DELAYED_RELEASE_TABLET | Freq: Every day | ORAL | Status: DC | PRN

## 2020-05-17 MED ORDER — POLYETHYLENE GLYCOL 3350 17 G PO PACK: 17.0000 g | PACK | Freq: Every day | ORAL | Status: DC | PRN

## 2020-05-17 MED ORDER — ALBUTEROL SULFATE (2.5 MG/3ML) 0.083% IN NEBU: 2.5000 mg | INHALATION_SOLUTION | Freq: Four times a day (QID) | RESPIRATORY_TRACT | Status: DC | PRN

## 2020-05-17 MED ORDER — ONDANSETRON HCL 4 MG PO TABS: 4.0000 mg | ORAL_TABLET | Freq: Four times a day (QID) | ORAL | Status: DC | PRN

## 2020-05-17 MED ORDER — HYDRALAZINE HCL 20 MG/ML IJ SOLN
10.0000 mg | Freq: Four times a day (QID) | INTRAMUSCULAR | Status: DC | PRN
Start: 2020-05-17 — End: 2020-05-20

## 2020-05-17 NOTE — Telephone Encounter (Signed)
Shanon Brow [phone] with [name] calling for orders to continue PT with patient twice a week for 6 weeks and once a week for 2 weeks. Shanon Brow also wanting orders for home health aid to come twice a week for 5 weeks. Okay for verbal orders? Written orders to follow.

## 2020-05-17 NOTE — ED Provider Notes (Addendum)
[facility] EMERGENCY DEPT Provider Note   CSN: [id number] Arrival date & time: 05/17/20  1323     History Chief Complaint  Patient presents with  . Fever    [name] is a 80 y.o. female.  HPI Patient was discharged from the hospital 3 days ago after being admitted for sepsis and a renal stent placed.  She reports that she developed several episodes of fever with chills.  She has fever as high as 102 with shaking chills.  Patient reports she does have some general weakness but has not had any syncopal episodes.  No increased shortness of breath or chest pain.  She reports she is had some abdominal discomfort which she attributed to being normal after having had her ureteral stent placed during her hospitalization.  She denies she is experiencing pain or burning with urination.  She has been able to eat small amounts but still has had problems with nausea and weakness.  She continues to have poor appetite.  She denies any pain into her lower legs or significant swelling    Past Medical History:  Diagnosis Date  . Allergy   . CLL (chronic lymphocytic leukemia) ([place])   . GERD (gastroesophageal reflux disease)   . Gout   . HLD (hyperlipidemia)   . Hypertension     Patient Active Problem List   Diagnosis Date Noted  . AKI (acute kidney injury) ([place]) 05/06/2020  . UTI (urinary tract infection) 05/06/2020  . Hypotension 05/06/2020  . 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 Acute cystitis with hematuria 12/15/2019  . Allergic rhinitis due to pollen 12/08/2019  . Elevated cholesterol 12/08/2019  . History of MI (myocardial infarction) 12/08/2019  . Urinary frequency 12/08/2019  . CLL (chronic lymphocytic leukemia) ([place]) 12/08/2019  . History of gout 12/08/2019    Past Surgical History:  Procedure Laterality Date  . ABDOMINAL HYSTERECTOMY    . APPENDECTOMY    . CHOLECYSTECTOMY    . CYSTOSCOPY WITH STENT PLACEMENT Left 05/12/2020   Procedure: CYSTOSCOPY WITH STENT  PLACEMENT;  Surgeon: Lucas Mallow, MD;  Location: [facility];  Service: Urology;  Laterality: Left;  . KIDNEY SURGERY    . MOHS SURGERY       OB History   No obstetric history on file.     Family History  Problem Relation Age of Onset  . Colon cancer Mother   . Heart attack Father     Social History   Tobacco Use  . Smoking status: Never Smoker  . Smokeless tobacco: Never Used  Vaping Use  . Vaping Use: Never used  Substance Use Topics  . Alcohol use: Not Currently    Comment: Rare social drinking  . Drug use: Never    Home Medications Prior to Admission medications   Medication Sig Start Date End Date Taking? Authorizing Provider  acetaminophen (TYLENOL) 325 MG tablet Take 2 tablets (650 mg total) by mouth every 6 (six) hours as needed for mild pain (or Fever >/= 101). 05/14/20  Yes Elgergawy, Silver Huguenin, MD  busPIRone (BUSPAR) 15 MG tablet Take 1 tablet (15 mg total) by mouth 2 (two) times daily. 01/08/20  Yes Dutch Quint B, FNP  cefdinir (OMNICEF) 300 MG capsule Take 300 mg by mouth 2 (two) times daily.   Yes [provider]  cephALEXin (KEFLEX) 250 MG capsule Take by mouth 4 (four) times daily.   Yes [provider]  Cranberry 500 MG TABS Take 1 tablet by mouth daily.   Yes  [provider]  ezetimibe (ZETIA) 10 MG tablet Take 10 mg by mouth daily.   Yes [provider]  fluticasone (FLONASE) 50 MCG/ACT nasal spray Place 2 sprays into both nostrils daily. Patient taking differently: Place 2 sprays into both nostrils daily as needed for allergies. 12/08/19  Yes Libby Maw, MD  glucosamine-chondroitin 500-400 MG tablet Take 1 tablet by mouth daily.   Yes [provider]  guaifenesin (HUMIBID E) 400 MG TABS tablet Take 400 mg by mouth every 4 (four) hours as needed (cough).   Yes [provider]  lansoprazole (PREVACID) 30 MG capsule Take 30 mg by mouth daily at 12 noon.   Yes [provider]   loperamide (IMODIUM) 2 MG capsule Take by mouth daily as needed for diarrhea or loose stools.   Yes [provider]  loratadine (CLARITIN) 10 MG tablet Take 10 mg by mouth daily as needed for allergies.   Yes [provider]  metoprolol-hydrochlorothiazide (LOPRESSOR HCT) 100-25 MG tablet Take 0.5 tablets by mouth 2 (two) times daily.   Yes [provider]  Multiple Vitamins-Minerals (EQ MULTIVITAMINS ADULT GUMMY) CHEW Chew 1 tablet by mouth in the morning.   Yes [provider]  potassium chloride (KLOR-CON) 10 MEQ tablet Take 10 mEq by mouth daily.   Yes [provider]  trimethoprim (TRIMPEX) 100 MG tablet Take 100 mg by mouth at bedtime.   Yes [provider]  venlafaxine XR (EFFEXOR-XR) 150 MG 24 hr capsule Take 150 mg by mouth daily with breakfast.   Yes [provider]  Melatonin 5 MG CHEW Chew 1 tablet by mouth at bedtime as needed (sleep).    [provider]  metoprolol tartrate (LOPRESSOR) 25 MG tablet Take 25 mg by mouth 2 (two) times daily.  01/05/20  [provider]    Allergies    Amoxicillin, Augmentin [amoxicillin-pot clavulanate], Drug [tape], Sulfa antibiotics, Atenolol, and Macrodantin [nitrofurantoin]  Review of Systems   Review of Systems 10 systems reviewed and negative except as per HPI Physical Exam Updated Vital Signs BP 104/67   Pulse 85   Temp 98.3 F (36.8 C) (Oral)   Resp 18   SpO2 98%   Physical Exam Constitutional:      Comments: Patient is alert.  She is slightly pale in appearance.  Nontoxic.  No respiratory distress.  HENT:     Head: Normocephalic and atraumatic.     Mouth/Throat:     Pharynx: Oropharynx is clear.  Eyes:     Extraocular Movements: Extraocular movements intact.  Cardiovascular:     Rate and Rhythm: Normal rate and regular rhythm.  Pulmonary:     Effort: Pulmonary effort is normal.     Breath sounds: Normal breath sounds.  Abdominal:      Comments: Abdomen is soft.  No guarding.  No appreciable mass  Musculoskeletal:        General: No swelling or tenderness. Normal range of motion.     Right lower leg: No edema.     Left lower leg: No edema.  Skin:    General: Skin is warm and dry.     Coloration: Skin is pale.  Neurological:     General: No focal deficit present.     Mental Status: She is oriented to person, place, and time.     Cranial Nerves: No cranial nerve deficit.     Coordination: Coordination normal.  Psychiatric:        Mood and Affect:  Mood normal.     ED Results / Procedures / Treatments   Labs (all labs ordered are listed, but only abnormal results are displayed) Labs Reviewed  COMPREHENSIVE METABOLIC PANEL - Abnormal; Notable for the following components:      Result Value   Glucose, Bld 113 (*)    Calcium 8.6 (*)    Total Protein 5.7 (*)    GFR, Estimated 59 (*)    All other components within normal limits  LACTIC ACID, PLASMA - Abnormal; Notable for the following components:   Lactic Acid, Venous 2.7 (*)    All other components within normal limits  CBC WITH DIFFERENTIAL/PLATELET - Abnormal; Notable for the following components:   WBC 50.6 (*)    RBC 2.82 (*)    Hemoglobin 9.1 (*)    HCT 29.3 (*)    MCV 103.9 (*)    RDW 16.1 (*)    Neutro Abs 9.1 (*)    Lymphs Abs 39.5 (*)    Monocytes Absolute 1.5 (*)    All other components within normal limits  URINALYSIS, ROUTINE W REFLEX MICROSCOPIC - Abnormal; Notable for the following components:   APPearance HAZY (*)    Hgb urine dipstick MODERATE (*)    Protein, ur 30 (*)    Leukocytes,Ua LARGE (*)    Bacteria, UA RARE (*)    All other components within normal limits  CULTURE, BLOOD (ROUTINE X 2)  CULTURE, BLOOD (ROUTINE X 2)  CULTURE, BLOOD (SINGLE)  URINE CULTURE  RESP PANEL BY RT-PCR (FLU A&B, COVID) ARPGX2  PROTIME-INR  LACTIC ACID, PLASMA  APTT  PATHOLOGIST SMEAR REVIEW    EKG None  Radiology DG Chest Port 1 View  Result  Date: 05/17/2020 CLINICAL DATA:  Fever EXAM: PORTABLE CHEST 1 VIEW COMPARISON:  May 06, 2020 FINDINGS: There is no edema or airspace opacity. Heart size and pulmonary vascularity are normal. No adenopathy. There are several healed rib fractures on the left. There is postoperative change in the right humerus. IMPRESSION: No edema or airspace opacity.  Stable cardiac silhouette. Electronically Signed   By: Lowella Grip III M.D.   On: 05/17/2020 15:08    Procedures Procedures  CRITICAL CARE Performed by: Charlesetta Shanks   Total critical care time: 30 minutes  Critical care time was exclusive of separately billable procedures and treating other patients.  Critical care was necessary to treat or prevent imminent or life-threatening deterioration.  Critical care was time spent personally by me on the following activities: development of treatment plan with patient and/or surrogate as well as nursing, discussions with consultants, evaluation of patient's response to treatment, examination of patient, obtaining history from patient or surrogate, ordering and performing treatments and interventions, ordering and review of laboratory studies, ordering and review of radiographic studies, pulse oximetry and re-evaluation of patient's condition. Medications Ordered in ED Medications  lactated ringers bolus 500 mL (has no administration in time range)  lactated ringers infusion (has no administration in time range)  cefTRIAXone (ROCEPHIN) 1 g in sodium chloride 0.9 % 100 mL IVPB (1 g Intravenous New Bag/Given 05/17/20 1501)    ED Course  I have reviewed the triage vital signs and the nursing notes.  Pertinent labs & imaging results that were available during my care of the patient were reviewed by me and considered in my medical decision making (see chart for details).    MDM Rules/Calculators/A&P  Consult: [facility] Dr. Avon Gully for admission.   Patient  presents with several episodes of fever and chills since hospitalization 3 days ago.  Patient does have a renal stent in place.  I do have concern for possible persistent UTI with stent.  We will proceed with diagnostic evaluation for sepsis work-up and empiric Rocephin.  Patient does have mildly elevated lactic acid.  She has risk factors for bacteremia and early sepsis.  Patient has CLL with chronically elevated white count, patient diagnostically does not help determining patient's risk for bacteremia and sepsis.  Blood pressures are slightly soft.  Will start gentle hydration and empiric antibiotics. Final Clinical Impression(s) / ED Diagnoses Final diagnoses:  Fever, unspecified fever cause  S/P ureteral stent placement  SIRS (systemic inflammatory response syndrome) ([place])    Rx / DC Orders ED Discharge Orders    None       Charlesetta Shanks, MD 05/17/20 1518    Charlesetta Shanks, MD 05/17/20 1521    Charlesetta Shanks, MD 05/17/20 1541

## 2020-05-17 NOTE — H&P (Signed)
[facility] History and Physical  Adel Neyer GEZ:[id number] DOB: Oct 09, 1940 DOA: 05/17/2020 PCP: Libby Maw, MD  Admitted from: Home Chief Complaint: Fever  History of Present Illness: Sarah [name] is a 80 y.o. 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 Patient presented to the ED at [facility] today with complaint of several episodes of fever, chills, associated poor oral intake, fatigue, abdominal discomfort. She was discharged on 3/25.  Next day she had a fever of 102 despite being on the course of antibiotics as suggested.  She has had intermittent fevers after that, no burning urination but persistent pain on the left flank.   In the ED, patient was afebrile, heart rate elevated to 93, blood pressure in low normal range, breathing on room air.   Labs showed unremarkable BMP, WBC count elevated to 50.6 (chronically elevated due to CLL, was 30.8 at discharge 3 days ago).  Lactic acid level elevated to 2.7, hemoglobin low and stable at 9.1. Urinalysis with hazy yellow urine, large amount of leukocytes Chest x-ray normal. Patient was started on IV antibiotics, IV fluid and [facility].  At the time of my evaluation, patient was lying down in bed. Comfortable except for mild discomfort in the left flank area.  No family at bedside.  Review of Systems:  All systems were reviewed and were negative unless otherwise mentioned in the HPI   Past medical history: Past Medical History:  Diagnosis Date  . Allergy   . CLL (chronic lymphocytic leukemia) ([place])   . GERD (gastroesophageal reflux disease)   . Gout   . HLD (hyperlipidemia)   . Hypertension     Past surgical history: Past Surgical History:  Procedure Laterality Date  . ABDOMINAL HYSTERECTOMY     . APPENDECTOMY    . CHOLECYSTECTOMY    . CYSTOSCOPY WITH STENT PLACEMENT Left 05/12/2020   Procedure: CYSTOSCOPY WITH STENT PLACEMENT;  Surgeon: Lucas Mallow, MD;  Location: [place];  Service: Urology;  Laterality: Left;  . KIDNEY SURGERY    . MOHS SURGERY      Social History:  reports that she has never smoked. She has never used smokeless tobacco. She reports previous alcohol use. She reports that she does not use drugs.  Allergies:  Allergies  Allergen Reactions  . Amoxicillin   . Augmentin [Amoxicillin-Pot Clavulanate]     GI upset   . Drug [Tape]   . Sulfa Antibiotics     Childhood   . Atenolol Rash  . Macrodantin [Nitrofurantoin] Rash    Family history:  Family History  Problem Relation Age of Onset  . Colon cancer Mother   . Heart attack Father      Home Meds: Prior to Admission medications   Medication Sig Start Date End Date Taking? Authorizing Provider  acetaminophen (TYLENOL) 325 MG tablet Take 2 tablets (650 mg total) by mouth every 6 (six) hours as needed for mild pain (or Fever >/= 101). 05/14/20  Yes Elgergawy, Silver Huguenin, MD  busPIRone (BUSPAR) 15 MG tablet Take 1 tablet (15 mg total) by mouth 2 (two) times daily. 01/08/20  Yes Dutch Quint B, FNP  cefdinir (OMNICEF) 300 MG capsule Take 300 mg by mouth 2 (two) times daily.   Yes [provider]  cephALEXin (KEFLEX) 250 MG capsule Take by mouth  4 (four) times daily.   Yes [provider]  Cranberry 500 MG TABS Take 1 tablet by mouth daily.   Yes [provider]  ezetimibe (ZETIA) 10 MG tablet Take 10 mg by mouth daily.   Yes [provider]  fluticasone (FLONASE) 50 MCG/ACT nasal spray Place 2 sprays into both nostrils daily. Patient taking differently: Place 2 sprays into both nostrils daily as needed for allergies. 12/08/19  Yes Libby Maw, MD  glucosamine-chondroitin 500-400 MG tablet Take 1 tablet by mouth daily.   Yes [provider]   guaifenesin (HUMIBID E) 400 MG TABS tablet Take 400 mg by mouth every 4 (four) hours as needed (cough).   Yes [provider]  lansoprazole (PREVACID) 30 MG capsule Take 30 mg by mouth daily at 12 noon.   Yes [provider]  loperamide (IMODIUM) 2 MG capsule Take by mouth daily as needed for diarrhea or loose stools.   Yes [provider]  loratadine (CLARITIN) 10 MG tablet Take 10 mg by mouth daily as needed for allergies.   Yes [provider]  metoprolol-hydrochlorothiazide (LOPRESSOR HCT) 100-25 MG tablet Take 0.5 tablets by mouth 2 (two) times daily.   Yes [provider]  Multiple Vitamins-Minerals (EQ MULTIVITAMINS ADULT GUMMY) CHEW Chew 1 tablet by mouth in the morning.   Yes [provider]  potassium chloride (KLOR-CON) 10 MEQ tablet Take 10 mEq by mouth daily.   Yes [provider]  trimethoprim (TRIMPEX) 100 MG tablet Take 100 mg by mouth at bedtime.   Yes [provider]  venlafaxine XR (EFFEXOR-XR) 150 MG 24 hr capsule Take 150 mg by mouth daily with breakfast.   Yes [provider]  Melatonin 5 MG CHEW Chew 1 tablet by mouth at bedtime as needed (sleep).    [provider]  metoprolol tartrate (LOPRESSOR) 25 MG tablet Take 25 mg by mouth 2 (two) times daily.  01/05/20  [provider]    Physical Exam: Vitals:   05/17/20 1515 05/17/20 1530 05/17/20 1545 05/17/20 1737  BP: 109/68 112/72 (!) 117/56 121/67  Pulse: 84 85 85 85  Resp:    16  Temp:      TempSrc:      SpO2: 100% 96% 97% 100%   Wt Readings from Last 3 Encounters:  05/12/20 76.2 kg  04/26/20 73.3 kg  02/10/20 73.8 kg   There is no height or weight on file to calculate BMI.  General exam: Pleasant elderly Caucasian female. Skin: No rashes, lesions or ulcers. HEENT: Atraumatic, normocephalic, no obvious bleeding Lungs: Clear to auscultation bilaterally CVS: Regular rate and rhythm, no murmur GI/Abd soft,  nondistended, left flank tenderness present, bowel sound present.  Mild left CVA tenderness present. CNS: Alert, awake, oriented x3 Psychiatry: Mood appropriate Extremities: No pedal edema, no calf tenderness     Consult Orders  (From admission, onward)         Start     Ordered   05/17/20 1517  Consult to hospitalist  Called [facility] spoke with thomas @ 15:19  Once       Provider:  (Not yet assigned)  Question Answer Comment  Place call to: [facility]   Reason for Consult Admit      05/17/20 1516          Labs on Admission:   CBC: Recent Labs  Lab 05/11/20 0302 05/12/20 0224 05/13/20 0131 05/14/20 0044 05/17/20 1402  WBC 27.9* 27.1* 30.6* 30.8* 50.6*  NEUTROABS 4.5 4.9 3.0 3.1 9.1*  HGB 9.2* 9.2* 8.4* 9.0* 9.1*  HCT 29.0* 28.5* 27.1* 28.6* 29.3*  MCV 101.4* 102.9* 105.0* 104.8* 103.9*  PLT 198 189 177 218 828    Basic Metabolic Panel: Recent Labs  Lab 05/11/20 0302 05/12/20 0224 05/13/20 0131 05/14/20 0044 05/17/20 1402  NA 137 137 139 137 137  K 3.4* 3.9 4.7 4.0 4.7  CL 102 103 105 103 99  CO2 27 26 28 25 26   GLUCOSE 95 101* 155* 102* 113*  BUN 11 13 16 20 17   CREATININE 1.23* 1.19* 1.07* 0.99 0.97  CALCIUM 8.5* 8.5* 8.2* 8.1* 8.6*  MG 1.6*  --   --   --   --     Liver Function Tests: Recent Labs  Lab 05/11/20 0302 05/12/20 0224 05/13/20 0131 05/14/20 0044 05/17/20 1402  AST 16 19 19 20 30   ALT 15 14 14 14 14   ALKPHOS 99 117 106 109 118  BILITOT 0.2* 0.5 0.2* 0.5 0.6  PROT 4.9* 5.0* 4.8* 4.8* 5.7*  ALBUMIN 2.7* 2.7* 2.6* 2.8* 3.7   No results for input(s): LIPASE, AMYLASE in the last 168 hours. No results for input(s): AMMONIA in the last 168 hours.  Cardiac Enzymes: No results for input(s): CKTOTAL, CKMB, CKMBINDEX, TROPONINI in the last 168 hours.  BNP (last 3 results) Recent Labs    05/12/20 0224 05/13/20 0131 05/14/20 0044  BNP 80.0 163.3* 100.4*    ProBNP (last 3 results) No results for input(s): PROBNP in the  last 8760 hours.  CBG: No results for input(s): GLUCAP in the last 168 hours.  Lipase  No results found for: LIPASE   Urinalysis    Component Value Date/Time   COLORURINE YELLOW 05/17/2020 1402   APPEARANCEUR HAZY (A) 05/17/2020 1402   LABSPEC 1.022 05/17/2020 1402   PHURINE 6.0 05/17/2020 1402   GLUCOSEU NEGATIVE 05/17/2020 1402   GLUCOSEU NEGATIVE 12/11/2019 1142   HGBUR MODERATE (A) 05/17/2020 1402   BILIRUBINUR NEGATIVE 05/17/2020 1402   BILIRUBINUR negative 02/10/2020 1426   KETONESUR NEGATIVE 05/17/2020 1402   PROTEINUR 30 (A) 05/17/2020 1402   UROBILINOGEN 0.2 02/10/2020 1426   UROBILINOGEN 0.2 12/11/2019 1142   NITRITE NEGATIVE 05/17/2020 1402   LEUKOCYTESUR LARGE (A) 05/17/2020 1402     Drugs of Abuse  No results found for: LABOPIA, COCAINSCRNUR, LABBENZ, AMPHETMU, THCU, LABBARB    Radiological Exams on Admission: DG Chest Port 1 View  Result Date: 05/17/2020 CLINICAL DATA:  Fever EXAM: PORTABLE CHEST 1 VIEW COMPARISON:  May 06, 2020 FINDINGS: There is no edema or airspace opacity. Heart size and pulmonary vascularity are normal. No adenopathy. There are several healed rib fractures on the left. There is postoperative change in the right humerus. IMPRESSION: No edema or airspace opacity.  Stable cardiac silhouette. Electronically Signed   By: Lowella Grip III M.D.   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  She completed the course of antibiotics as suggested. -Presented again with intermittent fever and left flank pain. -Urinalysis shows large amount of leukocytes.  -No fever in the ED but heart rate of 93, WBC  count elevated than baseline, lactic acid level elevated as well.  Meets criteria for severe sepsis.   -Blood cultures sent from ED.  IV  Rocephin was started.  Continue the same.   -Maintenance fluid with normal saline 75 mill per hour. -We will discuss with urology in the morning if the recent stent needs to be addressed at this time. Recent Labs  Lab 05/11/20 0302 05/12/20 0224 05/13/20 0131 05/14/20 0044 05/17/20 1402 05/17/20 1602  WBC 27.9* 27.1* 30.6* 30.8* 50.6*  --   LATICACIDVEN  --   --   --   --  2.7* 3.3*   Essential hypertension -On metoprolol and HCTZ at home.  Blood pressure in low normal range. -Hold home blood pressure medicines  Chronic macrocytic anemia -Hemoglobin stable. Recent Labs    12/08/19 1400 01/05/20 1205 05/11/20 0302 05/12/20 0224 05/13/20 0131 05/14/20 0044 05/17/20 1402  HGB 12.3   < > 9.2* 9.2* 8.4* 9.0* 9.1*  MCV 105.0*   < > 101.4* 102.9* 105.0* 104.8* 103.9*  VITAMINB12 372  --   --   --   --   --   --   FOLATE >24.8  --   --   --   --   --   --    < > = values in this interval not displayed.   CLL -Baseline elevated WBC count but significantly elevated today to 51,000. -Trend WBC. Recent Labs  Lab 05/11/20 0302 05/12/20 0224 05/13/20 0131 05/14/20 0044 05/17/20 1402  WBC 27.9* 27.1* 30.6* 30.8* 50.6*   Chronic intermittent diarrhea -On as needed Imodium.  Monitor diarrhea because of recent and current antibiotic course.  GERD -PPI  Hyperlipidemia -Zetia 10 mg daily,  Breast cancer in remission -Follow-up with oncology as an outpatient  Anxiety/depression -Buspirone 15 mg twice daily, venlafaxine 150 mg daily  Mobility: Encourage ambulation Code Status:   Code Status: DNR confirmed with patient DVT prophylaxis:  Lovenox subcu Antimicrobials:  IV Rocephin Fluid: NS at 51  Diet:  Diet Order            Diet regular Room service appropriate? Yes; Fluid consistency: Thin  Diet effective now                  Consultants: We will call urology in the morning Family Communication:  None at bedside  Dispo: The patient is from: Home              Anticipated d/c is to: Home most likely in 2 to 3 days  ------------------------------------------------------------------------------------- Severity of Illness: The appropriate patient status for this patient is INPATIENT. Inpatient status is judged to be reasonable and necessary in order to provide the required intensity of service to ensure the patient's safety. The patient's presenting symptoms, physical exam findings, and initial radiographic and laboratory data in the context of their chronic comorbidities is felt to place them at high risk for further clinical deterioration. Furthermore, it is not anticipated that the patient will be medically stable for discharge from the hospital within 2 midnights of admission. The following factors support the patient status of inpatient.   " The patient's presenting symptoms include fever, left flank pain, weakness. " The worrisome physical exam findings include left flank tenderness. " The initial radiographic and laboratory data are worrisome because of recent instrumentation, elevated lactic acid level. " The chronic co-morbidities include recent ureteral stent placement.   * I certify that at the point of admission it is my clinical judgment that the patient will require inpatient hospital care spanning beyond 2 midnights from the point of admission due to high intensity of service, high risk for further  deterioration and high frequency of surveillance required.*  Signed, Terrilee Croak, MD [facility] 05/17/2020

## 2020-05-17 NOTE — ED Triage Notes (Signed)
Patient reports tot he ER after being admitted x7 days and receiving a renal stent in the hospital. Patient reports frequent fevers that are spiking. Patient reportedly has been more fatigued.

## 2020-05-17 NOTE — Telephone Encounter (Signed)
Sarah [name] with [facility] called back requesting appt w/in 4 hours. Advised her that our office does not have appt within 4 hours due to fever 102.2 since yesterday. Sarah [name] had pts dtr [name] on the line and transferred call back to the office.  I spoke with Dr. Ethelene Hal and he advised that pt go to the ER due to fever and potential sepsis. Call got disconnected but I was able to reconnect with [name] and she will get patient ready and take her to the ER.

## 2020-05-17 NOTE — ED Notes (Signed)
Notified charge nurse at [facility] of critical lactic acid 3.2

## 2020-05-17 NOTE — Telephone Encounter (Signed)
Pt's daughter(Maureen) is calling in for her mom. Pt had a temperature of 102.2 yesterday 05/16/20, and today it is 99.0. I called the Triage Nurse and they are talking with her now.

## 2020-05-18 LAB — BASIC METABOLIC PANEL
Anion gap: 7 (ref 5–15)
BUN: 19 mg/dL (ref 8–23)
CO2: 26 mmol/L (ref 22–32)
Calcium: 8.4 mg/dL — ABNORMAL LOW (ref 8.9–10.3)
Chloride: 105 mmol/L (ref 98–111)
Creatinine, Ser: 1.01 mg/dL — ABNORMAL HIGH (ref 0.44–1.00)
GFR, Estimated: 56 mL/min — ABNORMAL LOW (ref >60)
Glucose, Bld: 123 mg/dL — ABNORMAL HIGH (ref 70–99)
Potassium: 4.8 mmol/L (ref 3.5–5.1)
Sodium: 138 mmol/L (ref 135–145)

## 2020-05-18 LAB — CBC
HCT: 27.2 % — ABNORMAL LOW (ref 36.0–46.0)
Hemoglobin: 8.3 g/dL — ABNORMAL LOW (ref 12.0–15.0)
MCH: 32.3 pg (ref 26.0–34.0)
MCHC: 30.5 g/dL (ref 30.0–36.0)
MCV: 105.8 fL — ABNORMAL HIGH (ref 80.0–100.0)
Platelets: 208 10*3/uL (ref 150–400)
RBC: 2.57 MIL/uL — ABNORMAL LOW (ref 3.87–5.11)
RDW: 15.9 % — ABNORMAL HIGH (ref 11.5–15.5)
WBC: 36.7 10*3/uL — ABNORMAL HIGH (ref 4.0–10.5)
nRBC: 0.1 % (ref 0.0–0.2)

## 2020-05-18 LAB — LACTIC ACID, PLASMA: Lactic Acid, Venous: 1 mmol/L (ref 0.5–1.9)

## 2020-05-18 NOTE — Telephone Encounter (Signed)
Okay, thanks

## 2020-05-18 NOTE — Telephone Encounter (Signed)
Patient admitted to the hospital yesterday per Shanon Brow they will call back for orders when patient is discharged.

## 2020-05-18 NOTE — Progress Notes (Signed)
PROGRESS NOTE  Sarah [name]  DOB: [birth year]  PCP: Libby Maw, MD FTD:[id number]  DOA: 05/17/2020  LOS: 1 day   Chief Complaint  Patient presents with  . Fever   Brief narrative: Sarah [name] is a 80 y.o. 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 Patient presented to the ED at [facility] today with complaint of several episodes of fever, chills, associated poor oral intake, fatigue, abdominal discomfort. She was discharged on 3/25.  Next day she had a fever of 102 despite being on the course of antibiotics as suggested.  She has had intermittent fevers after that, no burning urination but persistent pain on the left flank.   In the ED, patient was afebrile, heart rate elevated to 93, blood pressure in low normal range, breathing on room air.   Labs showed unremarkable BMP, WBC count elevated to 50.6 (chronically elevated due to CLL, was 30.8 at discharge 3 days ago).  Lactic acid level elevated to 2.7, hemoglobin low and stable at 9.1. Urinalysis with hazy yellow urine, large amount of leukocytes Chest x-ray normal. Patient was started on IV antibiotics, IV fluid and [facility].  Subjective: Patient was seen and examined this morning.  Chart reviewed. Feels better than at presentation.  As no more diarrhea.  Continues to have mild left flank pain. Had a fever 100.7 this morning. Labs from this morning with improving lactic acid and WBC count.  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  She completed the course of antibiotics as suggested. -Presented again with intermittent fever and left  flank pain. -Urinalysis shows large amount of leukocytes.  -Currently being managed for severe sepsis secondary to UTI.  -Blood cultures sent from ED. currently on IV Rocephin.  Continue the same.  WBC count improving, lactic acid level improving. -Continue IV maintenance fluid with normal saline 75 mill per hour. -Renal ultrasound 3/28 - for any presence of hydronephrosis.  Urology evaluation as an outpatient. Recent Labs  Lab 05/12/20 0224 05/13/20 0131 05/14/20 0044 05/17/20 1402 05/17/20 1602 05/18/20 0332  WBC 27.1* 30.6* 30.8* 50.6*  --  36.7*  LATICACIDVEN  --   --   --  2.7* 3.3* 1.0   Essential hypertension -On metoprolol and HCTZ at home.  Blood pressure in low normal range. -Home meds on hold.  Chronic macrocytic anemia -Hemoglobin is low but stable at baseline. Recent Labs    12/08/19 1400 01/05/20 1205 05/12/20 0224 05/13/20 0131 05/14/20 0044 05/17/20 1402 05/18/20 0332  HGB 12.3   < > 9.2* 8.4* 9.0* 9.1* 8.3*  MCV 105.0*   < > 102.9* 105.0* 104.8* 103.9* 105.8*  VITAMINB12 372  --   --   --   --   --   --   FOLATE >24.8  --   --   --   --   --   --    < > = values in this interval not displayed.   CLL -Baseline elevated WBC count but significantly elevated today to 51,000. -Trend WBC. Recent Labs  Lab 05/12/20 0224 05/13/20 0131 05/14/20 0044 05/17/20 1402 05/18/20 0332  WBC 27.1* 30.6* 30.8* 50.6* 36.7*   GERD -PPI  Hyperlipidemia -Zetia 10 mg daily,  Breast cancer in remission -Follow-up with oncology as an outpatient  Anxiety/depression -Buspirone 15 mg twice daily, venlafaxine 150 mg daily  Mobility: PT/OT eval Code Status:   Code Status: DNR  Nutritional status: Body mass index is 29.76 kg/m.     Diet Order            Diet regular Room service appropriate? Yes; Fluid consistency: Thin  Diet effective now                 DVT prophylaxis: enoxaparin (LOVENOX) injection 40 mg Start: 05/17/20 2200   Antimicrobials:   IV Rocephin Fluid: Normal saline at 75 mill per hour Consultants: None Family Communication:  None at bedside  Status is: Inpatient  Remains inpatient appropriate because: Continues to have fever, needs further IV antibiotics, pending culture report   Dispo: The patient is from: Home              Anticipated d/c is to: Home, pending PT eval              Patient currently is not medically stable to d/c.   Difficult to place patient No       Infusions:  . sodium chloride 75 mL/hr at 05/18/20 0716  . cefTRIAXone (ROCEPHIN)  IV      Scheduled Meds: . busPIRone  15 mg Oral BID  . enoxaparin (LOVENOX) injection  40 mg Subcutaneous Q24H  . ezetimibe  10 mg Oral Daily  . pantoprazole  40 mg Oral Daily  . senna  1 tablet Oral BID  . venlafaxine XR  150 mg Oral Q breakfast    Antimicrobials: Anti-infectives (From admission, onward)   Start     Dose/Rate Route Frequency Ordered Stop   05/18/20 1400  cefTRIAXone (ROCEPHIN) 1 g in sodium chloride 0.9 % 100 mL IVPB        1 g 200 mL/hr over 30 Minutes Intravenous Every 24 hours 05/17/20 1802     05/17/20 1500  cefTRIAXone (ROCEPHIN) 1 g in sodium chloride 0.9 % 100 mL IVPB        1 g 200 mL/hr over 30 Minutes Intravenous  Once 05/17/20 1445 05/17/20 1542      PRN meds: acetaminophen **OR** acetaminophen, albuterol, bisacodyl, hydrALAZINE, loperamide, melatonin, morphine injection, ondansetron **OR** ondansetron (ZOFRAN) IV, oxyCODONE, polyethylene glycol   Objective: Vitals:   05/18/20 0752 05/18/20 0900  BP:    Pulse:    Resp:    Temp: (!) 100.8 F (38.2 C) 99.1 F (37.3 C)  SpO2:      Intake/Output Summary (Last 24 hours) at 05/18/2020 1253 Last data filed at 05/18/2020 0900 Gross per 24 hour  Intake 1185.6 ml  Output 650 ml  Net 535.6 ml   Filed Weights   05/18/20 0744  Weight: 76.2 kg   Weight change:  Body mass index is 29.76 kg/m.   Physical Exam: General exam: Pleasant, elderly Caucasian female.   Not in distress Skin: No rashes, lesions or ulcers. HEENT: Atraumatic, normocephalic, no obvious bleeding Lungs: Clear to auscultation bilaterally CVS: Regular rate and rhythm, no murmur GI/Abd soft, nontender, nondistended, bowel sound present CNS: Alert, awake, oriented x3 Psychiatry: Mood appropriate Extremities: No pedal edema, no calf tenderness  Data Review: I have personally reviewed the laboratory data and studies available.  Recent Labs  Lab 05/12/20 0224 05/13/20 0131 05/14/20 0044 05/17/20 1402 05/18/20 0332  WBC 27.1* 30.6* 30.8* 50.6* 36.7*  NEUTROABS 4.9 3.0 3.1 9.1*  --  HGB 9.2* 8.4* 9.0* 9.1* 8.3*  HCT 28.5* 27.1* 28.6* 29.3* 27.2*  MCV 102.9* 105.0* 104.8* 103.9* 105.8*  PLT 189 177 218 225 208   Recent Labs  Lab 05/12/20 0224 05/13/20 0131 05/14/20 0044 05/17/20 1402 05/18/20 0332  NA 137 139 137 137 138  K 3.9 4.7 4.0 4.7 4.8  CL 103 105 103 99 105  CO2 26 28 25 26 26   GLUCOSE 101* 155* 102* 113* 123*  BUN 13 16 20 17 19   CREATININE 1.19* 1.07* 0.99 0.97 1.01*  CALCIUM 8.5* 8.2* 8.1* 8.6* 8.4*    F/u labs ordered Unresulted Labs (From admission, onward)          Start     Ordered   05/18/20 8676  Basic metabolic panel  Daily,   R      05/17/20 1758   05/18/20 0500  CBC  Daily,   R      05/17/20 1758   05/17/20 1439  Urine culture  (Undifferentiated presentation (screening labs and basic nursing orders))  ONCE - STAT,   STAT        05/17/20 1439   05/17/20 1402  Pathologist smear review  Once,   STAT        05/17/20 1402          Signed, Terrilee Croak, MD [facility] 05/18/2020

## 2020-05-18 NOTE — Evaluation (Signed)
Physical Therapy Evaluation Patient Details Name: Sarah [name] MRN: [id number] DOB: [birth year] Today's Date: 05/18/2020   History of Present Illness  80 yo female admitted with UTI, weakness. Hx of CLL, breast ca, gout, L ureteral stent  Clinical Impression  On eval POD 0, pt required Min assist for mobility. She walked ~25 feet with a RW. Pt presents with general weakness, decreased activity tolerance, and impaired gait and balance. Will plan to follow and progress activity as tolerated. Discussed d/c plan-pt plans to return home and resume HHPT.     Follow Up Recommendations Home health PT    Equipment Recommendations  None recommended by PT    Recommendations for Other Services       Precautions / Restrictions Precautions Precautions: Fall Restrictions Weight Bearing Restrictions: No      Mobility  Bed Mobility Overal bed mobility: Needs Assistance Bed Mobility: Supine to Sit     Supine to sit: Supervision;HOB elevated     General bed mobility comments: supervision for safety, +rail, increased time    Transfers Overall transfer level: Needs assistance Equipment used: Rolling walker (2 wheeled) Transfers: Sit to/from Stand Sit to Stand: Min assist         General transfer comment: Assist to rise, steady, control descent. Cues for safety, hand placement.  Ambulation/Gait Ambulation/Gait assistance: Min assist Gait Distance (Feet): 25 Feet Assistive device: Rolling walker (2 wheeled) Gait Pattern/deviations: Step-through pattern;Decreased stride length     General Gait Details: slow gait speed. unsteady at times.  Stairs            Wheelchair Mobility    Modified Rankin (Stroke Patients Only)       Balance Overall balance assessment: Needs assistance         Standing balance support: Bilateral upper extremity supported Standing balance-Leahy Scale: Poor                               Pertinent Vitals/Pain Pain  Assessment: Faces Faces Pain Scale: Hurts a little bit Pain Location: BLE during ambulation Pain Descriptors / Indicators: Sore;Discomfort Pain Intervention(s): Limited activity within patient's tolerance;Monitored during session;Repositioned    Home Living Family/patient expects to be discharged to:: Private residence Living Arrangements: Children Available Help at Discharge: Available 24 hours/day Type of Home: House Home Access: Stairs to enter Entrance Stairs-Rails: None Entrance Stairs-Number of Steps: 2 Home Layout: Able to live on main level with bedroom/bathroom;Two level Home Equipment: Walker - 4 wheels;Shower seat;Bedside commode;Cane - single point      Prior Function Level of Independence: Independent with assistive device(s)         Comments: Uses rollator. Daughter has fibromyalgia, daughter can help minimally as long as she doesn't exert herself. Son-in-law able to help getting into and out of house.     Hand Dominance        Extremity/Trunk Assessment   Upper Extremity Assessment Upper Extremity Assessment: Generalized weakness;Defer to OT evaluation    Lower Extremity Assessment Lower Extremity Assessment: Generalized weakness    Cervical / Trunk Assessment Cervical / Trunk Assessment: Kyphotic  Communication   Communication: No difficulties  Cognition Arousal/Alertness: Awake/alert Behavior During Therapy: WFL for tasks assessed/performed Overall Cognitive Status: Within Functional Limits for tasks assessed  General Comments      Exercises     Assessment/Plan    PT Assessment Patient needs continued PT services  PT Problem List Decreased strength;Decreased mobility;Decreased balance;Decreased activity tolerance;Decreased knowledge of use of DME;Pain       PT Treatment Interventions DME instruction;Gait training;Therapeutic exercise;Balance training;Therapeutic activities;Functional  mobility training;Patient/family education    PT Goals (Current goals can be found in the Care Plan section)  Acute Rehab PT Goals Patient Stated Goal: home PT Goal Formulation: With patient Time For Goal Achievement: 06/01/20 Potential to Achieve Goals: Good    Frequency Min 3X/week   Barriers to discharge        Co-evaluation               AM-PAC PT "6 Clicks" Mobility  Outcome Measure Help needed turning from your back to your side while in a flat bed without using bedrails?: A Little Help needed moving from lying on your back to sitting on the side of a flat bed without using bedrails?: A Little Help needed moving to and from a bed to a chair (including a wheelchair)?: A Little Help needed standing up from a chair using your arms (e.g., wheelchair or bedside chair)?: A Little Help needed to walk in hospital room?: A Little Help needed climbing 3-5 steps with a railing? : A Little 6 Click Score: 18    End of Session Equipment Utilized During Treatment: Gait belt Activity Tolerance: Patient limited by fatigue Patient left: in bed;with call bell/phone within reach;with bed alarm set   PT Visit Diagnosis: Unsteadiness on feet (R26.81);Muscle weakness (generalized) (M62.81)    Time: 6962-9528 PT Time Calculation (min) (ACUTE ONLY): 27 min   Charges:   PT Evaluation $PT Eval Moderate Complexity: 1 Mod PT Treatments $Gait Training: 8-22 mins          Doreatha Massed, PT Acute Rehabilitation  Office: [phone] Pager: [phone]

## 2020-05-19 DIAGNOSIS — A419 Sepsis, unspecified organism: Secondary | ICD-10-CM

## 2020-05-19 DIAGNOSIS — N390 Urinary tract infection, site not specified: Secondary | ICD-10-CM

## 2020-05-19 DIAGNOSIS — C9110 Chronic lymphocytic leukemia of B-cell type not having achieved remission: Secondary | ICD-10-CM

## 2020-05-19 DIAGNOSIS — R6520 Severe sepsis without septic shock: Secondary | ICD-10-CM

## 2020-05-19 DIAGNOSIS — D72820 Lymphocytosis (symptomatic): Secondary | ICD-10-CM

## 2020-05-19 LAB — CBC
HCT: 27.9 % — ABNORMAL LOW (ref 36.0–46.0)
Hemoglobin: 8.4 g/dL — ABNORMAL LOW (ref 12.0–15.0)
MCH: 31.9 pg (ref 26.0–34.0)
MCHC: 30.1 g/dL (ref 30.0–36.0)
MCV: 106.1 fL — ABNORMAL HIGH (ref 80.0–100.0)
Platelets: 211 10*3/uL (ref 150–400)
RBC: 2.63 MIL/uL — ABNORMAL LOW (ref 3.87–5.11)
RDW: 15.9 % — ABNORMAL HIGH (ref 11.5–15.5)
WBC: 31.4 10*3/uL — ABNORMAL HIGH (ref 4.0–10.5)
nRBC: 0.1 % (ref 0.0–0.2)

## 2020-05-19 LAB — PATHOLOGIST SMEAR REVIEW

## 2020-05-19 LAB — BASIC METABOLIC PANEL
Anion gap: 10 (ref 5–15)
BUN: 15 mg/dL (ref 8–23)
CO2: 22 mmol/L (ref 22–32)
Calcium: 8.3 mg/dL — ABNORMAL LOW (ref 8.9–10.3)
Chloride: 107 mmol/L (ref 98–111)
Creatinine, Ser: 0.77 mg/dL (ref 0.44–1.00)
GFR, Estimated: >60 mL/min (ref >60)
Glucose, Bld: 103 mg/dL — ABNORMAL HIGH (ref 70–99)
Potassium: 3.9 mmol/L (ref 3.5–5.1)
Sodium: 139 mmol/L (ref 135–145)

## 2020-05-19 NOTE — Evaluation (Signed)
Occupational Therapy Evaluation Patient Details Name: Sarah [name] MRN: [id number] DOB: 21-Aug-1940 Today's Date: 05/19/2020    History of Present Illness 80 yo female admitted with UTI, weakness. Hx of CLL, breast ca, gout, L ureteral stent   Clinical Impression   Sarah [name] is an 80 year old woman who lives at home with her daughter who is predominantly independent with ADLs - needing assistance for socks and shoes and supervision for bathing. Reports continues generalized weakness and decreased activity tolerance secondary to ongoing medical issues. On evaluation patient is overall set up assist, needs assistance with LB ADLs, and exhibits poor activity tolerance with mild dyspnea and fatigue. Patient will benefit from skilled OT services while in hospital to improve deficits and learn compensatory strategies as needed in order to return to PLOF.      Follow Up Recommendations  Home health OT;Supervision - Intermittent    Equipment Recommendations  None recommended by OT    Recommendations for Other Services       Precautions / Restrictions Precautions Precautions: Fall Restrictions Weight Bearing Restrictions: No      Mobility Bed Mobility               General bed mobility comments: OOB    Transfers Overall transfer level: Needs assistance Equipment used: Rolling walker (2 wheeled) Transfers: Sit to/from Stand Sit to Stand: Min guard         General transfer comment: Min guard to ambulate from bathroom to recliner with RW. Exhibits mild dyspnea and reports fatigue. Able to walk another 20 feet, to the door and back, again with dyspnea and complaints of moderate fatigue. O2 sat WNL.    Balance Overall balance assessment: Needs assistance Sitting-balance support: No upper extremity supported;Feet supported Sitting balance-Leahy Scale: Good     Standing balance support: During functional activity Standing balance-Leahy Scale: Poor Standing  balance comment: able to take hands off of walker for ADLs                           ADL either performed or assessed with clinical judgement   ADL Overall ADL's : Needs assistance/impaired Eating/Feeding: Independent   Grooming: Standing;Wash/dry hands;Min guard   Upper Body Bathing: Set up;Sitting   Lower Body Bathing: Moderate assistance;Sit to/from stand Lower Body Bathing Details (indicate cue type and reason): unable to reach feet/lower legs Upper Body Dressing : Sitting;Minimal assistance   Lower Body Dressing: Sit to/from stand;Sitting/lateral leans;Moderate assistance Lower Body Dressing Details (indicate cue type and reason): Reports needing assistance with socks and shoes at baseline. Typically wears slip on shoes.  At times can manuever leg on bed for LB dressing. Toilet Transfer: Minimal assistance;RW;Regular Toilet;Grab bars   Toileting- Clothing Manipulation and Hygiene: Moderate assistance Toileting - Clothing Manipulation Details (indicate cue type and reason): Mod assist for pericare, patient able to pull up clothing.     Functional mobility during ADLs: Min guard;Rolling walker       Vision Baseline Vision/History: Wears glasses Wears Glasses: At all times Patient Visual Report: No change from baseline       Perception     Praxis      Pertinent Vitals/Pain Pain Assessment: No/denies pain     Hand Dominance     Extremity/Trunk Assessment Upper Extremity Assessment Upper Extremity Assessment: Generalized weakness (Normal ROM, grossly 4/5 strength throughout)   Lower Extremity Assessment Lower Extremity Assessment: Defer to PT evaluation   Cervical / Trunk Assessment Cervical /  Trunk Assessment: Kyphotic   Communication Communication Communication: No difficulties   Cognition Arousal/Alertness: Awake/alert Behavior During Therapy: WFL for tasks assessed/performed Overall Cognitive Status: Within Functional Limits for tasks  assessed                                     General Comments       Exercises     Shoulder Instructions      Home Living Family/patient expects to be discharged to:: Private residence Living Arrangements: Children Available Help at Discharge: Available 24 hours/day Type of Home: House Home Access: Stairs to enter [employer] of Steps: 2 Entrance Stairs-Rails: None Home Layout: Able to live on main level with bedroom/bathroom;Two level     Bathroom Shower/Tub: Teacher, early years/pre: Handicapped height Bathroom Accessibility: Yes   Home Equipment: Environmental consultant - 4 wheels;Shower seat;Bedside commode;Cane - single point          Prior Functioning/Environment Level of Independence: Independent with assistive device(s)        Comments: Uses rollator. Daughter has fibromyalgia, daughter can help minimally as long as she doesn't exert herself. Son-in-law able to help getting into and out of house.        OT Problem List: Decreased strength;Impaired balance (sitting and/or standing);Decreased activity tolerance;Cardiopulmonary status limiting activity      OT Treatment/Interventions: Self-care/ADL training;DME and/or AE instruction;Patient/family education;Balance training;Therapeutic activities    OT Goals(Current goals can be found in the care plan section) Acute Rehab OT Goals Patient Stated Goal: improve strength and endurance OT Goal Formulation: With patient Time For Goal Achievement: 06/02/20 Potential to Achieve Goals: Good  OT Frequency: Min 2X/week   Barriers to D/C:            Co-evaluation              AM-PAC OT "6 Clicks" Daily Activity     Outcome Measure Help from another person eating meals?: None Help from another person taking care of personal grooming?: A Little Help from another person toileting, which includes using toliet, bedpan, or urinal?: A Little Help from another person bathing (including  washing, rinsing, drying)?: A Lot Help from another person to put on and taking off regular upper body clothing?: A Little Help from another person to put on and taking off regular lower body clothing?: A Lot 6 Click Score: 17   End of Session Equipment Utilized During Treatment: Rolling walker Nurse Communication: Mobility status  Activity Tolerance: Patient tolerated treatment well Patient left: in chair;with call bell/phone within reach  OT Visit Diagnosis: Unsteadiness on feet (R26.81);Other abnormalities of gait and mobility (R26.89);Muscle weakness (generalized) (M62.81)                Time: 6295-2841 OT Time Calculation (min): 24 min Charges:  OT General Charges $OT Visit: 1 Visit OT Evaluation $OT Eval Moderate Complexity: 1 Mod  Desree Leap, OTR/L [place]  Office [phone] Pager: [place] 05/19/2020, 1:31 PM

## 2020-05-19 NOTE — Progress Notes (Signed)
PROGRESS NOTE  Haadiya Frogge SJG:[id number] DOB: 10-17-1940   PCP: Libby Maw, MD  Patient is from: Home.  Uses walker at baseline.  DOA: 05/17/2020 LOS: 2  Chief complaints: Fever, chills, fatigue and abdominal pain  Brief Narrative / Interim history: 80 year old F with PMH of CLL with chronic leukocytosis, breast cancer in remission, HTN, HLD and recent hospitalization from 3/17-3/25 for sepsis due to UTI and left UPJ stenosis with left hydronephrosis for which she had left ureteral stent returning with fever, chills, fatigue and abdominal pain, and admitted for severe sepsis due to UTI.  Started on IV ceftriaxone.  Blood cultures negative.  Urine culture pending.  Subjective: Seen and examined earlier this morning.  Reports feeling better.  She says she had a good night last night.  She spiked fever to 101.4 earlier this morning.   Denies chest pain, dyspnea, cough, nausea, vomiting, dysuria, abdominal pain or new back pain.  She says she has not a bowel movement in 2 days.   Objective: Vitals:   05/18/20 1800 05/18/20 2011 05/18/20 2145 05/19/20 0350  BP:  126/72  138/82  Pulse:  92  96  Resp:  18  17  Temp: 99.1 F (37.3 C) 100.2 F (37.9 C) 98.4 F (36.9 C) (!) 101.4 F (38.6 C)  TempSrc: Axillary Oral Oral Oral  SpO2:  96%  97%  Weight:      Height:        Intake/Output Summary (Last 24 hours) at 05/19/2020 1322 Last data filed at 05/19/2020 0939 Gross per 24 hour  Intake 1601.79 ml  Output 2250 ml  Net -648.21 ml   Filed Weights   05/18/20 0744  Weight: 76.2 kg    Examination:  GENERAL: No apparent distress.  Nontoxic. HEENT: MMM.  Vision and hearing grossly intact.  NECK: Supple.  No apparent JVD.  RESP:  No IWOB.  Fair aeration bilaterally. CVS:  RRR. Heart sounds normal.  ABD/GI/GU: BS+. Abd soft.  With deep palpation. MSK/EXT:  Moves extremities. No apparent deformity.  Trace edema bilaterally. SKIN: no apparent skin lesion or  wound NEURO: Awake, alert and oriented appropriately.  No apparent focal neuro deficit. PSYCH: Calm. Normal affect.   Procedures:  None  Microbiology summarized: [id number] and influenza PCR nonreactive. Blood culture NGTD. Urine culture pending  Assessment & Plan: Severe sepsis secondary to UTI? Left UPJ with moderate left hydronephrosis s/p left ureteral stenting on 3/23 -Presented with fever, chills, fatigue and abdominal discomfort but no dysuria or UTI symptoms which makes me wonder if her symptoms are from UTI or underlying CLL.  She has chronic leukocytosis with lymphocytosis on CBC.  Renal US on 3/28 without hydronephrosis.  Continues to spike fever despite IV antibiotics.   -Recently hospitalized and treated for sepsis due to UTI but no urine culture data -No history of ESBL per chart review.  -Continue IV ceftriaxone pending urine culture -Added oncology to treatment team. Her symptoms could be related to her malignancy.  Essential hypertension: Normotensive.  On metoprolol and HCTZ at home. -Continue holding home meds  Chronic macrocytic anemia: H&H stable. Recent Labs    05/08/20 0221 05/09/20 0244 05/10/20 0337 05/11/20 0302 05/12/20 0224 05/13/20 0131 05/14/20 0044 05/17/20 1402 05/18/20 0332 05/19/20 0338  HGB 8.8* 8.4* 9.1* 9.2* 9.2* 8.4* 9.0* 9.1* 8.3* 8.4*  -Monitor  History of CLL/chronic leukocytosis/lymphocytosis: this could be contributing to her presentation. -Added oncology to treatment team.   GERD -Continue PPI  Hyperlipidemia -Zetia 10 mg daily,  Breast cancer in remission -Follow-up with oncology as an outpatient  Anxiety/depression -Continue home medications   Body mass index is 29.76 kg/m.         DVT prophylaxis:  enoxaparin (LOVENOX) injection 40 mg Start: 05/17/20 2200  Code Status: DNR/DNI Family Communication: Updated patient's daughter over the phone. Level of care: Med-Surg Status is: Inpatient  Remains  inpatient appropriate because:Ongoing diagnostic testing needed not appropriate for outpatient work up, IV treatments appropriate due to intensity of illness or inability to take PO and Inpatient level of care appropriate due to severity of illness   Dispo: The patient is from: Home              Anticipated d/c is to: Home              Patient currently is not medically stable to d/c.   Difficult to place patient No       Consultants:  Oncology    Sch Meds:  Scheduled Meds: . busPIRone  15 mg Oral BID  . enoxaparin (LOVENOX) injection  40 mg Subcutaneous Q24H  . ezetimibe  10 mg Oral Daily  . pantoprazole  40 mg Oral Daily  . senna  1 tablet Oral BID  . venlafaxine XR  150 mg Oral Q breakfast   Continuous Infusions: . sodium chloride 75 mL/hr at 05/19/20 0819  . cefTRIAXone (ROCEPHIN)  IV 1 g (05/18/20 1433)   PRN Meds:.acetaminophen **OR** acetaminophen, albuterol, bisacodyl, hydrALAZINE, loperamide, melatonin, morphine injection, ondansetron **OR** ondansetron (ZOFRAN) IV, oxyCODONE, polyethylene glycol  Antimicrobials: Anti-infectives (From admission, onward)   Start     Dose/Rate Route Frequency Ordered Stop   05/18/20 1400  cefTRIAXone (ROCEPHIN) 1 g in sodium chloride 0.9 % 100 mL IVPB        1 g 200 mL/hr over 30 Minutes Intravenous Every 24 hours 05/17/20 1802     05/17/20 1500  cefTRIAXone (ROCEPHIN) 1 g in sodium chloride 0.9 % 100 mL IVPB        1 g 200 mL/hr over 30 Minutes Intravenous  Once 05/17/20 1445 05/17/20 1542       I have personally reviewed the following labs and images: CBC: Recent Labs  Lab 05/13/20 0131 05/14/20 0044 05/17/20 1402 05/18/20 0332 05/19/20 0338  WBC 30.6* 30.8* 50.6* 36.7* 31.4*  NEUTROABS 3.0 3.1 9.1*  --   --   HGB 8.4* 9.0* 9.1* 8.3* 8.4*  HCT 27.1* 28.6* 29.3* 27.2* 27.9*  MCV 105.0* 104.8* 103.9* 105.8* 106.1*  PLT 177 218 225 208 211   BMP &GFR Recent Labs  Lab 05/13/20 0131 05/14/20 0044 05/17/20 1402  05/18/20 0332 05/19/20 0338  NA 139 137 137 138 139  K 4.7 4.0 4.7 4.8 3.9  CL 105 103 99 105 107  CO2 28 25 26 26 22   GLUCOSE 155* 102* 113* 123* 103*  BUN 16 20 17 19 15   CREATININE 1.07* 0.99 0.97 1.01* 0.77  CALCIUM 8.2* 8.1* 8.6* 8.4* 8.3*   Estimated Creatinine Clearance: 54.8 mL/min (by C-G formula based on SCr of 0.77 mg/dL). Liver & Pancreas: Recent Labs  Lab 05/13/20 0131 05/14/20 0044 05/17/20 1402  AST 19 20 30   ALT 14 14 14   ALKPHOS 106 109 118  BILITOT 0.2* 0.5 0.6  PROT 4.8* 4.8* 5.7*  ALBUMIN 2.6* 2.8* 3.7   No results for input(s): LIPASE, AMYLASE in the last 168 hours. No results for input(s): AMMONIA in the last 168 hours. Diabetic: No results for input(s): HGBA1C in  the last 72 hours. No results for input(s): GLUCAP in the last 168 hours. Cardiac Enzymes: No results for input(s): CKTOTAL, CKMB, CKMBINDEX, TROPONINI in the last 168 hours. No results for input(s): PROBNP in the last 8760 hours. Coagulation Profile: Recent Labs  Lab 05/17/20 1402  INR 1.0   Thyroid Function Tests: No results for input(s): TSH, T4TOTAL, FREET4, T3FREE, THYROIDAB in the last 72 hours. Lipid Profile: No results for input(s): CHOL, HDL, LDLCALC, TRIG, CHOLHDL, LDLDIRECT in the last 72 hours. Anemia Panel: No results for input(s): VITAMINB12, FOLATE, FERRITIN, TIBC, IRON, RETICCTPCT in the last 72 hours. Urine analysis:    Component Value Date/Time   COLORURINE YELLOW 05/17/2020 1402   APPEARANCEUR HAZY (A) 05/17/2020 1402   LABSPEC 1.022 05/17/2020 1402   PHURINE 6.0 05/17/2020 1402   GLUCOSEU NEGATIVE 05/17/2020 1402   GLUCOSEU NEGATIVE 12/11/2019 1142   HGBUR MODERATE (A) 05/17/2020 1402   BILIRUBINUR NEGATIVE 05/17/2020 1402   BILIRUBINUR negative 02/10/2020 1426   KETONESUR NEGATIVE 05/17/2020 1402   PROTEINUR 30 (A) 05/17/2020 1402   UROBILINOGEN 0.2 02/10/2020 1426   UROBILINOGEN 0.2 12/11/2019 1142   NITRITE NEGATIVE 05/17/2020 1402   LEUKOCYTESUR  LARGE (A) 05/17/2020 1402   Sepsis Labs: Invalid input(s): PROCALCITONIN, [place]  Microbiology: Recent Results (from the past 240 hour(s))  Culture, blood (Routine x 2)     Status: None (Preliminary result)   Collection Time: 05/17/20  1:34 PM   Specimen: BLOOD  Result Value Ref Range Status   Specimen Description BLOOD BLOOD LEFT FOREARM  Final   Special Requests   Final    BOTTLES DRAWN AEROBIC AND ANAEROBIC BOTTLES DRAWN AEROBIC AND ANAEROBIC   Culture   Final    NO GROWTH 2 DAYS Performed at [facility], [address] [place], [place] [address]    Report Status PENDING  Incomplete  Culture, blood (Routine x 2)     Status: None (Preliminary result)   Collection Time: 05/17/20  1:39 PM   Specimen: BLOOD  Result Value Ref Range Status   Specimen Description BLOOD RIGHT ANTECUBITAL  Final   Special Requests   Final    BOTTLES DRAWN AEROBIC AND ANAEROBIC Blood Culture adequate volume   Culture   Final    NO GROWTH 2 DAYS Performed at [facility], [place] [address], [place], [place] [address]    Report Status PENDING  Incomplete  Urine culture     Status: None (Preliminary result)   Collection Time: 05/17/20  2:02 PM   Specimen: In/Out Cath Urine  Result Value Ref Range Status   Specimen Description   Final    IN/OUT CATH URINE Performed at [facility]    Special Requests NONE Performed at [facility]   Final   Culture   Final    CULTURE REINCUBATED FOR BETTER GROWTH Performed at [facility], [place] [address] [place],  [address]    Report Status PENDING  Incomplete  Resp Panel by RT-PCR (Flu A&B, Covid) Nasopharyngeal Swab     Status: None   Collection Time: 05/17/20  3:02 PM   Specimen: Nasopharyngeal Swab; Nasopharyngeal(NP) swabs in vial transport medium  Result Value Ref Range Status   SARS Coronavirus 2 by RT PCR NEGATIVE NEGATIVE Final    Comment: (NOTE) SARS-CoV-2 target nucleic acids are NOT  DETECTED.  The SARS-CoV-2 RNA is generally detectable in upper respiratory specimens during the acute phase of infection. The lowest concentration of SARS-CoV-2  viral copies this assay can detect is 138 copies/mL. A negative result does not preclude SARS-Cov-2 infection and should not be used as the sole basis for treatment or other patient management decisions. A negative result may occur with  improper specimen collection/handling, submission of specimen other than nasopharyngeal swab, presence of viral mutation(s) within the areas targeted by this assay, and inadequate number of viral copies(<138 copies/mL). A negative result must be combined with clinical observations, patient history, and epidemiological information. The expected result is Negative.  Fact Sheet for Patients:  EntrepreneurPulse.com.au  Fact Sheet for Healthcare Providers:  IncredibleEmployment.be  This test is no t yet approved or cleared by the [place] FDA and  has been authorized for detection and/or diagnosis of SARS-CoV-2 by FDA under an Emergency Use Authorization (EUA). This EUA will remain  in effect (meaning this test can be used) for the duration of the COVID-19 declaration under Section 564(b)(1) of the Act, 21 U.S.C.section 360bbb-3(b)(1), unless the authorization is terminated  or revoked sooner.       Influenza A by PCR NEGATIVE NEGATIVE Final   Influenza B by PCR NEGATIVE NEGATIVE Final    Comment: (NOTE) The Xpert Xpress SARS-CoV-2/FLU/RSV plus assay is intended as an aid in the diagnosis of influenza from Nasopharyngeal swab specimens and should not be used as a sole basis for treatment. Nasal washings and aspirates are unacceptable for Xpert Xpress SARS-CoV-2/FLU/RSV testing.  Fact Sheet for Patients: EntrepreneurPulse.com.au  Fact Sheet for Healthcare Providers: IncredibleEmployment.be  This test is not yet  approved or cleared by the [place] FDA and has been authorized for detection and/or diagnosis of SARS-CoV-2 by FDA under an Emergency Use Authorization (EUA). This EUA will remain in effect (meaning this test can be used) for the duration of the COVID-19 declaration under Section 564(b)(1) of the Act, 21 U.S.C. section 360bbb-3(b)(1), unless the authorization is terminated or revoked.  Performed at [facility]   Blood culture (routine single)     Status: None (Preliminary result)   Collection Time: 05/17/20  5:17 PM   Specimen: BLOOD  Result Value Ref Range Status   Specimen Description   Final    BLOOD RIGHT ANTECUBITAL Performed at [facility] [address] [place], [place] [address]    Special Requests   Final    BOTTLES DRAWN AEROBIC ONLY Blood Culture adequate volume Performed at [facility] [address] [place], [place] [address]    Culture   Final    NO GROWTH 2 DAYS Performed at [facility] [address] [place], [place] [address]    Report Status PENDING  Incomplete    Radiology Studies: No results found.    Robbin Loughmiller T. Taylor  If 7PM-7AM, please contact night-coverage [email or web address] 05/19/2020, 1:22 PM

## 2020-05-19 NOTE — Plan of Care (Signed)
  Problem: Health Behavior/Discharge Planning: Goal: Ability to manage health-related needs will improve Outcome: Progressing   

## 2020-05-20 DIAGNOSIS — Z1621 Resistance to vancomycin: Secondary | ICD-10-CM

## 2020-05-20 DIAGNOSIS — B952 Enterococcus as the cause of diseases classified elsewhere: Secondary | ICD-10-CM

## 2020-05-20 DIAGNOSIS — R5383 Other fatigue: Secondary | ICD-10-CM

## 2020-05-20 DIAGNOSIS — I10 Essential (primary) hypertension: Secondary | ICD-10-CM

## 2020-05-20 DIAGNOSIS — R531 Weakness: Secondary | ICD-10-CM

## 2020-05-20 LAB — CBC WITH DIFFERENTIAL/PLATELET
Abs Immature Granulocytes: 0.04 10*3/uL (ref 0.00–0.07)
Basophils Absolute: 0.1 10*3/uL (ref 0.0–0.1)
Basophils Relative: 0 %
Eosinophils Absolute: 0.3 10*3/uL (ref 0.0–0.5)
Eosinophils Relative: 1 %
HCT: 26.7 % — ABNORMAL LOW (ref 36.0–46.0)
Hemoglobin: 7.9 g/dL — ABNORMAL LOW (ref 12.0–15.0)
Immature Granulocytes: 0 %
Lymphocytes Relative: 91 %
Lymphs Abs: 27.5 10*3/uL — ABNORMAL HIGH (ref 0.7–4.0)
MCH: 32.1 pg (ref 26.0–34.0)
MCHC: 29.6 g/dL — ABNORMAL LOW (ref 30.0–36.0)
MCV: 108.5 fL — ABNORMAL HIGH (ref 80.0–100.0)
Monocytes Absolute: 0.5 10*3/uL (ref 0.1–1.0)
Monocytes Relative: 2 %
Neutro Abs: 1.6 10*3/uL — ABNORMAL LOW (ref 1.7–7.7)
Neutrophils Relative %: 6 %
Platelets: 212 10*3/uL (ref 150–400)
RBC: 2.46 MIL/uL — ABNORMAL LOW (ref 3.87–5.11)
RDW: 15.9 % — ABNORMAL HIGH (ref 11.5–15.5)
WBC: 29.9 10*3/uL — ABNORMAL HIGH (ref 4.0–10.5)
nRBC: 0.1 % (ref 0.0–0.2)

## 2020-05-20 LAB — RENAL FUNCTION PANEL
Albumin: 2.7 g/dL — ABNORMAL LOW (ref 3.5–5.0)
Anion gap: 10 (ref 5–15)
BUN: 12 mg/dL (ref 8–23)
CO2: 21 mmol/L — ABNORMAL LOW (ref 22–32)
Calcium: 7.9 mg/dL — ABNORMAL LOW (ref 8.9–10.3)
Chloride: 109 mmol/L (ref 98–111)
Creatinine, Ser: 0.73 mg/dL (ref 0.44–1.00)
GFR, Estimated: >60 mL/min (ref >60)
Glucose, Bld: 93 mg/dL (ref 70–99)
Phosphorus: 3.3 mg/dL (ref 2.5–4.6)
Potassium: 3.3 mmol/L — ABNORMAL LOW (ref 3.5–5.1)
Sodium: 140 mmol/L (ref 135–145)

## 2020-05-20 LAB — URINE CULTURE: Culture: >=100000 — AB

## 2020-05-20 LAB — MAGNESIUM: Magnesium: 1.8 mg/dL (ref 1.7–2.4)

## 2020-05-20 MED ORDER — LINEZOLID 600 MG PO TABS: 600.0000 mg | ORAL_TABLET | Freq: Two times a day (BID) | ORAL | 0 refills | Status: DC

## 2020-05-20 MED ORDER — SODIUM CHLORIDE 0.9 % IV SOLN
1.0000 g | Freq: Four times a day (QID) | INTRAVENOUS | Status: DC
  Administered 2020-05-20: 1 g via INTRAVENOUS
  Filled 2020-05-20: qty 1000
  Filled 2020-05-20: qty 1

## 2020-05-20 MED ORDER — MAGNESIUM SULFATE IN D5W 1-5 GM/100ML-% IV SOLN
1.0000 g | Freq: Once | INTRAVENOUS | Status: AC
  Administered 2020-05-20: 1 g via INTRAVENOUS
  Filled 2020-05-20: qty 100

## 2020-05-20 MED ORDER — LINEZOLID 600 MG PO TABS
600.0000 mg | ORAL_TABLET | Freq: Two times a day (BID) | ORAL | Status: DC
  Administered 2020-05-20: 600 mg via ORAL
  Filled 2020-05-20: qty 1

## 2020-05-20 MED ORDER — POTASSIUM CHLORIDE CRYS ER 20 MEQ PO TBCR
40.0000 meq | EXTENDED_RELEASE_TABLET | ORAL | Status: AC
Start: 2020-05-20 — End: 2020-05-20
  Administered 2020-05-20 (×2): 40 meq via ORAL
  Filled 2020-05-20 (×2): qty 2

## 2020-05-20 MED ORDER — METOPROLOL-HYDROCHLOROTHIAZIDE 100-25 MG PO TABS: 0.5000 | ORAL_TABLET | Freq: Two times a day (BID) | ORAL | Status: DC

## 2020-05-20 NOTE — TOC Benefit Eligibility Note (Signed)
Patient Teacher, English as a foreign language completed.    The patient is currently admitted and upon discharge could be taking Linezolid 600 mg.  The current 7 day co-pay is, $16.00.   The patient is insured through [employer], [place] Patient Advocate Specialist [place] Team Direct Number: [phone]  Fax: [phone]

## 2020-05-20 NOTE — Discharge Summary (Addendum)
Physician Discharge Summary  Sarah [name] IDP:[id number] DOB: 01-11-1941 DOA: 05/17/2020  PCP: Libby Maw, MD  Admit date: 05/17/2020 Discharge date: 05/20/2020  Admitted From: Home Disposition: Home  Recommendations for Outpatient Follow-up:  1. Follow ups as below. 2. Please obtain CBC/BMP/Mag at follow up 3. Please follow up on the following pending results: None  Home Health: PT/aide Equipment/Devices: None required  Discharge Condition: Stable CODE STATUS: DNR/DNI   Follow-up Information    Libby Maw, MD Follow up.   Specialty: Family Medicine Contact information: [place] [address] [phone]        Lucas Mallow, MD. Schedule an appointment as soon as possible for a visit in 1 week(s).   Specialty: Urology Contact information: [place] [id number] [phone]        Orson Slick, MD. Schedule an appointment as soon as possible for a visit in 1 week(s).   Specialty: Hematology and Oncology Contact information: [place] [address] [phone]               Hospital Course: 80 year old F with PMH of CLL with chronic leukocytosis, breast cancer in remission, HTN, HLD and recent hospitalization from 3/17-3/25 for sepsis due to UTI and left UPJ stenosis with left hydronephrosis for which she had left ureteral stent returning with fever, chills, fatigue and abdominal pain, and admitted for severe sepsis due to UTI.  Started on IV ceftriaxone.  However, urine culture grew Enterococcus faecium resistant to ampicillin and vancomycin.  She was transitioned to p.o. Zyvox 600 mg twice daily for 7 days.  Blood cultures NGTD.  Of note, patient was treated with cephalosporin when hospitalized from 3/17-3/25 for UTI but we did not have urine culture at that time.  See individual problem list below for more on hospital course.  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  Renal US without hydronephrosis. -Discharged on p.o. Zyvox 600 mg twice daily for 7 more days. -Outpatient follow-up with PCP and urology as above.  Essential hypertension: Normotensive.  On metoprolol and HCTZ at home. -Advised to resume home Lopressor HCT on 05/25/2019  Chronic macrocytic anemia: H&H relatively stable.  No melena or hematochezia. Recent Labs    05/09/20 0244 05/10/20 0337 05/11/20 0302 05/12/20 0224 05/13/20 0131 05/14/20 0044 05/17/20 1402 05/18/20 0332 05/19/20 0338 05/20/20 0245  HGB 8.4* 9.1* 9.2* 9.2* 8.4* 9.0* 9.1* 8.3* 8.4* 7.9*  -Recheck CBC at follow-up  History of CLL/chronic leukocytosis/lymphocytic predominance:  discussed with patient's oncologist, Dr. Lorenso Courier who does not think this is the cause of patient's intermittent fever or presentation. -Recommended outpatient follow-up with her oncologist in 1 to 2 weeks  Hyperlipidemia -Zetia 10 mg daily,  Breast cancer in remission -Follow-up with oncology as an outpatient  Anxiety/depression -Continue home medications  Addendum Hypokalemia: K3.3. -Received K-Dur 40 mEq x 2 prior to discharge.  Overweight Body mass index is 29.76 kg/m.       Family communication: Discussed discharge plan with patient and patient's daughter over the phone.     Discharge Exam: Vitals:   05/19/20 2100 05/20/20 0516  BP:  113/68  Pulse:  75  Resp:  19  Temp: 100 F (37.8 C) 98.6 F (37 C)  SpO2:  99%    GENERAL: No  apparent distress.  Nontoxic. HEENT: MMM.  Vision and hearing grossly intact.  NECK: Supple.  No apparent JVD.  RESP: On RA.  No IWOB.  Fair aeration bilaterally. CVS:  RRR. Heart sounds normal.  ABD/GI/GU: Bowel sounds present. Soft. Non tender.  MSK/EXT:  Moves  extremities. No apparent deformity. No edema.  SKIN: no apparent skin lesion or wound NEURO: Awake, alert and oriented appropriately.  No apparent focal neuro deficit. PSYCH: Calm. Normal affect.   Discharge Instructions  Discharge Instructions    Call MD for:  extreme fatigue   Complete by: As directed    Call MD for:  persistant dizziness or light-headedness   Complete by: As directed    Call MD for:  persistant nausea and vomiting   Complete by: As directed    Call MD for:  severe uncontrolled pain   Complete by: As directed    Call MD for:  temperature >100.4   Complete by: As directed    Diet - low sodium heart healthy   Complete by: As directed    Discharge instructions   Complete by: As directed    It has been a pleasure taking care of you!  You were hospitalized due to urinary tract infection.  We have started you on antibiotic called Zyvox (linezolid).  You will take this medication for 7 more days.  We also recommend holding your blood pressure medication for the next 4 days.  Please follow-up with your primary care doctor, urologist and oncologist in the next 1 to 2 weeks   Take care,   Increase activity slowly   Complete by: As directed      Allergies as of 05/20/2020      Reactions   Amoxicillin    Augmentin [amoxicillin-pot Clavulanate]    GI upset   Drug [tape]    Sulfa Antibiotics    Childhood    Atenolol Rash   Macrodantin [nitrofurantoin] Rash      Medication List    STOP taking these medications   cephALEXin 250 MG capsule Commonly known as: KEFLEX     TAKE these medications   acetaminophen 325 MG tablet Commonly known as: TYLENOL Take 2 tablets (650 mg total) by mouth every 6 (six) hours as needed for mild pain (or Fever >/= 101).   busPIRone 15 MG tablet Commonly known as: BUSPAR Take 1 tablet (15 mg total) by mouth 2 (two) times daily.   EQ Multivitamins Adult Gummy Chew Chew 1 tablet by mouth in the morning.   ezetimibe 10 MG  tablet Commonly known as: ZETIA Take 10 mg by mouth daily.   fluticasone 50 MCG/ACT nasal spray Commonly known as: FLONASE Place 2 sprays into both nostrils daily.   lansoprazole 30 MG capsule Commonly known as: PREVACID Take 30 mg by mouth daily at 12 noon.   linezolid 600 MG tablet Commonly known as: ZYVOX Take 1 tablet (600 mg total) by mouth every 12 (twelve) hours.   loratadine 10 MG tablet Commonly known as: CLARITIN Take 10 mg by mouth daily as needed for allergies.   metoprolol-hydrochlorothiazide 100-25 MG tablet Commonly known as: LOPRESSOR HCT Take 0.5 tablets by mouth 2 (two) times daily. Start taking on: May 24, 2020 What changed: These instructions start on May 24, 2020. If you are unsure what to do until then, ask your doctor or other care provider.   potassium chloride 10 MEQ tablet Commonly known as: KLOR-CON Take 10 mEq by mouth daily.   venlafaxine XR 150  MG 24 hr capsule Commonly known as: EFFEXOR-XR Take 150 mg by mouth daily with breakfast.       Consultations:  Oncology over the phone  Procedures/Studies:   CT ABDOMEN PELVIS WO CONTRAST  Result Date: 05/08/2020 CLINICAL DATA:  80 year old female with pyelonephritis. EXAM: CT ABDOMEN AND PELVIS WITHOUT CONTRAST TECHNIQUE: Multidetector CT imaging of the abdomen and pelvis was performed following the standard protocol without IV contrast. COMPARISON:  Renal ultrasound yesterday. FINDINGS: Lower chest: Mild cardiomegaly. Tortuous descending thoracic aorta. No pericardial effusion. Minimal lung base atelectasis. Hepatobiliary: Mildly nodular liver contour. Circumscribed round low-density area measuring 12 mm on series 3, image 21 has simple fluid density and is likely a benign cyst. No other discrete liver lesion on this noncontrast exam. Diminutive or absent gallbladder. Pancreas: Negative noncontrast pancreas. Spleen: Splenomegaly. Estimated splenic volume 535 mL (normal splenic volume range 83 -  412 mL). No discrete splenic lesion. Adrenals/Urinary Tract: Negative adrenal glands. Right kidney appears nonobstructed although there is a round 9 mm calculus in the right renal pelvis (series 6, image 82). Right ureter is decompressed. The distal ureter is difficult to delineate but appears to remain decompressed to the to the bladder. There is moderate to severe left hydronephrosis. But the left ureteropelvic junction appears abruptly tapered on coronal image 79. Only a punctate left intrarenal calculus is identified. No left hydroureter is evident. The left UVJ appears normal. Diminutive urinary bladder, with mild bladder wall thickening in the region of sigmoid colon inflammatory stranding. See below. Numerous pelvic phleboliths. Stomach/Bowel: Retained stool in the rectum with no rectal wall thickening or inflammation identified. However, the upstream sigmoid colon is thickened and with mild mesenteric stranding as seen on coronal image 68 which extends to the nearby bladder dome. There are diverticula in the region. This inflammation is in a segment of fiber 6 cm. Diverticulosis in the upstream descending colon without active inflammation. Mild retained stool proximal to the sigmoid. Diminutive or absent appendix. Terminal ileum also remarkable for fairly extensive diverticulosis which tracks into the upstream distal small bowel. No dilated small bowel. No free air or free fluid. No other convincing acute bowel inflammation. Stomach and duodenum are decompressed. Vascular/Lymphatic: Aortoiliac calcified atherosclerosis. Tortuous abdominal aorta. Vascular patency is not evaluated in the absence of IV contrast. Additionally, there is nonspecific appearing lymphadenopathy in the small bowel mesentery (coronal images 54-59) with individual lymph nodes up to 13 mm short axis. Associated haziness of the root of the mesentery. Furthermore, there is bulky left side retroperitoneal lymphadenopathy, with individual  pararenal nodes up to 23 mm short axis. The lymphadenopathy abates at the pelvic inlet. No inguinal lymphadenopathy. Reproductive: Absent uterus.  Diminutive or absent ovaries. Other: Mild presacral stranding is nonspecific. No pelvic free fluid. Musculoskeletal: Several chronic left posterior rib fractures. Grade 1 degenerative lower lumbar spondylolisthesis with severe facet degeneration. Severe chronic disc degeneration at the lumbosacral junction. No acute osseous abnormality identified. IMPRESSION: 1. Moderate to severe retroperitoneal and mesenteric lymphadenopathy suspicious for Lymphoma, Leukemia, or less likely other lymphoproliferative disorder. A CT guided left pararenal lymph node needle biopsy should be feasible. 2. Sigmoid colon appears inflamed compatible with acute diverticulitis or colitis. Mild secondary inflammation of the nearby Bladder Dome. No evidence of colovesical fistula or abscess. Underlying extensive small bowel diverticulosis. But no other bowel inflammation identified on this noncontrast exam. 3. Moderate to severe left hydronephrosis but with abrupt tapering at the left ureteropelvic junction suggesting UPJ stenosis. Right greater than left nephrolithiasis but no obstructing  calculus is identified. No strong evidence of pyelonephritis, although IV contrast would be necessary for that CT diagnosis. 4. Splenomegaly, which may be secondary to #1 rather than due to chronic liver disease. 5. Aortic Atherosclerosis (ICD10-I70.0). Electronically Signed   By: Genevie Ann M.D.   On: 05/08/2020 09:16   NM Renal Imaging Flow W/Pharm  Result Date: 05/10/2020 CLINICAL DATA:  Evaluate hydronephrosis of the left kidney. Status post bilobed plasty. EXAM: NUCLEAR MEDICINE RENAL SCAN WITH DIURETIC ADMINISTRATION TECHNIQUE: Radionuclide angiographic and sequential renal images were obtained after intravenous injection of radiopharmaceutical. Imaging was continued during slow intravenous injection of  Lasix approximately 15 minutes after the start of the examination. RADIOPHARMACEUTICALS:  5.5 mCi Technetium-57m MAG3 IV COMPARISON:  05/08/2020 FINDINGS: Flow: Normal flow to the right kidney. Markedly diminished in delayed perfusion to the left kidney. Left renogram: Asymmetric delayed cortical uptake, excretion, and clearance of the radiopharmaceutical. On the postvoid images there is persistent radiopharmaceutical accumulation within the dilated left renal collecting system. Right renogram: Normal cortical uptake, excretion in clearance of the radiopharmaceutical. Differential: Left kidney = 23 % Right kidney = 77 % T1/2 post Lasix : Technical difficulties with Lasix administration resulted in the diuretic being administered at 80 minutes with only 10 minutes remaining. IMPRESSION: 1. Normally functioning right kidney. 2. Delayed left renal perfusion, cortical uptake, excretion and clearance of the radiopharmaceutical consistent with obstructive uropathy. Electronically Signed   By: Kerby Moors M.D.   On: 05/10/2020 14:35   DG Retrograde Pyelogram  Result Date: 05/12/2020 CLINICAL DATA:  80 year old female with a history of left-sided hydronephrosis with history of prior pyloroplasty EXAM: RETROGRADE PYELOGRAM COMPARISON:  CT 05/08/2020 FINDINGS: Limited fluoroscopic images during retrograde pyelogram. Images demonstrate partial opacification of the left-sided renal pelvis and collecting system with pelvicaliectasis and hydronephrosis. The visualized ureter is of relatively normal caliber. Final image demonstrates placement of a double-J ureteral stent IMPRESSION: Limited fluoroscopic spot images during retrograde pyelogram demonstrates treatment of the left collecting system with placement of a double-J stent. Please refer to the dictated operative report for full details of intraoperative findings and procedure. Electronically Signed   By: Corrie Mckusick D.O.   On: 05/12/2020 13:38   US RENAL  Result  Date: 05/17/2020 CLINICAL DATA:  UTI, recent left ureteral stent EXAM: RENAL / URINARY TRACT ULTRASOUND COMPLETE COMPARISON:  Renal ultrasound May 07, 2020 FINDINGS: Right Kidney: Renal measurements: 10.7 x 5.2 x 4.1 cm = volume: 137 mL. Echogenicity within normal limits. Nonobstructive 1 cm renal calculus. No mass or hydronephrosis visualized. Left Kidney: Renal measurements: 10.5 x 5.7 x 5.3 cm = volume: 166 mL. Echogenicity within normal limits. No mass or hydronephrosis visualized. Bladder: Appears normal for degree of bladder distention. Curvilinear echogenic focus in the bladder likely representing a stent. Other: None. IMPRESSION: 1. No hydronephrosis Electronically Signed   By: Dahlia Bailiff MD   On: 05/17/2020 20:53   US RENAL  Result Date: 05/07/2020 CLINICAL DATA:  Pyelonephritis. EXAM: RENAL / URINARY TRACT ULTRASOUND COMPLETE COMPARISON:  None. FINDINGS: Right Kidney: Renal measurements: 11.4 x 5.1 x 5.1 cm = volume: 154.6 mL. Echogenicity within normal limits. No mass or hydronephrosis visualized. Left Kidney: Renal measurements: 10.8 x 5.3 x 6.6 cm = volume: 198.8 mL. Moderate hydronephrosis identified on the left. Bladder: Appears normal for degree of bladder distention. Other: The spleen is enlarged measuring 13.8 x 12.7 x 5.8 cm with a volume of 537 cc. IMPRESSION: 1. Moderate left hydronephrosis of uncertain chronicity. Acute/active obstruction not excluded  on this study. Recommend clinical correlation. CT imaging could further evaluate for an underlying cause. 2. Splenomegaly. Electronically Signed   By: Dorise Bullion III M.D   On: 05/07/2020 16:55   DG Chest Port 1 View  Result Date: 05/17/2020 CLINICAL DATA:  Fever EXAM: PORTABLE CHEST 1 VIEW COMPARISON:  May 06, 2020 FINDINGS: There is no edema or airspace opacity. Heart size and pulmonary vascularity are normal. No adenopathy. There are several healed rib fractures on the left. There is postoperative change in the right  humerus. IMPRESSION: No edema or airspace opacity.  Stable cardiac silhouette. Electronically Signed   By: Lowella Grip III M.D.   On: 05/17/2020 15:08   DG Chest Portable 1 View  Result Date: 05/06/2020 CLINICAL DATA:  Fever, chills and cough. EXAM: PORTABLE CHEST 1 VIEW COMPARISON:  None. FINDINGS: The lungs are hyperinflated. There is no evidence of acute infiltrate, pleural effusion or pneumothorax. The heart size and mediastinal contours are within normal limits. Multiple chronic left-sided rib fractures are seen. A radiopaque fusion plate and screws are seen within the proximal right humerus. IMPRESSION: No active cardiopulmonary disease. Electronically Signed   By: Virgina Norfolk M.D.   On: 05/06/2020 15:41        The results of significant diagnostics from this hospitalization (including imaging, microbiology, ancillary and laboratory) are listed below for reference.     Microbiology: Recent Results (from the past 240 hour(s))  Culture, blood (Routine x 2)     Status: None (Preliminary result)   Collection Time: 05/17/20  1:34 PM   Specimen: BLOOD  Result Value Ref Range Status   Specimen Description BLOOD BLOOD LEFT FOREARM  Final   Special Requests   Final    BOTTLES DRAWN AEROBIC AND ANAEROBIC BOTTLES DRAWN AEROBIC AND ANAEROBIC   Culture   Final    NO GROWTH 3 DAYS Performed at [facility], [address] [place], [place] [address]    Report Status PENDING  Incomplete  Culture, blood (Routine x 2)     Status: None (Preliminary result)   Collection Time: 05/17/20  1:39 PM   Specimen: BLOOD  Result Value Ref Range Status   Specimen Description BLOOD RIGHT ANTECUBITAL  Final   Special Requests   Final    BOTTLES DRAWN AEROBIC AND ANAEROBIC Blood Culture adequate volume   Culture   Final    NO GROWTH 3 DAYS Performed at [facility], [place] [address], [place],  [address]    Report Status PENDING  Incomplete  Urine culture     Status: Abnormal    Collection Time: 05/17/20  2:02 PM   Specimen: In/Out Cath Urine  Result Value Ref Range Status   Specimen Description   Final    IN/OUT CATH URINE Performed at [facility]    Special Requests NONE Performed at [facility]   Final   Culture >=100,000 COLONIES/mL ENTEROCOCCUS FAECIUM (A)  Final   Report Status 05/20/2020 FINAL  Final   Organism ID, Bacteria ENTEROCOCCUS FAECIUM (A)  Final      Susceptibility   Enterococcus faecium - MIC*    AMPICILLIN >=32 RESISTANT Resistant     NITROFURANTOIN 32 SENSITIVE Sensitive     VANCOMYCIN >=32 RESISTANT Resistant     LINEZOLID 2 SENSITIVE Sensitive     * >=100,000 COLONIES/mL ENTEROCOCCUS FAECIUM  Resp Panel by RT-PCR (Flu A&B, Covid) Nasopharyngeal Swab     Status: None   Collection Time:  05/17/20  3:02 PM   Specimen: Nasopharyngeal Swab; Nasopharyngeal(NP) swabs in vial transport medium  Result Value Ref Range Status   SARS Coronavirus 2 by RT PCR NEGATIVE NEGATIVE Final    Comment: (NOTE) SARS-CoV-2 target nucleic acids are NOT DETECTED.  The SARS-CoV-2 RNA is generally detectable in upper respiratory specimens during the acute phase of infection. The lowest concentration of SARS-CoV-2 viral copies this assay can detect is 138 copies/mL. A negative result does not preclude SARS-Cov-2 infection and should not be used as the sole basis for treatment or other patient management decisions. A negative result may occur with  improper specimen collection/handling, submission of specimen other than nasopharyngeal swab, presence of viral mutation(s) within the areas targeted by this assay, and inadequate number of viral copies(<138 copies/mL). A negative result must be combined with clinical observations, patient history, and epidemiological information. The expected result is Negative.  Fact Sheet for Patients:  EntrepreneurPulse.com.au  Fact Sheet for Healthcare Providers:   IncredibleEmployment.be  This test is no t yet approved or cleared by the [place] FDA and  has been authorized for detection and/or diagnosis of SARS-CoV-2 by FDA under an Emergency Use Authorization (EUA). This EUA will remain  in effect (meaning this test can be used) for the duration of the COVID-19 declaration under Section 564(b)(1) of the Act, 21 U.S.C.section 360bbb-3(b)(1), unless the authorization is terminated  or revoked sooner.       Influenza A by PCR NEGATIVE NEGATIVE Final   Influenza B by PCR NEGATIVE NEGATIVE Final    Comment: (NOTE) The Xpert Xpress SARS-CoV-2/FLU/RSV plus assay is intended as an aid in the diagnosis of influenza from Nasopharyngeal swab specimens and should not be used as a sole basis for treatment. Nasal washings and aspirates are unacceptable for Xpert Xpress SARS-CoV-2/FLU/RSV testing.  Fact Sheet for Patients: EntrepreneurPulse.com.au  Fact Sheet for Healthcare Providers: IncredibleEmployment.be  This test is not yet approved or cleared by the [place] FDA and has been authorized for detection and/or diagnosis of SARS-CoV-2 by FDA under an Emergency Use Authorization (EUA). This EUA will remain in effect (meaning this test can be used) for the duration of the COVID-19 declaration under Section 564(b)(1) of the Act, 21 U.S.C. section 360bbb-3(b)(1), unless the authorization is terminated or revoked.  Performed at [facility]   Blood culture (routine single)     Status: None (Preliminary result)   Collection Time: 05/17/20  5:17 PM   Specimen: BLOOD  Result Value Ref Range Status   Specimen Description   Final    BLOOD RIGHT ANTECUBITAL Performed at [facility] [address], [place], [place] [address]    Special Requests   Final    BOTTLES DRAWN AEROBIC ONLY Blood Culture adequate volume Performed at [facility] [address] [place], [place] [address]    Culture   Final    NO GROWTH 3 DAYS Performed at [facility], [place] [address], [place],  [address]    Report Status PENDING  Incomplete     Labs:  CBC: Recent Labs  Lab 05/14/20 0044 05/17/20 1402 05/18/20 0332 05/19/20 0338 05/20/20 0245  WBC 30.8* 50.6* 36.7* 31.4* 29.9*  NEUTROABS 3.1 9.1*  --   --  1.6*  HGB 9.0* 9.1* 8.3* 8.4* 7.9*  HCT 28.6* 29.3* 27.2* 27.9* 26.7*  MCV 104.8* 103.9* 105.8* 106.1* 108.5*  PLT 218 225 208 211 212   BMP &GFR Recent Labs  Lab 05/14/20  3893 05/17/20 1402 05/18/20 0332 05/19/20 0338 05/20/20 0245  NA 137 137 138 139 140  K 4.0 4.7 4.8 3.9 3.3*  CL 103 99 105 107 109  CO2 25 26 26 22  21*  GLUCOSE 102* 113* 123* 103* 93  BUN 20 17 19 15 12   CREATININE 0.99 0.97 1.01* 0.77 0.73  CALCIUM 8.1* 8.6* 8.4* 8.3* 7.9*  MG  --   --   --   --  1.8  PHOS  --   --   --   --  3.3   Estimated Creatinine Clearance: 54.8 mL/min (by C-G formula based on SCr of 0.73 mg/dL). Liver & Pancreas: Recent Labs  Lab 05/14/20 0044 05/17/20 1402 05/20/20 0245  AST 20 30  --   ALT 14 14  --   ALKPHOS 109 118  --   BILITOT 0.5 0.6  --   PROT 4.8* 5.7*  --   ALBUMIN 2.8* 3.7 2.7*   No results for input(s): LIPASE, AMYLASE in the last 168 hours. No results for input(s): AMMONIA in the last 168 hours. Diabetic: No results for input(s): HGBA1C in the last 72 hours. No results for input(s): GLUCAP in the last 168 hours. Cardiac Enzymes: No results for input(s): CKTOTAL, CKMB, CKMBINDEX, TROPONINI in the last 168 hours. No results for input(s): PROBNP in the last 8760 hours. Coagulation Profile: Recent Labs  Lab 05/17/20 1402  INR 1.0   Thyroid Function Tests: No results for input(s): TSH, T4TOTAL, FREET4, T3FREE, THYROIDAB in the last 72 hours. Lipid Profile: No results for input(s): CHOL, HDL, LDLCALC, TRIG, CHOLHDL, LDLDIRECT in the last 72 hours. Anemia Panel: No  results for input(s): VITAMINB12, FOLATE, FERRITIN, TIBC, IRON, RETICCTPCT in the last 72 hours. Urine analysis:    Component Value Date/Time   COLORURINE YELLOW 05/17/2020 1402   APPEARANCEUR HAZY (A) 05/17/2020 1402   LABSPEC 1.022 05/17/2020 1402   PHURINE 6.0 05/17/2020 1402   GLUCOSEU NEGATIVE 05/17/2020 1402   GLUCOSEU NEGATIVE 12/11/2019 1142   HGBUR MODERATE (A) 05/17/2020 1402   BILIRUBINUR NEGATIVE 05/17/2020 1402   BILIRUBINUR negative 02/10/2020 1426   KETONESUR NEGATIVE 05/17/2020 1402   PROTEINUR 30 (A) 05/17/2020 1402   UROBILINOGEN 0.2 02/10/2020 1426   UROBILINOGEN 0.2 12/11/2019 1142   NITRITE NEGATIVE 05/17/2020 1402   LEUKOCYTESUR LARGE (A) 05/17/2020 1402   Sepsis Labs: Invalid input(s): PROCALCITONIN, LACTICIDVEN   Time coordinating discharge: 40 minutes  SIGNED:  Mercy Riding, MD  [facility] 05/20/2020, 10:04 AM  If 7PM-7AM, please contact night-coverage [email or web address]

## 2020-05-20 NOTE — Care Management Important Message (Signed)
Important Message  Patient Details IM Letter given to the Patient. Name: Sarah [name] MRN: [id number] Date of Birth: 08-Jan-1941   Medicare Important Message Given:  Yes     Kerin Salen 05/20/2020, 11:00 AM

## 2020-05-20 NOTE — Progress Notes (Signed)
PT Cancellation Note  Patient Details Name: Sarah [name] MRN: [id number] DOB: Mar 17, 1940   Cancelled Treatment:    Reason Eval/Treat Not Completed: Patient declined, no reason specified. Pt declines therapy, states she is going home today and wants to "save my energy to get dressed". Will check back tomorrow if pt still in hospital.    Talbot Grumbling PT, DPT 05/20/20, 10:30 AM

## 2020-05-22 LAB — CULTURE, BLOOD (SINGLE)
Culture: NO GROWTH
Special Requests: ADEQUATE

## 2020-05-22 LAB — CULTURE, BLOOD (ROUTINE X 2)
Culture: NO GROWTH
Culture: NO GROWTH
Special Requests: ADEQUATE

## 2020-05-24 ENCOUNTER — Telehealth: Payer: Self-pay | Admitting: Family Medicine

## 2020-05-24 NOTE — Telephone Encounter (Signed)
Shanon Brow from [facility] did a resumption of care visit with pt on 05/22/20. He is now wanting verbal orders for PT 2times a week for 6 weeks and 1time a week for 1 week.

## 2020-05-24 NOTE — Telephone Encounter (Signed)
Please see message and advise.  Thank you. ° °

## 2020-05-24 NOTE — Telephone Encounter (Signed)
He is also wanting a Home Health Aid 2times a week for 5 weeks. He is also needing an OT evaluation for pt. It is OK leave a verbal authorization on his vm. Please contact Shanon Brow at [phone].

## 2020-05-25 ENCOUNTER — Other Ambulatory Visit: Payer: Self-pay

## 2020-05-25 ENCOUNTER — Encounter: Payer: Self-pay | Admitting: Family Medicine

## 2020-05-25 ENCOUNTER — Ambulatory Visit (INDEPENDENT_AMBULATORY_CARE_PROVIDER_SITE_OTHER): Payer: Medicare Other | Admitting: Family Medicine

## 2020-05-25 ENCOUNTER — Telehealth: Payer: Self-pay

## 2020-05-25 VITALS — BP 98/64 | HR 96 | Temp 97.3°F | Ht 63.0 in | Wt 161.2 lb

## 2020-05-25 DIAGNOSIS — I252 Old myocardial infarction: Secondary | ICD-10-CM

## 2020-05-25 DIAGNOSIS — I959 Hypotension, unspecified: Secondary | ICD-10-CM

## 2020-05-25 DIAGNOSIS — C9110 Chronic lymphocytic leukemia of B-cell type not having achieved remission: Secondary | ICD-10-CM

## 2020-05-25 DIAGNOSIS — Z09 Encounter for follow-up examination after completed treatment for conditions other than malignant neoplasm: Secondary | ICD-10-CM | POA: Insufficient documentation

## 2020-05-25 DIAGNOSIS — E876 Hypokalemia: Secondary | ICD-10-CM

## 2020-05-25 HISTORY — DX: Encounter for follow-up examination after completed treatment for conditions other than malignant neoplasm: Z09

## 2020-05-25 HISTORY — DX: Hypokalemia: E87.6

## 2020-05-25 LAB — COMPREHENSIVE METABOLIC PANEL
ALT: 12 U/L (ref 0–35)
AST: 15 U/L (ref 0–37)
Albumin: 3.7 g/dL (ref 3.5–5.2)
Alkaline Phosphatase: 102 U/L (ref 39–117)
BUN: 14 mg/dL (ref 6–23)
CO2: 22 mEq/L (ref 19–32)
Calcium: 9 mg/dL (ref 8.4–10.5)
Chloride: 104 mEq/L (ref 96–112)
Creatinine, Ser: 1.1 mg/dL (ref 0.40–1.20)
GFR: 47.48 mL/min — ABNORMAL LOW (ref >60.00)
Glucose, Bld: 128 mg/dL — ABNORMAL HIGH (ref 70–99)
Potassium: 4.1 mEq/L (ref 3.5–5.1)
Sodium: 141 mEq/L (ref 135–145)
Total Bilirubin: 0.4 mg/dL (ref 0.2–1.2)
Total Protein: 5.3 g/dL — ABNORMAL LOW (ref 6.0–8.3)

## 2020-05-25 LAB — CBC
HCT: 28.5 % — ABNORMAL LOW (ref 36.0–46.0)
Hemoglobin: 9.2 g/dL — ABNORMAL LOW (ref 12.0–15.0)
MCHC: 32.2 g/dL (ref 30.0–36.0)
MCV: 100.7 fl — ABNORMAL HIGH (ref 78.0–100.0)
Platelets: 247 10*3/uL (ref 150.0–400.0)
RBC: 2.82 Mil/uL — ABNORMAL LOW (ref 3.87–5.11)
RDW: 16.8 % — ABNORMAL HIGH (ref 11.5–15.5)
WBC: 37.7 10*3/uL (ref 4.0–10.5)

## 2020-05-25 NOTE — Telephone Encounter (Signed)
Received critical lab from [place]. WBC was 37.7. This nurse made [place] NP aware.

## 2020-05-25 NOTE — Progress Notes (Addendum)
Established Patient Office Visit  Subjective:  Patient ID: Sarah [name], female    DOB: [birth year]  Age: 80 y.o. MRN: [id number]  CC:  Chief Complaint  Patient presents with  . Hospitalization Follow-up    Hospital follow up, patient  still seems to have no energy. Some SOB     HPI Sarah [name] presents for hospital discharge follow-up status post urine sepsis with left UPJ stenosis and left hydronephrosis.  Patient remains hypotensive.  She was advised to start metoprolol with HCT after returning home.  Blood pressure remains low.  She had been taking low doses of metoprolol tartrate twice daily prior to admission.  She has no history of edema or CHF.  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  She has been statin intolerant and currently takes Zetia.  History of CLL with chronically elevated white count.  Past Medical History:  Diagnosis Date  . Allergy   . CLL (chronic lymphocytic leukemia) ([place])   . GERD (gastroesophageal reflux disease)   . Gout   . HLD (hyperlipidemia)   . Hypertension     Past Surgical History:  Procedure Laterality Date  . ABDOMINAL HYSTERECTOMY    . APPENDECTOMY    . CHOLECYSTECTOMY    . CYSTOSCOPY WITH STENT PLACEMENT Left 05/12/2020   Procedure: CYSTOSCOPY WITH STENT PLACEMENT;  Surgeon: Lucas Mallow, MD;  Location: [place];  Service: Urology;  Laterality: Left;  . KIDNEY SURGERY    . MOHS SURGERY      Family History  Problem Relation Age of Onset  . Colon cancer Mother   . Heart attack Father     Social History   Socioeconomic History  . Marital status: Widowed    Spouse name: Not on file  . Number of children: Not on file  . Years of education: Not on file  . Highest education level: Not on file  Occupational History  . Not on file  Tobacco Use  . Smoking status: Never Smoker  . Smokeless tobacco: Never Used  Vaping Use  . Vaping Use: Never used  Substance and Sexual Activity  . Alcohol use: Not Currently     Comment: Rare social drinking  . Drug use: Never  . Sexual activity: Not Currently  Other Topics Concern  . Not on file  Social History Narrative  . Not on file   Social Determinants of Health   Financial Resource Strain: Low Risk   . Difficulty of Paying Living Expenses: Not hard at all  Food Insecurity: No Food Insecurity  . Worried About Charity fundraiser in the Last Year: Never true  . Ran Out of Food in the Last Year: Never true  Transportation Needs: No Transportation Needs  . Lack of Transportation (Medical): No  . Lack of Transportation (Non-Medical): No  Physical Activity: Inactive  . Days of Exercise per Week: 0 days  . Minutes of Exercise per Session: 0 min  Stress: No Stress Concern Present  . Feeling of Stress : Not at all  Social Connections: Moderately Isolated  . Frequency of Communication with Friends and Family: More than three times a week  . Frequency of Social Gatherings with Friends and Family: Once a week  . Attends Religious Services: 1 to 4 times per year  . Active Member of Clubs or Organizations: No  . Attends Archivist Meetings: Never  . Marital Status: Widowed  Intimate Partner Violence: Not At Risk  . Fear of  Current or Ex-Partner: No  . Emotionally Abused: No  . Physically Abused: No  . Sexually Abused: No    Outpatient Medications Prior to Visit  Medication Sig Dispense Refill  . acetaminophen (TYLENOL) 325 MG tablet Take 2 tablets (650 mg total) by mouth every 6 (six) hours as needed for mild pain (or Fever >/= 101).    . busPIRone (BUSPAR) 15 MG tablet Take 1 tablet (15 mg total) by mouth 2 (two) times daily. 180 tablet 1  . ezetimibe (ZETIA) 10 MG tablet Take 10 mg by mouth daily.    . fluticasone (FLONASE) 50 MCG/ACT nasal spray Place 2 sprays into both nostrils daily. 16 g 6  . lansoprazole (PREVACID) 30 MG capsule Take 30 mg by mouth daily at 12 noon.    Marland Kitchen linezolid (ZYVOX) 600 MG tablet Take 1 tablet (600 mg total) by  mouth every 12 (twelve) hours. 14 tablet 0  . loratadine (CLARITIN) 10 MG tablet Take 10 mg by mouth daily as needed for allergies.    . Multiple Vitamins-Minerals (EQ MULTIVITAMINS ADULT GUMMY) CHEW Chew 1 tablet by mouth in the morning.    . potassium chloride (KLOR-CON) 10 MEQ tablet Take 10 mEq by mouth daily.    . Probiotic Product (CULTURELLE PROBIOTICS PO) Take by mouth.    . venlafaxine XR (EFFEXOR-XR) 150 MG 24 hr capsule Take 150 mg by mouth daily with breakfast.    . cefdinir (OMNICEF) 300 MG capsule TAKE 2 CAPSULES (600 MG TOTAL) BY MOUTH DAILY FOR 1 DAY. (Patient not taking: Reported on 05/25/2020) 2 capsule 0  . metoprolol-hydrochlorothiazide (LOPRESSOR HCT) 100-25 MG tablet Take 0.5 tablets by mouth 2 (two) times daily.     No facility-administered medications prior to visit.    Allergies  Allergen Reactions  . Amoxicillin   . Augmentin [Amoxicillin-Pot Clavulanate]     GI upset   . Drug [Tape]   . Sulfa Antibiotics     Childhood   . Atenolol Rash  . Macrodantin [Nitrofurantoin] Rash    ROS Review of Systems  Constitutional: Negative.   HENT: Negative.   Eyes: Negative for photophobia.  Respiratory: Negative.   Cardiovascular: Negative.   Gastrointestinal: Negative for abdominal pain and constipation.  Endocrine: Negative for polyphagia and polyuria.  Skin: Negative for pallor and rash.  Allergic/Immunologic: Positive for immunocompromised state.  Neurological: Positive for light-headedness.  Psychiatric/Behavioral: Negative.       Objective:    Physical Exam Vitals and nursing note reviewed.  Constitutional:      General: She is not in acute distress.    Appearance: She is ill-appearing. She is not toxic-appearing or diaphoretic.  HENT:     Head: Normocephalic and atraumatic.     Right Ear: External ear normal.     Left Ear: External ear normal.  Eyes:     Extraocular Movements: Extraocular movements intact.     Conjunctiva/sclera: Conjunctivae  normal.  Cardiovascular:     Rate and Rhythm: Normal rate and regular rhythm.  Pulmonary:     Effort: Pulmonary effort is normal.     Breath sounds: Normal breath sounds.  Abdominal:     General: Bowel sounds are normal.  Skin:    General: Skin is warm and dry.  Neurological:     Mental Status: She is oriented to person, place, and time.  Psychiatric:        Mood and Affect: Mood normal.        Behavior: Behavior normal.  BP 98/64   Pulse 96   Temp (!) 97.3 F (36.3 C) (Temporal)   Ht 5\' 3"  (1.6 m)   Wt 161 lb 3.2 oz (73.1 kg)   SpO2 96%   BMI 28.56 kg/m  Wt Readings from Last 3 Encounters:  05/25/20 161 lb 3.2 oz (73.1 kg)  05/18/20 167 lb 15.9 oz (76.2 kg)  05/12/20 167 lb 15.9 oz (76.2 kg)     Health Maintenance Due  Topic Date Due  . TETANUS/TDAP  Never done  . DEXA SCAN  Never done  . PNA vac Low Risk Adult (1 of 2 - PCV13) Never done    There are no preventive care reminders to display for this patient.  No results found for: TSH Lab Results  Component Value Date   WBC 37.7 Repeated and verified X2. (HH) 05/25/2020   HGB 9.2 (L) 05/25/2020   HCT 28.5 (L) 05/25/2020   MCV 100.7 (H) 05/25/2020   PLT 247.0 05/25/2020   Lab Results  Component Value Date   NA 141 05/25/2020   K 4.1 05/25/2020   CO2 22 05/25/2020   GLUCOSE 128 (H) 05/25/2020   BUN 14 05/25/2020   CREATININE 1.10 05/25/2020   BILITOT 0.4 05/25/2020   ALKPHOS 102 05/25/2020   AST 15 05/25/2020   ALT 12 05/25/2020   PROT 5.3 (L) 05/25/2020   ALBUMIN 3.7 05/25/2020   CALCIUM 9.0 05/25/2020   ANIONGAP 10 05/20/2020   GFR 47.48 (L) 05/25/2020   No results found for: CHOL No results found for: HDL No results found for: LDLCALC No results found for: TRIG No results found for: CHOLHDL No results found for: HGBA1C    Assessment & Plan:   Problem List Items Addressed This Visit      Cardiovascular and Mediastinum   Hypotension - Primary   Relevant Orders   CBC (Completed)    Comprehensive metabolic panel (Completed)     Other   History of MI (myocardial infarction)   Relevant Orders   Ambulatory referral to Cardiology   CLL (chronic lymphocytic leukemia) ([place])   Relevant Orders   CBC (Completed)   Ambulatory referral to Hematology / [facility] discharge follow-up   Hypokalemia   Relevant Orders   Comprehensive metabolic panel (Completed)      No orders of the defined types were placed in this encounter.   Follow-up: Return in about 2 weeks (around 06/08/2020), or Hold metoprolol.    Libby Maw, MD

## 2020-05-25 NOTE — Telephone Encounter (Signed)
Go ahead and authorize it. That is okay.

## 2020-05-26 ENCOUNTER — Telehealth: Payer: Self-pay | Admitting: Hematology and Oncology

## 2020-05-26 NOTE — Telephone Encounter (Signed)
Scheduled app per 4/6 sch msg. Called pt, no answer. Left msg with appt date and time.

## 2020-05-26 NOTE — Addendum Note (Signed)
Addended by: Abelino Derrick A on: 05/26/2020 11:41 AM   Modules accepted: Orders

## 2020-05-26 NOTE — Telephone Encounter (Signed)
LDM for Sarah [name], letting him know that Dr. Ethelene Hal gave the ok for recommendation.

## 2020-05-28 ENCOUNTER — Other Ambulatory Visit: Payer: Self-pay

## 2020-05-28 ENCOUNTER — Encounter: Payer: Self-pay | Admitting: Hematology and Oncology

## 2020-05-28 ENCOUNTER — Inpatient Hospital Stay: Payer: Medicare Other | Attending: Hematology and Oncology | Admitting: Hematology and Oncology

## 2020-05-28 ENCOUNTER — Other Ambulatory Visit: Payer: Self-pay | Admitting: Hematology and Oncology

## 2020-05-28 VITALS — BP 123/72 | HR 90 | Temp 97.1°F | Resp 18 | Wt 161.6 lb

## 2020-05-28 DIAGNOSIS — Z170 Estrogen receptor positive status [ER+]: Secondary | ICD-10-CM | POA: Diagnosis not present

## 2020-05-28 DIAGNOSIS — C9110 Chronic lymphocytic leukemia of B-cell type not having achieved remission: Secondary | ICD-10-CM

## 2020-05-28 DIAGNOSIS — Z853 Personal history of malignant neoplasm of breast: Secondary | ICD-10-CM | POA: Insufficient documentation

## 2020-05-28 DIAGNOSIS — Z0000 Encounter for general adult medical examination without abnormal findings: Secondary | ICD-10-CM | POA: Diagnosis not present

## 2020-05-28 DIAGNOSIS — Z79899 Other long term (current) drug therapy: Secondary | ICD-10-CM | POA: Insufficient documentation

## 2020-05-28 DIAGNOSIS — C8590 Non-Hodgkin lymphoma, unspecified, unspecified site: Secondary | ICD-10-CM | POA: Diagnosis not present

## 2020-05-31 ENCOUNTER — Other Ambulatory Visit: Payer: Self-pay | Admitting: Urology

## 2020-06-01 ENCOUNTER — Telehealth: Payer: Self-pay

## 2020-06-01 ENCOUNTER — Encounter: Payer: Self-pay | Admitting: Hematology and Oncology

## 2020-06-01 NOTE — Telephone Encounter (Signed)
TC placed to patient/daughter. OT currently visiting patient. Daughter requested for a phone call later.

## 2020-06-01 NOTE — Progress Notes (Signed)
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 No treatment pursued at that time.  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  4) 06/14/15-received R-Benda, but d/c due to poor tolerance 5) 05/06/18-06/26/2018: ibrutinib therapy, d/c due to poor tolerance 6) 09/02/2019: last visit with Dr. Phillip Heal at the [facility] clinic in [place], [place].  6) 10/03/2019: establish care with Dr. 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 female with medical history significant for CLL and breast cancer in remission who presents for a follow up visit. The patient's last visit was on 04/26/2020. In the interim since the last visit Sarah [name] was admitted to [facility] hospital from 3/28-3/31/2022 with another UTI.   On exam today Sarah [name] notes she has been well since her discharge from the hospital.  She reports that she has had no fevers, no chills since she got home.  She is aware that she has a multidrug-resistant bacteria which grew in her urine.  Her primary care provider is interested in putting her back on continuous low-dose antibiotics.  She reports that she has been feeling better though her blood pressure is a little lower than usual.  She otherwise denies having any issues today with fevers, chills, sweats, nausea, vomiting or diarrhea.  Her weight has been stable.  A full 10 point ROS is listed below.  MEDICAL HISTORY:  Past  Medical History:  Diagnosis Date  . Allergy   . CLL (chronic lymphocytic leukemia) ([place])   . GERD (gastroesophageal reflux disease)   . Gout   . HLD (hyperlipidemia)   . Hypertension     SURGICAL HISTORY: Past Surgical History:  Procedure Laterality Date  . ABDOMINAL HYSTERECTOMY    . APPENDECTOMY    . CHOLECYSTECTOMY    . CYSTOSCOPY WITH STENT PLACEMENT Left 05/12/2020   Procedure: CYSTOSCOPY WITH STENT PLACEMENT;  Surgeon: Lucas Mallow, MD;  Location: [facility];  Service: Urology;  Laterality: Left;  . KIDNEY SURGERY    . MOHS SURGERY      SOCIAL HISTORY: Social History   Socioeconomic History  . Marital status: Widowed    Spouse name: Not on file  . Number of children: Not on file  . Years of education: Not on file  . Highest education level: Not on file  Occupational History  . Not on file  Tobacco Use  . Smoking status: Never Smoker  . Smokeless tobacco: Never Used  Vaping Use  . Vaping Use: Never used  Substance and Sexual Activity  . Alcohol use: Not Currently    Comment: Rare social drinking  . Drug use: Never  . Sexual activity: Not Currently  Other Topics Concern  . Not on file  Social History Narrative  . Not on file   Social Determinants of Health   Financial Resource Strain: Low Risk   . Difficulty of Paying Living Expenses: Not hard at all  Food Insecurity: No Food Insecurity  . Worried  About Running Out of Food in the Last Year: Never true  . Ran Out of Food in the Last Year: Never true  Transportation Needs: No Transportation Needs  . Lack of Transportation (Medical): No  . Lack of Transportation (Non-Medical): No  Physical Activity: Inactive  . Days of Exercise per Week: 0 days  . Minutes of Exercise per Session: 0 min  Stress: No Stress Concern Present  . Feeling of Stress : Not at all  Social Connections: Moderately Isolated  . Frequency of Communication with Friends and Family: More than three times a week  . Frequency of Social  Gatherings with Friends and Family: Once a week  . Attends Religious Services: 1 to 4 times per year  . Active Member of Clubs or Organizations: No  . Attends Archivist Meetings: Never  . Marital Status: Widowed  Intimate Partner Violence: Not At Risk  . Fear of Current or Ex-Partner: No  . Emotionally Abused: No  . Physically Abused: No  . Sexually Abused: No    FAMILY HISTORY: Family History  Problem Relation Age of Onset  . Colon cancer Mother   . Heart attack Father     ALLERGIES:  is allergic to amoxicillin, augmentin [amoxicillin-pot clavulanate], drug [tape], sulfa antibiotics, atenolol, and macrodantin [nitrofurantoin].  MEDICATIONS:  Current Outpatient Medications  Medication Sig Dispense Refill  . acetaminophen (TYLENOL) 325 MG tablet Take 2 tablets (650 mg total) by mouth every 6 (six) hours as needed for mild pain (or Fever >/= 101).    . busPIRone (BUSPAR) 15 MG tablet Take 1 tablet (15 mg total) by mouth 2 (two) times daily. 180 tablet 1  . cefdinir (OMNICEF) 300 MG capsule TAKE 2 CAPSULES (600 MG TOTAL) BY MOUTH DAILY FOR 1 DAY. (Patient not taking: Reported on 05/25/2020) 2 capsule 0  . ezetimibe (ZETIA) 10 MG tablet Take 10 mg by mouth daily.    . fluticasone (FLONASE) 50 MCG/ACT nasal spray Place 2 sprays into both nostrils daily. 16 g 6  . lansoprazole (PREVACID) 30 MG capsule Take 30 mg by mouth daily at 12 noon.    Marland Kitchen linezolid (ZYVOX) 600 MG tablet Take 1 tablet (600 mg total) by mouth every 12 (twelve) hours. 14 tablet 0  . loratadine (CLARITIN) 10 MG tablet Take 10 mg by mouth daily as needed for allergies.    . metoprolol-hydrochlorothiazide (LOPRESSOR HCT) 100-25 MG tablet Take 0.5 tablets by mouth 2 (two) times daily. (Patient not taking: Reported on 05/28/2020)    . Multiple Vitamins-Minerals (EQ MULTIVITAMINS ADULT GUMMY) CHEW Chew 1 tablet by mouth in the morning.    . potassium chloride (KLOR-CON) 10 MEQ tablet Take 10 mEq by mouth daily.    .  Probiotic Product (CULTURELLE PROBIOTICS PO) Take by mouth.    . venlafaxine XR (EFFEXOR-XR) 150 MG 24 hr capsule Take 150 mg by mouth daily with breakfast.     No current facility-administered medications for this visit.    REVIEW OF SYSTEMS:   Constitutional: ( - ) fevers, ( - )  chills , ( - ) night sweats Eyes: ( - ) blurriness of vision, ( - ) double vision, ( - ) watery eyes Ears, nose, mouth, throat, and face: ( - ) mucositis, ( - ) sore throat Respiratory: ( - ) cough, ( - ) dyspnea, ( - ) wheezes Cardiovascular: ( - ) palpitation, ( - ) chest discomfort, ( - ) lower extremity swelling Gastrointestinal:  ( - ) nausea, ( - )  heartburn, ( - ) change in bowel habits Skin: ( - ) abnormal skin rashes Lymphatics: ( - ) new lymphadenopathy, ( - ) easy bruising Neurological: ( - ) numbness, ( - ) tingling, ( - ) new weaknesses Behavioral/Psych: ( - ) mood change, ( - ) new changes  All other systems were reviewed with the patient and are negative.  PHYSICAL EXAMINATION: ECOG PERFORMANCE STATUS: 3 - Symptomatic, >50% confined to bed  Vitals:   05/28/20 1606  BP: 123/72  Pulse: 90  Resp: 18  Temp: (!) 97.1 F (36.2 C)  SpO2: 100%   Filed Weights   05/28/20 1606  Weight: 161 lb 9.6 oz (73.3 kg)    GENERAL: chronically ill appearing elderly Caucasian female in NAD  SKIN: skin color, texture, turgor are normal, no rashes or significant lesions EYES: conjunctiva are pink and non-injected, sclera clear LUNGS: clear to auscultation and percussion with normal breathing effort HEART: regular rate & rhythm and no murmurs and no lower extremity edema ABDOMEN: soft, non-tender, non-distended, normal bowel sounds Musculoskeletal: no cyanosis of digits and no clubbing  PSYCH: alert & oriented x 3, fluent speech NEURO: no focal motor/sensory deficits  LABORATORY DATA:  I have reviewed the data as listed CBC Latest Ref Rng & Units 05/25/2020 05/20/2020 05/19/2020  WBC 4.0 - 10.5 K/uL 37.7  Repeated and verified X2.([place]) 29.9(H) 31.4(H)  Hemoglobin 12.0 - 15.0 g/dL 9.2(L) 7.9(L) 8.4(L)  Hematocrit 36.0 - 46.0 % 28.5(L) 26.7(L) 27.9(L)  Platelets 150.0 - 400.0 K/uL 247.0 212 211    CMP Latest Ref Rng & Units 05/25/2020 05/20/2020 05/19/2020  Glucose 70 - 99 mg/dL 128(H) 93 103(H)  BUN 6 - 23 mg/dL 14 12 15   Creatinine 0.40 - 1.20 mg/dL 1.10 0.73 0.77  Sodium 135 - 145 mEq/L 141 140 139  Potassium 3.5 - 5.1 mEq/L 4.1 3.3(L) 3.9  Chloride 96 - 112 mEq/L 104 109 107  CO2 19 - 32 mEq/L 22 21(L) 22  Calcium 8.4 - 10.5 mg/dL 9.0 7.9(L) 8.3(L)  Total Protein 6.0 - 8.3 g/dL 5.3(L) - -  Total Bilirubin 0.2 - 1.2 mg/dL 0.4 - -  Alkaline Phos 39 - 117 U/L 102 - -  AST 0 - 37 U/L 15 - -  ALT 0 - 35 U/L 12 - -    RADIOGRAPHIC STUDIES: CT ABDOMEN PELVIS WO CONTRAST  Result Date: 05/08/2020 CLINICAL DATA:  80 year old female with pyelonephritis. EXAM: CT ABDOMEN AND PELVIS WITHOUT CONTRAST TECHNIQUE: Multidetector CT imaging of the abdomen and pelvis was performed following the standard protocol without IV contrast. COMPARISON:  Renal ultrasound yesterday. FINDINGS: Lower chest: Mild cardiomegaly. Tortuous descending thoracic aorta. No pericardial effusion. Minimal lung base atelectasis. Hepatobiliary: Mildly nodular liver contour. Circumscribed round low-density area measuring 12 mm on series 3, image 21 has simple fluid density and is likely a benign cyst. No other discrete liver lesion on this noncontrast exam. Diminutive or absent gallbladder. Pancreas: Negative noncontrast pancreas. Spleen: Splenomegaly. Estimated splenic volume 535 mL (normal splenic volume range 83 - 412 mL). No discrete splenic lesion. Adrenals/Urinary Tract: Negative adrenal glands. Right kidney appears nonobstructed although there is a round 9 mm calculus in the right renal pelvis (series 6, image 82). Right ureter is decompressed. The distal ureter is difficult to delineate but appears to remain decompressed to the to  the bladder. There is moderate to severe left hydronephrosis. But the left ureteropelvic junction appears abruptly tapered on coronal image 79. Only a punctate left intrarenal calculus is identified.  No left hydroureter is evident. The left UVJ appears normal. Diminutive urinary bladder, with mild bladder wall thickening in the region of sigmoid colon inflammatory stranding. See below. Numerous pelvic phleboliths. Stomach/Bowel: Retained stool in the rectum with no rectal wall thickening or inflammation identified. However, the upstream sigmoid colon is thickened and with mild mesenteric stranding as seen on coronal image 68 which extends to the nearby bladder dome. There are diverticula in the region. This inflammation is in a segment of fiber 6 cm. Diverticulosis in the upstream descending colon without active inflammation. Mild retained stool proximal to the sigmoid. Diminutive or absent appendix. Terminal ileum also remarkable for fairly extensive diverticulosis which tracks into the upstream distal small bowel. No dilated small bowel. No free air or free fluid. No other convincing acute bowel inflammation. Stomach and duodenum are decompressed. Vascular/Lymphatic: Aortoiliac calcified atherosclerosis. Tortuous abdominal aorta. Vascular patency is not evaluated in the absence of IV contrast. Additionally, there is nonspecific appearing lymphadenopathy in the small bowel mesentery (coronal images 54-59) with individual lymph nodes up to 13 mm short axis. Associated haziness of the root of the mesentery. Furthermore, there is bulky left side retroperitoneal lymphadenopathy, with individual pararenal nodes up to 23 mm short axis. The lymphadenopathy abates at the pelvic inlet. No inguinal lymphadenopathy. Reproductive: Absent uterus.  Diminutive or absent ovaries. Other: Mild presacral stranding is nonspecific. No pelvic free fluid. Musculoskeletal: Several chronic left posterior rib fractures. Grade 1 degenerative  lower lumbar spondylolisthesis with severe facet degeneration. Severe chronic disc degeneration at the lumbosacral junction. No acute osseous abnormality identified. IMPRESSION: 1. Moderate to severe retroperitoneal and mesenteric lymphadenopathy suspicious for Lymphoma, Leukemia, or less likely other lymphoproliferative disorder. A CT guided left pararenal lymph node needle biopsy should be feasible. 2. Sigmoid colon appears inflamed compatible with acute diverticulitis or colitis. Mild secondary inflammation of the nearby Bladder Dome. No evidence of colovesical fistula or abscess. Underlying extensive small bowel diverticulosis. But no other bowel inflammation identified on this noncontrast exam. 3. Moderate to severe left hydronephrosis but with abrupt tapering at the left ureteropelvic junction suggesting UPJ stenosis. Right greater than left nephrolithiasis but no obstructing calculus is identified. No strong evidence of pyelonephritis, although IV contrast would be necessary for that CT diagnosis. 4. Splenomegaly, which may be secondary to #1 rather than due to chronic liver disease. 5. Aortic Atherosclerosis (ICD10-I70.0). Electronically Signed   By: Genevie Ann M.D.   On: 05/08/2020 09:16   NM Renal Imaging Flow W/Pharm  Result Date: 05/10/2020 CLINICAL DATA:  Evaluate hydronephrosis of the left kidney. Status post bilobed plasty. EXAM: NUCLEAR MEDICINE RENAL SCAN WITH DIURETIC ADMINISTRATION TECHNIQUE: Radionuclide angiographic and sequential renal images were obtained after intravenous injection of radiopharmaceutical. Imaging was continued during slow intravenous injection of Lasix approximately 15 minutes after the start of the examination. RADIOPHARMACEUTICALS:  5.5 mCi Technetium-35mMAG3 IV COMPARISON:  05/08/2020 FINDINGS: Flow: Normal flow to the right kidney. Markedly diminished in delayed perfusion to the left kidney. Left renogram: Asymmetric delayed cortical uptake, excretion, and clearance of  the radiopharmaceutical. On the postvoid images there is persistent radiopharmaceutical accumulation within the dilated left renal collecting system. Right renogram: Normal cortical uptake, excretion in clearance of the radiopharmaceutical. Differential: Left kidney = 23 % Right kidney = 77 % T1/2 post Lasix : Technical difficulties with Lasix administration resulted in the diuretic being administered at 80 minutes with only 10 minutes remaining. IMPRESSION: 1. Normally functioning right kidney. 2. Delayed left renal perfusion, cortical uptake, excretion and clearance  of the radiopharmaceutical consistent with obstructive uropathy. Electronically Signed   By: Kerby Moors M.D.   On: 05/10/2020 14:35   DG Retrograde Pyelogram  Result Date: 05/12/2020 CLINICAL DATA:  80 year old female with a history of left-sided hydronephrosis with history of prior pyloroplasty EXAM: RETROGRADE PYELOGRAM COMPARISON:  CT 05/08/2020 FINDINGS: Limited fluoroscopic images during retrograde pyelogram. Images demonstrate partial opacification of the left-sided renal pelvis and collecting system with pelvicaliectasis and hydronephrosis. The visualized ureter is of relatively normal caliber. Final image demonstrates placement of a double-J ureteral stent IMPRESSION: Limited fluoroscopic spot images during retrograde pyelogram demonstrates treatment of the left collecting system with placement of a double-J stent. Please refer to the dictated operative report for full details of intraoperative findings and procedure. Electronically Signed   By: Corrie Mckusick D.O.   On: 05/12/2020 13:38   US RENAL  Result Date: 05/17/2020 CLINICAL DATA:  UTI, recent left ureteral stent EXAM: RENAL / URINARY TRACT ULTRASOUND COMPLETE COMPARISON:  Renal ultrasound May 07, 2020 FINDINGS: Right Kidney: Renal measurements: 10.7 x 5.2 x 4.1 cm = volume: 137 mL. Echogenicity within normal limits. Nonobstructive 1 cm renal calculus. No mass or  hydronephrosis visualized. Left Kidney: Renal measurements: 10.5 x 5.7 x 5.3 cm = volume: 166 mL. Echogenicity within normal limits. No mass or hydronephrosis visualized. Bladder: Appears normal for degree of bladder distention. Curvilinear echogenic focus in the bladder likely representing a stent. Other: None. IMPRESSION: 1. No hydronephrosis Electronically Signed   By: Dahlia Bailiff MD   On: 05/17/2020 20:53   US RENAL  Result Date: 05/07/2020 CLINICAL DATA:  Pyelonephritis. EXAM: RENAL / URINARY TRACT ULTRASOUND COMPLETE COMPARISON:  None. FINDINGS: Right Kidney: Renal measurements: 11.4 x 5.1 x 5.1 cm = volume: 154.6 mL. Echogenicity within normal limits. No mass or hydronephrosis visualized. Left Kidney: Renal measurements: 10.8 x 5.3 x 6.6 cm = volume: 198.8 mL. Moderate hydronephrosis identified on the left. Bladder: Appears normal for degree of bladder distention. Other: The spleen is enlarged measuring 13.8 x 12.7 x 5.8 cm with a volume of 537 cc. IMPRESSION: 1. Moderate left hydronephrosis of uncertain chronicity. Acute/active obstruction not excluded on this study. Recommend clinical correlation. CT imaging could further evaluate for an underlying cause. 2. Splenomegaly. Electronically Signed   By: Dorise Bullion III M.D   On: 05/07/2020 16:55   DG Chest Port 1 View  Result Date: 05/17/2020 CLINICAL DATA:  Fever EXAM: PORTABLE CHEST 1 VIEW COMPARISON:  May 06, 2020 FINDINGS: There is no edema or airspace opacity. Heart size and pulmonary vascularity are normal. No adenopathy. There are several healed rib fractures on the left. There is postoperative change in the right humerus. IMPRESSION: No edema or airspace opacity.  Stable cardiac silhouette. Electronically Signed   By: Lowella Grip III M.D.   On: 05/17/2020 15:08   DG Chest Portable 1 View  Result Date: 05/06/2020 CLINICAL DATA:  Fever, chills and cough. EXAM: PORTABLE CHEST 1 VIEW COMPARISON:  None. FINDINGS: The lungs are  hyperinflated. There is no evidence of acute infiltrate, pleural effusion or pneumothorax. The heart size and mediastinal contours are within normal limits. Multiple chronic left-sided rib fractures are seen. A radiopaque fusion plate and screws are seen within the proximal right humerus. IMPRESSION: No active cardiopulmonary disease. Electronically Signed   By: Virgina Norfolk M.D.   On: 05/06/2020 15:41    ASSESSMENT & PLAN Sarah [name] 80 y.o. female with medical history significant for CLL and breast cancer in remission who presents  for a follow up visit.  After review of the labs, the records, and discussion with the patient the findings are most consistent with a diagnosis of CLL that does not currently require treatment.  She has no palpable lymphadenopathy and no evidence of anemia and thrombocytopenia.  As such I do believe would be appropriate to continue observation at this time.  She has been attempted on therapy as before with ibrutinib and rituximab bendamustine but is not been able to tolerate these treatments.  As such I do believe that observation alone with discussion of comfort based care if the patient were to have progression.  We will plan to see the patient back in approximately 3 months time in order to reassess.  #Chronic Lymphocytic Leukemia --based on prior labs and studies the patient has a confirmed diagnosis of CLL.  --will order CBC, CMP, LDH today --patient has attempted prior therapy with ibrutinib and R-Benda but not been able to tolerate thearpy.  --CT scan from 05/08/20 shows lymphadenopathy and splenomegaly. Patient does not have stably low anemia/thrombocytopenia on labs (appears to have had a transient recovering anemia while in house with UTI).  --RTC in 3 months or sooner if treatment is indicated/more evaluation is required.   #Healthcare Maintenance --patient in need of a PCP, will refer to [place]  #Breast Cancer. Stage IIA left breast  ER+/PR+/HER2- IDCA --patient has completed AI therapy. --continue yearly mammograms   No orders of the defined types were placed in this encounter.  All questions were answered. The patient knows to call the clinic with any problems, questions or concerns.  A total of more than 30 minutes were spent on this encounter and over half of that time was spent on counseling and coordination of care as outlined above.   Ledell Peoples, MD Department of Hematology/Oncology [place] [facility] Phone: [phone] Pager: [phone] Email: [email or web address]  06/01/2020 12:01 PM

## 2020-06-04 NOTE — Progress Notes (Signed)
   05/07/20 0802  Assess: MEWS Score  Temp 100.1 F (37.8 C)  BP 115/69  Pulse Rate 97  ECG Heart Rate 95  Resp (!) 28  SpO2 98 %  Assess: MEWS Score  MEWS Temp 0  MEWS Systolic 0  MEWS Pulse 0  MEWS RR 2  MEWS LOC 0  MEWS Score 2  MEWS Score Color Yellow  Assess: if the MEWS score is Yellow or Red  Were vital signs taken at a resting state? Yes  Focused Assessment No change from prior assessment  Early Detection of Sepsis Score *See Row Information* Low  MEWS guidelines implemented *See Row Information* No, previously yellow, continue vital signs every 4 hours  Treat  Pain Scale 0-10  Pain Score 0  Take Vital Signs  Increase Vital Sign Frequency  Yellow: Q 2hr X 2 then Q 4hr X 2, if remains yellow, continue Q 4hrs  Escalate  MEWS: Escalate Yellow: discuss with charge nurse/RN and consider discussing with provider and RRT  Notify: Charge Nurse/RN  Name of Charge Nurse/RN Notified Rama  Date Charge Nurse/RN Notified 05/07/20  Time Charge Nurse/RN Notified 0805  Document  Progress note created (see row info) Yes

## 2020-06-07 DIAGNOSIS — E785 Hyperlipidemia, unspecified: Secondary | ICD-10-CM | POA: Insufficient documentation

## 2020-06-07 DIAGNOSIS — M109 Gout, unspecified: Secondary | ICD-10-CM | POA: Insufficient documentation

## 2020-06-07 DIAGNOSIS — K219 Gastro-esophageal reflux disease without esophagitis: Secondary | ICD-10-CM | POA: Insufficient documentation

## 2020-06-07 DIAGNOSIS — T7840XA Allergy, unspecified, initial encounter: Secondary | ICD-10-CM | POA: Insufficient documentation

## 2020-06-07 DIAGNOSIS — I10 Essential (primary) hypertension: Secondary | ICD-10-CM | POA: Insufficient documentation

## 2020-06-09 ENCOUNTER — Other Ambulatory Visit: Payer: Self-pay

## 2020-06-09 ENCOUNTER — Ambulatory Visit (INDEPENDENT_AMBULATORY_CARE_PROVIDER_SITE_OTHER): Payer: Medicare Other | Admitting: Cardiology

## 2020-06-09 ENCOUNTER — Encounter: Payer: Self-pay | Admitting: Cardiology

## 2020-06-09 ENCOUNTER — Ambulatory Visit (INDEPENDENT_AMBULATORY_CARE_PROVIDER_SITE_OTHER): Payer: Medicare Other

## 2020-06-09 VITALS — BP 108/64 | HR 90 | Ht 63.0 in | Wt 158.0 lb

## 2020-06-09 DIAGNOSIS — I959 Hypotension, unspecified: Secondary | ICD-10-CM

## 2020-06-09 DIAGNOSIS — I252 Old myocardial infarction: Secondary | ICD-10-CM

## 2020-06-09 DIAGNOSIS — R0600 Dyspnea, unspecified: Secondary | ICD-10-CM

## 2020-06-09 DIAGNOSIS — I2510 Atherosclerotic heart disease of native coronary artery without angina pectoris: Secondary | ICD-10-CM

## 2020-06-09 DIAGNOSIS — R0609 Other forms of dyspnea: Secondary | ICD-10-CM

## 2020-06-09 DIAGNOSIS — E7800 Pure hypercholesterolemia, unspecified: Secondary | ICD-10-CM

## 2020-06-09 DIAGNOSIS — C9110 Chronic lymphocytic leukemia of B-cell type not having achieved remission: Secondary | ICD-10-CM

## 2020-06-09 DIAGNOSIS — R002 Palpitations: Secondary | ICD-10-CM | POA: Diagnosis not present

## 2020-06-09 HISTORY — DX: Atherosclerotic heart disease of native coronary artery without angina pectoris: I25.10

## 2020-06-09 NOTE — Progress Notes (Signed)
Cardiology Consultation:    Date:  06/09/2020   ID:  [name], DOB August 22, 1940, MRN [id number]  PCP:  Libby Maw, MD  Cardiologist:  Jenne Campus, MD   Referring MD: Libby Maw,*   Chief Complaint  Patient presents with  . Low BP  . Fatigue  . Shortness of Breath    History of Present Illness:    Sarah [name] is a 80 y.o. female who is being seen today for the evaluation of low blood pressure at the request of Libby Maw,*.  Recently she relocated from she will note: Not to be with her family.  She simply was not able to take care of herself.  She does have past medical history significant for coronary artery disease.  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  There is no chest pain tightness squeezing pressure burning chest.  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  What is troubling is the fact that she does have hypotension and she required discontinuation of many medication for hypertension that she been taking for years. She does not exercise on the regular basis She does not smoke never did Does not have family history of premature coronary disease She is taking activity of daily living with some difficulties gradually getting better now after recurrent urinary tract infections.  Past Medical History:  Diagnosis Date  . Acute cystitis with hematuria 12/15/2019  . AKI (acute kidney injury) ([facility]) 05/06/2020  . Allergic rhinitis due to pollen 12/08/2019  . Allergy   . CLL (chronic lymphocytic leukemia) ([place])   . Elevated cholesterol 12/08/2019  . GERD (gastroesophageal reflux disease)   . Gout   .  History of gout 12/08/2019  . History of heart attack   . History of MI (myocardial infarction) 12/08/2019  . HLD (hyperlipidemia)   . Hospital discharge follow-up 05/25/2020  . Hypertension   . Hypokalemia 05/25/2020  . Hypotension 05/06/2020  . 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 Non Hodgkin's lymphoma ([place])   . Urinary frequency 12/08/2019  . UTI (urinary tract infection) 05/06/2020    Past Surgical History:  Procedure Laterality Date  . ABDOMINAL HYSTERECTOMY    . APPENDECTOMY    . CHOLECYSTECTOMY    . CYSTOSCOPY WITH STENT PLACEMENT Left 05/12/2020   Procedure: CYSTOSCOPY WITH STENT PLACEMENT;  Surgeon: Lucas Mallow, MD;  Location: [facility];  Service: Urology;  Laterality: Left;  . Double masectomy    . KIDNEY SURGERY    . MOHS SURGERY    . TONSILECTOMY, ADENOIDECTOMY, BILATERAL MYRINGOTOMY AND TUBES      Current Medications: Current Meds  Medication Sig  . acetaminophen (TYLENOL) 325 MG tablet Take 2 tablets (650 mg total) by mouth every 6 (six) hours as needed for mild pain (or Fever >/= 101).  . busPIRone (BUSPAR) 15 MG tablet Take 1 tablet (15 mg total) by mouth 2 (two) times daily.  Marland Kitchen CRANBERRY EXTRACT PO Take 1 tablet by mouth daily. Plan to start soon  . ezetimibe (ZETIA) 10 MG tablet Take 10 mg by mouth daily.  . fluticasone (FLONASE) 50 MCG/ACT nasal spray Place 2 sprays into both nostrils daily.  Marland Kitchen guaifenesin (HUMIBID E) 400 MG TABS tablet  Take 400 mg by mouth every 6 (six) hours as needed (allergies/ mucus).  . lansoprazole (PREVACID) 30 MG capsule Take 30 mg by mouth daily.  Marland Kitchen loratadine (CLARITIN) 10 MG tablet Take 10 mg by mouth daily as needed for allergies.  . Multiple Vitamins-Minerals (EQ MULTIVITAMINS ADULT GUMMY) CHEW Chew 1 tablet by mouth in the morning.  . potassium chloride (KLOR-CON) 10 MEQ tablet Take 10 mEq by mouth daily.  Marland Kitchen saccharomyces boulardii (FLORASTOR) 250 MG capsule Take 30 mg by mouth daily.  Marland Kitchen venlafaxine XR (EFFEXOR-XR) 150 MG 24 hr  capsule Take 150 mg by mouth daily with breakfast.     Allergies:   Amoxicillin, Augmentin [amoxicillin-pot clavulanate], Drug [tape], Sulfa antibiotics, Atenolol, and Macrodantin [nitrofurantoin]   Social History   Socioeconomic History  . Marital status: Widowed    Spouse name: Not on file  . Number of children: Not on file  . Years of education: Not on file  . Highest education level: Not on file  Occupational History  . Not on file  Tobacco Use  . Smoking status: Never Smoker  . Smokeless tobacco: Never Used  Vaping Use  . Vaping Use: Never used  Substance and Sexual Activity  . Alcohol use: Not Currently    Comment: Rare social drinking  . Drug use: Never  . Sexual activity: Not Currently  Other Topics Concern  . Not on file  Social History Narrative  . Not on file   Social Determinants of Health   Financial Resource Strain: Low Risk   . Difficulty of Paying Living Expenses: Not hard at all  Food Insecurity: No Food Insecurity  . Worried About Charity fundraiser in the Last Year: Never true  . Ran Out of Food in the Last Year: Never true  Transportation Needs: No Transportation Needs  . Lack of Transportation (Medical): No  . Lack of Transportation (Non-Medical): No  Physical Activity: Inactive  . Days of Exercise per Week: 0 days  . Minutes of Exercise per Session: 0 min  Stress: No Stress Concern Present  . Feeling of Stress : Not at all  Social Connections: Moderately Isolated  . Frequency of Communication with Friends and Family: More than three times a week  . Frequency of Social Gatherings with Friends and Family: Once a week  . Attends Religious Services: 1 to 4 times per year  . Active Member of Clubs or Organizations: No  . Attends Archivist Meetings: Never  . Marital Status: Widowed     Family History: The patient's family history includes Colon cancer in her mother; Heart attack in her father. ROS:   Please see the history of  present illness.    All 14 point review of systems negative except as described per history of present illness.  EKGs/Labs/Other Studies Reviewed:    The following studies were reviewed today:   EKG:  EKG is  ordered today.  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  Recent Labs: 05/14/2020: B Natriuretic Peptide 100.4 05/20/2020: Magnesium 1.8 05/25/2020: ALT 12; BUN 14; Creatinine, Ser 1.10; Hemoglobin 9.2; Platelets 247.0; Potassium 4.1; Sodium 141  Recent Lipid Panel No results found for: CHOL, TRIG, HDL, CHOLHDL, VLDL, LDLCALC, LDLDIRECT  Physical Exam:    VS:  BP 108/64 (BP Location: Right Arm, Patient Position: Sitting)   Pulse 90   Ht 5\' 3"  (1.6  m)   Wt 158 lb (71.7 kg)   SpO2 90%   BMI 27.99 kg/m     Wt Readings from Last 3 Encounters:  06/09/20 158 lb (71.7 kg)  05/28/20 161 lb 9.6 oz (73.3 kg)  05/25/20 161 lb 3.2 oz (73.1 kg)     GEN:  Well nourished, well developed in no acute distress HEENT: Normal NECK: No JVD; No carotid bruits LYMPHATICS: No lymphadenopathy CARDIAC: RRR, no murmurs, no rubs, no gallops RESPIRATORY:  Clear to auscultation without rales, wheezing or rhonchi  ABDOMEN: Soft, non-tender, non-distended MUSCULOSKELETAL:  No edema; No deformity  SKIN: Warm and dry NEUROLOGIC:  Alert and oriented x 3 PSYCHIATRIC:  Normal affect   ASSESSMENT:    1. Palpitations   2. Dyspnea on exertion   3. Coronary artery disease involving native coronary artery of native heart without angina pectoris   4. History of myocardial infarction   5. CLL (chronic lymphocytic leukemia) ([facility])   6. Elevated cholesterol   7. Hypotension, unspecified hypotension type    PLAN:    In order of problems listed above:  1. Palpitations I will ask her to wear monitor to see exactly what kind of arrhythmia with dealing with.  Her  daughter tells me she was told to have PVCs and she was given metoprolol for it however lately because of hypotension that medication has been withdrawn she also tells me that there was situation that she does have what sounds like ventricular bigeminy with an effective pulse rate being low again we will do a Zio patch to clarify that. 2. History of coronary artery disease status post myocardial infarction 1999.  Will get echocardiogram to assess left ventricle ejection fraction.  That will also help to determine what is the reason for her hypotension is and then rule out this to better treat hypertension may be even with increased salt intake but I will not do it until I get her echocardiogram. 3. History of CLL that being followed by internal medicine team. 4. Dyslipidemia I will not change any of her medication right now I will have better understanding about her heart and coronary artery disease history.   Medication Adjustments/Labs and Tests Ordered: Current medicines are reviewed at length with the patient today.  Concerns regarding medicines are outlined above.  Orders Placed This Encounter  Procedures  . TSH  . LONG TERM MONITOR (3-14 DAYS)  . EKG 12-Lead  . ECHOCARDIOGRAM COMPLETE   No orders of the defined types were placed in this encounter.   Signed, Park Liter, MD, [facility]. 06/09/2020 12:27 PM    [place]

## 2020-06-09 NOTE — Patient Instructions (Signed)
Medication Instructions:  Your physician recommends that you continue on your current medications as directed. Please refer to the Current Medication list given to you today.  *If you need a refill on your cardiac medications before your next appointment, please call your pharmacy*   Lab Work: Your physician recommends that you return for lab work today: tsh   If you have labs (blood work) drawn today and your tests are completely normal, you will receive your results only by: Marland Kitchen MyChart Message (if you have MyChart) OR . A paper copy in the mail If you have any lab test that is abnormal or we need to change your treatment, we will call you to review the results.   Testing/Procedures: A zio monitor was ordered today. It will remain on for 7 days. You will then return monitor and event diary in provided box. It takes 1-2 weeks for report to be downloaded and returned to [place]. We will call you with the results. If monitor falls off or has orange flashing light, please call Zio for further instructions.    Your physician has requested that you have an echocardiogram. Echocardiography is a painless test that uses sound waves to create images of your heart. It provides your doctor with information about the size and shape of your heart and how well your heart's chambers and valves are working. This procedure takes approximately one hour. There are no restrictions for this procedure.    Follow-Up: At [facility], you and your health needs are our priority.  As part of our continuing mission to provide you with exceptional heart care, we have created designated Provider Care Teams.  These Care Teams include your primary Cardiologist (physician) and Advanced Practice Providers (APPs -  Physician Assistants and Nurse Practitioners) who all work together to provide you with the care you need, when you need it.  We recommend signing up for the patient portal called "MyChart".  Sign up information is  provided on this After Visit Summary.  MyChart is used to connect with patients for Virtual Visits (Telemedicine).  Patients are able to view lab/test results, encounter notes, upcoming appointments, etc.  Non-urgent messages can be sent to your provider as well.   To learn more about what you can do with MyChart, go to [email or web address].    Your next appointment:   6 week(s)  The format for your next appointment:   In Person  Provider:   Jenne Campus, MD   Other Instructions   Echocardiogram An echocardiogram is a test that uses sound waves (ultrasound) to produce images of the heart. Images from an echocardiogram can provide important information about:  Heart size and shape.  The size and thickness and movement of your heart's walls.  Heart muscle function and strength.  Heart valve function or if you have stenosis. Stenosis is when the heart valves are too narrow.  If blood is flowing backward through the heart valves (regurgitation).  A tumor or infectious growth around the heart valves.  Areas of heart muscle that are not working well because of poor blood flow or injury from a heart attack.  Aneurysm detection. An aneurysm is a weak or damaged part of an artery wall. The wall bulges out from the normal force of blood pumping through the body. Tell a health care provider about:  Any allergies you have.  All medicines you are taking, including vitamins, herbs, eye drops, creams, and over-the-counter medicines.  Any blood disorders you have.  Any surgeries  you have had.  Any medical conditions you have.  Whether you are pregnant or may be pregnant. What are the risks? Generally, this is a safe test. However, problems may occur, including an allergic reaction to dye (contrast) that may be used during the test. What happens before the test? No specific preparation is needed. You may eat and drink normally. What happens during the test?  You will take  off your clothes from the waist up and put on a hospital gown.  Electrodes or electrocardiogram (ECG)patches may be placed on your chest. The electrodes or patches are then connected to a device that monitors your heart rate and rhythm.  You will lie down on a table for an ultrasound exam. A gel will be applied to your chest to help sound waves pass through your skin.  A handheld device, called a transducer, will be pressed against your chest and moved over your heart. The transducer produces sound waves that travel to your heart and bounce back (or "echo" back) to the transducer. These sound waves will be captured in real-time and changed into images of your heart that can be viewed on a video monitor. The images will be recorded on a computer and reviewed by your health care provider.  You may be asked to change positions or hold your breath for a short time. This makes it easier to get different views or better views of your heart.  In some cases, you may receive contrast through an IV in one of your veins. This can improve the quality of the pictures from your heart. The procedure may vary among health care providers and hospitals.   What can I expect after the test? You may return to your normal, everyday life, including diet, activities, and medicines, unless your health care provider tells you not to do that. Follow these instructions at home:  It is up to you to get the results of your test. Ask your health care provider, or the department that is doing the test, when your results will be ready.  Keep all follow-up visits. This is important. Summary  An echocardiogram is a test that uses sound waves (ultrasound) to produce images of the heart.  Images from an echocardiogram can provide important information about the size and shape of your heart, heart muscle function, heart valve function, and other possible heart problems.  You do not need to do anything to prepare before this  test. You may eat and drink normally.  After the echocardiogram is completed, you may return to your normal, everyday life, unless your health care provider tells you not to do that. This information is not intended to replace advice given to you by your health care provider. Make sure you discuss any questions you have with your health care provider. Document Revised: 09/30/2019 Document Reviewed: 09/30/2019 Elsevier Patient Education  2021 [employer].

## 2020-06-10 ENCOUNTER — Ambulatory Visit (INDEPENDENT_AMBULATORY_CARE_PROVIDER_SITE_OTHER): Payer: Medicare Other | Admitting: Family Medicine

## 2020-06-10 ENCOUNTER — Encounter: Payer: Self-pay | Admitting: Family Medicine

## 2020-06-10 VITALS — BP 118/72 | HR 79 | Temp 97.1°F | Ht 63.0 in | Wt 157.8 lb

## 2020-06-10 DIAGNOSIS — D6489 Other specified anemias: Secondary | ICD-10-CM

## 2020-06-10 DIAGNOSIS — R6889 Other general symptoms and signs: Secondary | ICD-10-CM

## 2020-06-10 DIAGNOSIS — R7989 Other specified abnormal findings of blood chemistry: Secondary | ICD-10-CM | POA: Insufficient documentation

## 2020-06-10 DIAGNOSIS — R5383 Other fatigue: Secondary | ICD-10-CM | POA: Insufficient documentation

## 2020-06-10 DIAGNOSIS — N390 Urinary tract infection, site not specified: Secondary | ICD-10-CM

## 2020-06-10 DIAGNOSIS — I959 Hypotension, unspecified: Secondary | ICD-10-CM

## 2020-06-10 DIAGNOSIS — E039 Hypothyroidism, unspecified: Secondary | ICD-10-CM

## 2020-06-10 HISTORY — DX: Other fatigue: R53.83

## 2020-06-10 HISTORY — DX: Other general symptoms and signs: R68.89

## 2020-06-10 HISTORY — DX: Other specified abnormal findings of blood chemistry: R79.89

## 2020-06-10 LAB — URINALYSIS, ROUTINE W REFLEX MICROSCOPIC
Bilirubin Urine: NEGATIVE
Ketones, ur: NEGATIVE
Nitrite: NEGATIVE
Specific Gravity, Urine: 1.01 (ref 1.000–1.030)
Total Protein, Urine: 30 — AB
Urine Glucose: NEGATIVE
Urobilinogen, UA: 0.2 (ref 0.0–1.0)
pH: 5.5 (ref 5.0–8.0)

## 2020-06-10 LAB — POCT URINALYSIS DIPSTICK
Bilirubin, UA: NEGATIVE
Glucose, UA: NEGATIVE
Ketones, UA: NEGATIVE
Nitrite, UA: NEGATIVE
Protein, UA: POSITIVE — AB
Spec Grav, UA: 1.02 (ref 1.010–1.025)
Urobilinogen, UA: NEGATIVE E.U./dL — AB
pH, UA: 6 (ref 5.0–8.0)

## 2020-06-10 LAB — TSH
TSH: 8.27 u[IU]/mL — ABNORMAL HIGH (ref 0.450–4.500)
TSH: 5.98 u[IU]/mL — ABNORMAL HIGH (ref 0.35–4.50)

## 2020-06-10 LAB — B12 AND FOLATE PANEL
Folate: >23.6 ng/mL (ref >5.9)
Vitamin B-12: 257 pg/mL (ref 211–911)

## 2020-06-10 NOTE — Progress Notes (Addendum)
Established Patient Office Visit  Subjective:  Patient ID: Sarah [name], female    DOB: Aug 29, 1940  Age: 80 y.o. MRN: [id number]  CC:  Chief Complaint  Patient presents with  . Follow-up    Follow up on labs and UTI, states that she has dysuria and strong odor to urine, still has incontinence. Discuss labs from yesterday.     HPI Sarah [name] presents for follow-up of hypotension.  Blood pressure has improved.  Status post follow-up with oncology and cardiology.  She continues to hold metoprolol with HCTZ.  She now has a loop recorder.  History of PVCs.  Cardiology ordered a TSH that was elevated.  Patient has no history of hypothyroidism.  She does have a sister with hypothyroidism and 2 children with it.  Patient denies cold sensitivity, regular constipation or hair loss.  1 day history of slight dysuria.  Denies fevers chills or nausea.  She has tried to drink more fluids.  Urine was clear today but had a strong odor.  History of multidrug-resistant UTIs.  She complains of fatigue yesterday but feels better today.  Past Medical History:  Diagnosis Date  . Acute cystitis with hematuria 12/15/2019  . AKI (acute kidney injury) ([place]) 05/06/2020  . Allergic rhinitis due to pollen 12/08/2019  . Allergy   . Anxiety   . Arthritis   . CLL (chronic lymphocytic leukemia) ([place])   . Dyspnea    continuos  . Elevated cholesterol 12/08/2019  . GERD (gastroesophageal reflux disease)   . Gout   . History of gout 12/08/2019  . History of heart attack   . History of MI (myocardial infarction) 12/08/2019  . HLD (hyperlipidemia)   . Hospital discharge follow-up 05/25/2020  . Hypertension   . Hypokalemia 05/25/2020  . Hypotension 05/06/2020  . 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 Non Hodgkin's lymphoma ([place])   . Urinary frequency 12/08/2019  . UTI (urinary tract infection) 05/06/2020    Past Surgical History:  Procedure Laterality Date  . ABDOMINAL HYSTERECTOMY    . APPENDECTOMY    .  CHOLECYSTECTOMY    . CYSTOSCOPY WITH STENT PLACEMENT Left 05/12/2020   Procedure: CYSTOSCOPY WITH STENT PLACEMENT;  Surgeon: Lucas Mallow, MD;  Location: [facility];  Service: Urology;  Laterality: Left;  . Double masectomy    . KIDNEY SURGERY    . MOHS SURGERY    . TONSILECTOMY, ADENOIDECTOMY, BILATERAL MYRINGOTOMY AND TUBES      Family History  Problem Relation Age of Onset  . Colon cancer Mother   . Heart attack Father     Social History   Socioeconomic History  . Marital status: Widowed    Spouse name: Not on file  . Number of children: Not on file  . Years of education: Not on file  . Highest education level: Not on file  Occupational History  . Not on file  Tobacco Use  . Smoking status: Never Smoker  . Smokeless tobacco: Never Used  Vaping Use  . Vaping Use: Never used  Substance and Sexual Activity  . Alcohol use: Not Currently    Comment: Rare social drinking  . Drug use: Never  . Sexual activity: Not Currently  Other Topics Concern  . Not on file  Social History Narrative  . Not on file   Social Determinants of Health   Financial Resource Strain: Low Risk   . Difficulty of Paying Living Expenses: Not hard at all  Food Insecurity: No Food Insecurity  . Worried  About Running Out of Food in the Last Year: Never true  . Ran Out of Food in the Last Year: Never true  Transportation Needs: No Transportation Needs  . Lack of Transportation (Medical): No  . Lack of Transportation (Non-Medical): No  Physical Activity: Inactive  . Days of Exercise per Week: 0 days  . Minutes of Exercise per Session: 0 min  Stress: No Stress Concern Present  . Feeling of Stress : Not at all  Social Connections: Moderately Isolated  . Frequency of Communication with Friends and Family: More than three times a week  . Frequency of Social Gatherings with Friends and Family: Once a week  . Attends Religious Services: 1 to 4 times per year  . Active Member of Clubs or  Organizations: No  . Attends Archivist Meetings: Never  . Marital Status: Widowed  Intimate Partner Violence: Not At Risk  . Fear of Current or Ex-Partner: No  . Emotionally Abused: No  . Physically Abused: No  . Sexually Abused: No    Outpatient Medications Prior to Visit  Medication Sig Dispense Refill  . acetaminophen (TYLENOL) 325 MG tablet Take 2 tablets (650 mg total) by mouth every 6 (six) hours as needed for mild pain (or Fever >/= 101).    . busPIRone (BUSPAR) 15 MG tablet Take 1 tablet (15 mg total) by mouth 2 (two) times daily. 180 tablet 1  . CRANBERRY EXTRACT PO Take 1 tablet by mouth daily. Plan to start soon    . ezetimibe (ZETIA) 10 MG tablet Take 10 mg by mouth daily.    . fluticasone (FLONASE) 50 MCG/ACT nasal spray Place 2 sprays into both nostrils daily. 16 g 6  . guaifenesin (HUMIBID E) 400 MG TABS tablet Take 400 mg by mouth every 6 (six) hours as needed (allergies/ mucus).    . lansoprazole (PREVACID) 30 MG capsule Take 30 mg by mouth daily.    Marland Kitchen loratadine (CLARITIN) 10 MG tablet Take 10 mg by mouth daily as needed for allergies.    . Multiple Vitamins-Minerals (EQ MULTIVITAMINS ADULT GUMMY) CHEW Chew 1 tablet by mouth in the morning.    . potassium chloride (KLOR-CON) 10 MEQ tablet Take 10 mEq by mouth daily.    Marland Kitchen saccharomyces boulardii (FLORASTOR) 250 MG capsule Take 30 mg by mouth daily.    Marland Kitchen venlafaxine XR (EFFEXOR-XR) 150 MG 24 hr capsule Take 150 mg by mouth daily with breakfast.     No facility-administered medications prior to visit.    Allergies  Allergen Reactions  . Amoxicillin     GI upset   . Augmentin [Amoxicillin-Pot Clavulanate]     GI upset   . Drug [Tape]     Irritation   . Sulfa Antibiotics     Childhood   . Atenolol Rash  . Macrodantin [Nitrofurantoin] Rash    ROS Review of Systems  Constitutional: Positive for fatigue. Negative for diaphoresis, fever and unexpected weight change.  HENT: Negative.   Eyes: Negative  for photophobia and visual disturbance.  Respiratory: Negative.   Cardiovascular: Negative for chest pain and palpitations.  Gastrointestinal: Negative for constipation.  Endocrine: Negative for cold intolerance and heat intolerance.  Genitourinary: Positive for dysuria. Negative for difficulty urinating, frequency and urgency.  Skin: Negative for color change.  Neurological: Negative for speech difficulty.  Hematological: Negative for adenopathy.  Psychiatric/Behavioral: Negative.       Objective:    Physical Exam Vitals and nursing note reviewed.  Constitutional:  Appearance: Normal appearance.  HENT:     Head: Normocephalic and atraumatic.     Right Ear: External ear normal.     Left Ear: External ear normal.  Eyes:     General: No scleral icterus.       Right eye: No discharge.        Left eye: No discharge.     Extraocular Movements: Extraocular movements intact.     Conjunctiva/sclera: Conjunctivae normal.     Pupils: Pupils are equal, round, and reactive to light.  Cardiovascular:     Rate and Rhythm: Normal rate and regular rhythm.  Pulmonary:     Effort: Pulmonary effort is normal.     Breath sounds: Normal breath sounds.  Abdominal:     General: Bowel sounds are normal. There is no distension.     Tenderness: There is no abdominal tenderness. There is no right CVA tenderness, left CVA tenderness, guarding or rebound.     Hernia: No hernia is present.  Musculoskeletal:     Cervical back: No rigidity or tenderness.  Lymphadenopathy:     Cervical: No cervical adenopathy.  Skin:    General: Skin is warm and dry.  Neurological:     Mental Status: She is alert and oriented to person, place, and time.  Psychiatric:        Mood and Affect: Mood normal.        Behavior: Behavior normal.     BP 118/72   Pulse 79   Temp (!) 97.1 F (36.2 C) (Temporal)   Ht 5\' 3"  (1.6 m)   Wt 157 lb 12.8 oz (71.6 kg)   SpO2 97%   BMI 27.95 kg/m  Wt Readings from Last 3  Encounters:  06/14/20 155 lb (70.3 kg)  06/10/20 157 lb 12.8 oz (71.6 kg)  06/09/20 158 lb (71.7 kg)     Health Maintenance Due  Topic Date Due  . TETANUS/TDAP  Never done  . DEXA SCAN  Never done  . PNA vac Low Risk Adult (1 of 2 - PCV13) Never done  . COVID-19 Vaccine (4 - Booster for Pfizer series) 06/03/2020    There are no preventive care reminders to display for this patient.  Lab Results  Component Value Date   TSH 5.98 (H) 06/10/2020   Lab Results  Component Value Date   WBC 51.1 (HH) 06/14/2020   HGB 9.9 (L) 06/14/2020   HCT 33.6 (L) 06/14/2020   MCV 109.4 (H) 06/14/2020   PLT 265 06/14/2020   Lab Results  Component Value Date   NA 141 05/25/2020   K 4.1 05/25/2020   CO2 22 05/25/2020   GLUCOSE 128 (H) 05/25/2020   BUN 14 05/25/2020   CREATININE 1.10 05/25/2020   BILITOT 0.4 05/25/2020   ALKPHOS 102 05/25/2020   AST 15 05/25/2020   ALT 12 05/25/2020   PROT 5.3 (L) 05/25/2020   ALBUMIN 3.7 05/25/2020   CALCIUM 9.0 05/25/2020   ANIONGAP 10 05/20/2020   GFR 47.48 (L) 05/25/2020   No results found for: CHOL No results found for: HDL No results found for: LDLCALC No results found for: TRIG No results found for: CHOLHDL No results found for: HGBA1C    Assessment & Plan:   Problem List Items Addressed This Visit      Cardiovascular and Mediastinum   Hypotension     Genitourinary   UTI (urinary tract infection) - Primary   Relevant Orders   POCT Urinalysis Dipstick (Completed)   Urinalysis,  Routine w reflex microscopic (Completed)   Urine Culture (Completed)     Other   Other specified anemias   Relevant Orders   TSH (Completed)   B12 and Folate Panel (Completed)   Elevated TSH   Relevant Medications   levothyroxine (SYNTHROID) 50 MCG tablet   Other Relevant Orders   TSH (Completed)   Fatigue   Relevant Medications   levothyroxine (SYNTHROID) 50 MCG tablet   Other general symptoms and signs    Relevant Orders   B12 and Folate Panel  (Completed)    Other Visit Diagnoses    Acquired hypothyroidism       Relevant Medications   levothyroxine (SYNTHROID) 50 MCG tablet      Meds ordered this encounter  Medications  . levothyroxine (SYNTHROID) 50 MCG tablet    Sig: Take 1 tablet (50 mcg total) by mouth daily. 30 minutes prior to eating.    Dispense:  90 tablet    Refill:  0    Follow-up: Return in about 2 months (around 08/10/2020).   If recheck of TSH remains elevated will start therapy.  Discussed how to take thyroid medicine.  Urine culture is pending.  Stressed the importance of hydration and preventing chronic UTIs and developing multidrug resistance. Libby Maw, MD   5/26 addendum: have decreased levothyroxine to 1mcg daily.

## 2020-06-11 ENCOUNTER — Telehealth: Payer: Self-pay | Admitting: Cardiology

## 2020-06-11 LAB — URINE CULTURE
MICRO NUMBER:: 11797923
SPECIMEN QUALITY:: ADEQUATE

## 2020-06-11 NOTE — Telephone Encounter (Signed)
-----   Message from Park Liter, MD sent at 06/11/2020 10:44 AM EDT ----- Her TSH is elevated which indicate hypothyroidism please forward this information to her primary care physician for proper management.

## 2020-06-11 NOTE — Telephone Encounter (Signed)
Spoke with patient daughter(on DPR) and notified results. Results forward to PCP.

## 2020-06-11 NOTE — Patient Instructions (Addendum)
DUE TO COVID-19 ONLY ONE VISITOR IS ALLOWED TO COME WITH YOU AND STAY IN THE WAITING ROOM ONLY DURING PRE OP AND PROCEDURE DAY OF SURGERY. THE 1 VISITOR  MAY VISIT WITH YOU AFTER SURGERY IN YOUR PRIVATE ROOM DURING VISITING HOURS ONLY!  YOU NEED TO HAVE A COVID 19 TEST ON: 06/21/20 @ 11:00 AM , THIS TEST MUST BE DONE BEFORE SURGERY,  COVID TESTING SITE [place] [address], IT IS ON THE RIGHT GOING OUT [address] APPROXIMATELY  2 MINUTES PAST [employer] ON THE RIGHT. ONCE YOUR COVID TEST IS COMPLETED,  PLEASE BEGIN THE QUARANTINE INSTRUCTIONS AS OUTLINED IN YOUR HANDOUT.                Sarah [name]   Your procedure is scheduled on: 06/23/20   Report to [facility] Main  Entrance   Report to admitting at: 10:00 AM     Call this number if you have problems the morning of surgery [phone]    Remember: Do not eat solid food :After Midnight. Clear liquids until: 9:00 am.  CLEAR LIQUID DIET  Foods Allowed                                                                     Foods Excluded  Coffee and tea, regular and decaf                             liquids that you cannot  Plain Jell-O any favor except red or purple                                           see through such as: Fruit ices (not with fruit pulp)                                     milk, soups, orange juice  Iced Popsicles                                    All solid food Carbonated beverages, regular and diet                                    Cranberry, grape and apple juices Sports drinks like Gatorade Lightly seasoned clear broth or consume(fat free) Sugar, honey syrup  Sample Menu Breakfast                                Lunch                                     Supper Cranberry juice  Beef broth                            Chicken broth Jell-O                                     Grape juice                           Apple juice Coffee or tea                         Jell-O                                      Popsicle                                                Coffee or tea                        Coffee or tea  _____________________________________________________________________  BRUSH YOUR TEETH MORNING OF SURGERY AND RINSE YOUR MOUTH OUT, NO CHEWING GUM CANDY OR MINTS.    Take these medicines the morning of surgery with A SIP OF WATER: Buspar,lansoprazole,loratadine,florastor.Use flonase as usual.                               You may not have any metal on your body including hair pins and              piercings  Do not wear jewelry, make-up, lotions, powders or perfumes, deodorant             Do not wear nail polish on your fingernails.  Do not shave  48 hours prior to surgery.    Do not bring valuables to the hospital. [place].  Contacts, dentures or bridgework may not be worn into surgery.  Leave suitcase in the car. After surgery it may be brought to your room.     Patients discharged the day of surgery will not be allowed to drive home. IF YOU ARE HAVING SURGERY AND GOING HOME THE SAME DAY, YOU MUST HAVE AN ADULT TO DRIVE YOU HOME AND BE WITH YOU FOR 24 HOURS. YOU MAY GO HOME BY TAXI OR UBER OR ORTHERWISE, BUT AN ADULT MUST ACCOMPANY YOU HOME AND STAY WITH YOU FOR 24 HOURS.  Name and phone number of your driver:  Special Instructions: N/A              Please read over the following fact sheets you were given: _____________________________________________________________________         [facility] - Preparing for Surgery Before surgery, you can play an important role.  Because skin is not sterile, your skin needs to be as free of germs as possible.  You can reduce the number of germs on your skin by washing with CHG (chlorahexidine gluconate) soap before surgery.  CHG is  an antiseptic cleaner which kills germs and bonds with the skin to continue killing germs even after  washing. Please DO NOT use if you have an allergy to CHG or antibacterial soaps.  If your skin becomes reddened/irritated stop using the CHG and inform your nurse when you arrive at Short Stay. Do not shave (including legs and underarms) for at least 48 hours prior to the first CHG shower.  You may shave your face/neck. Please follow these instructions carefully:  1.  Shower with CHG Soap the night before surgery and the  morning of Surgery.  2.  If you choose to wash your hair, wash your hair first as usual with your  normal  shampoo.  3.  After you shampoo, rinse your hair and body thoroughly to remove the  shampoo.                           4.  Use CHG as you would any other liquid soap.  You can apply chg directly  to the skin and wash                       Gently with a scrungie or clean washcloth.  5.  Apply the CHG Soap to your body ONLY FROM THE NECK DOWN.   Do not use on face/ open                           Wound or open sores. Avoid contact with eyes, ears mouth and genitals (private parts).                       Wash face,  Genitals (private parts) with your normal soap.             6.  Wash thoroughly, paying special attention to the area where your surgery  will be performed.  7.  Thoroughly rinse your body with warm water from the neck down.  8.  DO NOT shower/wash with your normal soap after using and rinsing off  the CHG Soap.                9.  Pat yourself dry with a clean towel.            10.  Wear clean pajamas.            11.  Place clean sheets on your bed the night of your first shower and do not  sleep with pets. Day of Surgery : Do not apply any lotions/deodorants the morning of surgery.  Please wear clean clothes to the hospital/surgery center.  FAILURE TO FOLLOW THESE INSTRUCTIONS MAY RESULT IN THE CANCELLATION OF YOUR SURGERY PATIENT SIGNATURE_________________________________  NURSE  SIGNATURE__________________________________  ________________________________________________________________________

## 2020-06-11 NOTE — Telephone Encounter (Signed)
Patient's daughter is returning call to discuss EKG results.

## 2020-06-14 ENCOUNTER — Encounter (HOSPITAL_COMMUNITY)
Admission: RE | Admit: 2020-06-14 | Discharge: 2020-06-14 | Disposition: A | Discharge status: Home / Self Care | Payer: Medicare Other | Source: Ambulatory Visit | Attending: Urology | Admitting: Urology

## 2020-06-14 ENCOUNTER — Encounter (HOSPITAL_COMMUNITY): Payer: Self-pay

## 2020-06-14 ENCOUNTER — Other Ambulatory Visit: Payer: Self-pay

## 2020-06-14 DIAGNOSIS — Z01812 Encounter for preprocedural laboratory examination: Secondary | ICD-10-CM | POA: Insufficient documentation

## 2020-06-14 HISTORY — DX: Unspecified osteoarthritis, unspecified site: M19.90

## 2020-06-14 HISTORY — DX: Anxiety disorder, unspecified: F41.9

## 2020-06-14 HISTORY — DX: Dyspnea, unspecified: R06.00

## 2020-06-14 LAB — CBC
HCT: 33.6 % — ABNORMAL LOW (ref 36.0–46.0)
Hemoglobin: 9.9 g/dL — ABNORMAL LOW (ref 12.0–15.0)
MCH: 32.2 pg (ref 26.0–34.0)
MCHC: 29.5 g/dL — ABNORMAL LOW (ref 30.0–36.0)
MCV: 109.4 fL — ABNORMAL HIGH (ref 80.0–100.0)
Platelets: 265 10*3/uL (ref 150–400)
RBC: 3.07 MIL/uL — ABNORMAL LOW (ref 3.87–5.11)
RDW: 19.9 % — ABNORMAL HIGH (ref 11.5–15.5)
WBC: 51.1 10*3/uL (ref 4.0–10.5)
nRBC: 0.1 % (ref 0.0–0.2)

## 2020-06-14 NOTE — Progress Notes (Signed)
Lab. Results: WBC: 51.1.

## 2020-06-14 NOTE — Progress Notes (Signed)
COVID Vaccine Completed: Yes Date COVID Vaccine completed: 12/04/19 booster COVID vaccine manufacturer: Pfizer      PCP - Dr. Jon Billings. LOV: 06/10/20 Cardiologist - Dr. Jenne Campus. LOV: 06/09/20  Chest x-ray - 05/17/20 EKG - 06/09/20 Stress Test -  ECHO - 06/09/20 Cardiac Cath -  Pacemaker/ICD device last checked:  Sleep Study -  CPAP -   Fasting Blood Sugar -  Checks Blood Sugar _____ times a day  Blood Thinner Instructions: Aspirin Instructions: Last Dose:  Anesthesia review: Hx: AKI,MI,hypotension.Pt. get SOB after few steps,as per her thi's been going on for several months.  Patient denies shortness of breath, fever, cough and chest pain at PAT appointment   Patient verbalized understanding of instructions that were given to them at the PAT appointment. Patient was also instructed that they will need to review over the PAT instructions again at home before surgery.

## 2020-06-15 ENCOUNTER — Telehealth: Payer: Self-pay | Admitting: Cardiology

## 2020-06-15 MED ORDER — LEVOTHYROXINE SODIUM 50 MCG PO TABS
50.0000 ug | ORAL_TABLET | Freq: Every day | ORAL | 0 refills | Status: DC
Start: 2020-06-15 — End: 2020-07-15

## 2020-06-15 NOTE — Addendum Note (Signed)
Addended by: Jon Billings on: 06/15/2020 12:42 PM   Modules accepted: Orders

## 2020-06-15 NOTE — Telephone Encounter (Signed)
       [facility] Pre-operative Risk Assessment    Patient Name: Sarah [name]  DOB: 11-15-1940  MRN: [id number]   [place]: - Please ensure there is not already an duplicate clearance open for this procedure. - Under Visit Info/Reason for Call, type in Other and utilize the format Clearance MM/DD/YY or Clearance TBD. Do not use dashes or single digits. - If request is for dental extraction, please clarify the # of teeth to be extracted.  Request for surgical clearance:  1. What type of surgery is being performed? Ureteroscopy, balloon dilation and stent exchange   2. When is this surgery scheduled? 06/23/20  3. What type of clearance is required (medical clearance vs. Pharmacy clearance to hold med vs. Both)? Medical  4. Are there any medications that need to be held prior to surgery and how long?   5. Practice name and name of physician performing surgery? Dr. Gloriann Loan - [facility]  6. What is the office phone number? [place]   7.   What is the office fax number? [phone]  8.   Anesthesia type (None, local, MAC, general) ? General   [name] 06/15/2020, 2:38 PM  _________________________________________________________________   (provider comments below)

## 2020-06-15 NOTE — Progress Notes (Incomplete Revision)
Anesthesia Chart Review   Case: [id number] Date/Time: 06/23/20 1145   Procedure: CYSTOSCOPY LEFT /URETEROSCOPY/POSSIBLE HOLMIUM LASER/STENT  EXCHANGE POSSIBLE BALLOON DILATION (Left ) - REQUESTING 1 HR   Anesthesia type: General   Pre-op diagnosis: LEFT HYDRONEPHROSIS   Location: [facility] PROCEDURE [facility] / [facility]   Surgeons: Lucas Mallow, MD      DISCUSSION:79 y.o. never smoker with h/o GERD, HTN, CLL, left hydronephrosis scheduled for above procedure 06/23/2020 with Dr. Link Snuffer.   Pt last seen by cardiology 06/09/2020. Per OV note Zio patch ordered due to palpitations.  Echo ordered to assess LV EF, pending.   Cardiac clearance requested.  VS: BP 109/73   Pulse (!) 101   Temp 37.1 C (Oral)   Ht 5\' 3"  (1.6 m)   Wt 70.3 kg   SpO2 99%   BMI 27.46 kg/m   PROVIDERS: Libby Maw, MD is PCP   Jenne Campus, MD is Cardiologist  LABS: Labs reviewed: Acceptable for surgery. (all labs ordered are listed, but only abnormal results are displayed)  Labs Reviewed  CBC - Abnormal; Notable for the following components:      Result Value   WBC 51.1 (*)    RBC 3.07 (*)    Hemoglobin 9.9 (*)    HCT 33.6 (*)    MCV 109.4 (*)    MCHC 29.5 (*)    RDW 19.9 (*)    All other components within normal limits     IMAGES:   EKG: 06/09/2020 Rate 90 bpm  NSR RBBB LAFB Bifascicular block Anterior infarct, age undetermined  CV:  Past Medical History:  Diagnosis Date  . Acute cystitis with hematuria 12/15/2019  . AKI (acute kidney injury) ([place]) 05/06/2020  . Allergic rhinitis due to pollen 12/08/2019  . Allergy   . Anxiety   . Arthritis   . CLL (chronic lymphocytic leukemia) ([place])   . Dyspnea    continuos  . Elevated cholesterol 12/08/2019  . GERD (gastroesophageal reflux disease)   . Gout   . History of gout 12/08/2019  . History of heart attack   . History of MI (myocardial infarction) 12/08/2019  . HLD (hyperlipidemia)   . Hospital discharge follow-up  05/25/2020  . Hypertension   . Hypokalemia 05/25/2020  . Hypotension 05/06/2020  . 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 Non Hodgkin's lymphoma ([place])   . Urinary frequency 12/08/2019  . UTI (urinary tract infection) 05/06/2020    Past Surgical History:  Procedure Laterality Date  . ABDOMINAL HYSTERECTOMY    . APPENDECTOMY    . CHOLECYSTECTOMY    . CYSTOSCOPY WITH STENT PLACEMENT Left 05/12/2020   Procedure: CYSTOSCOPY WITH STENT PLACEMENT;  Surgeon: Lucas Mallow, MD;  Location: [facility];  Service: Urology;  Laterality: Left;  . Double masectomy    . KIDNEY SURGERY    . MOHS SURGERY    . TONSILECTOMY, ADENOIDECTOMY, BILATERAL MYRINGOTOMY AND TUBES      MEDICATIONS: . acetaminophen (TYLENOL) 325 MG tablet  . busPIRone (BUSPAR) 15 MG tablet  . CRANBERRY EXTRACT PO  . ezetimibe (ZETIA) 10 MG tablet  . fluticasone (FLONASE) 50 MCG/ACT nasal spray  . guaifenesin (HUMIBID E) 400 MG TABS tablet  . lansoprazole (PREVACID) 30 MG capsule  . levothyroxine (SYNTHROID) 50 MCG tablet  . loratadine (CLARITIN) 10 MG tablet  . Multiple Vitamins-Minerals (EQ MULTIVITAMINS ADULT GUMMY) CHEW  . potassium chloride (KLOR-CON) 10 MEQ tablet  . saccharomyces boulardii (FLORASTOR) 250 MG capsule  . venlafaxine XR (EFFEXOR-XR)  150 MG 24 hr capsule   No current facility-administered medications for this encounter.

## 2020-06-15 NOTE — Progress Notes (Addendum)
Anesthesia Chart Review   Case: [id number] Date/Time: 06/23/20 1145   Procedure: CYSTOSCOPY LEFT /URETEROSCOPY/POSSIBLE HOLMIUM LASER/STENT  EXCHANGE POSSIBLE BALLOON DILATION (Left ) - REQUESTING 1 HR   Anesthesia type: General   Pre-op diagnosis: LEFT HYDRONEPHROSIS   Location: [facility] / [facility]   Surgeons: Lucas Mallow, MD      DISCUSSION:80 y.o. never smoker with h/o GERD, HTN, CLL, left hydronephrosis scheduled for above procedure 06/23/2020 with Dr. Link Snuffer.   Pt last seen by cardiology 06/09/2020 for evaluation of hypotensio and palpitations. Per OV note Zio patch ordered due to palpitations.  Echo ordered to assess LV EF, pending.   Cardiac clearance requested.   Addendum 06/22/20:  Discussed with cardiology, ok to proceed. Pt preop not 06/15/2020, "Chart reviewed as part of pre-operative protocol coverage. Patient was contacted 06/22/2020 in reference to pre-operative risk assessment for pending surgery as outlined below.  Kada Friesen was last seen on 06/09/20 by Dr. Agustin Cree.  Since that day, Quenesha Douglass has progressed with physical therapy. She is unable to complete 4.0 METS, but dyspnea is improving suggesting this may in part be due to deconditioning after a protracted hospitalization and illness. She has already had a similar procedure without cardiac complications. She understands the risk given we do not have a formal echocardiogram. She is willing to proceed. We discussed possibly rescinding her DNR for the surgery.   Therefore, based on ACC/AHA guidelines, the patient would be at acceptable risk for the planned procedure without further cardiovascular testing."  VS: BP 109/73   Pulse (!) 101   Temp 37.1 C (Oral)   Ht 5\' 3"  (1.6 m)   Wt 70.3 kg   SpO2 99%   BMI 27.46 kg/m   PROVIDERS: Libby Maw, MD is PCP   Jenne Campus, MD is Cardiologist  LABS: Labs reviewed: Acceptable for surgery. (all labs ordered are listed, but only abnormal  results are displayed)  Labs Reviewed  CBC - Abnormal; Notable for the following components:      Result Value   WBC 51.1 (*)    RBC 3.07 (*)    Hemoglobin 9.9 (*)    HCT 33.6 (*)    MCV 109.4 (*)    MCHC 29.5 (*)    RDW 19.9 (*)    All other components within normal limits     IMAGES:   EKG: 06/09/2020 Rate 90 bpm  NSR RBBB LAFB Bifascicular block Anterior infarct, age undetermined  CV:  Past Medical History:  Diagnosis Date  . Acute cystitis with hematuria 12/15/2019  . AKI (acute kidney injury) ([place]) 05/06/2020  . Allergic rhinitis due to pollen 12/08/2019  . Allergy   . Anxiety   . Arthritis   . CLL (chronic lymphocytic leukemia) ([place])   . Dyspnea    continuos  . Elevated cholesterol 12/08/2019  . GERD (gastroesophageal reflux disease)   . Gout   . History of gout 12/08/2019  . History of heart attack   . History of MI (myocardial infarction) 12/08/2019  . HLD (hyperlipidemia)   . Hospital discharge follow-up 05/25/2020  . Hypertension   . Hypokalemia 05/25/2020  . Hypotension 05/06/2020  . 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 Non Hodgkin's lymphoma ([place])   . Urinary frequency 12/08/2019  . UTI (urinary tract infection) 05/06/2020    Past Surgical History:  Procedure Laterality Date  . ABDOMINAL HYSTERECTOMY    . APPENDECTOMY    . CHOLECYSTECTOMY    . [place]  Left 05/12/2020   Procedure: CYSTOSCOPY WITH STENT PLACEMENT;  Surgeon: Lucas Mallow, MD;  Location: [facility];  Service: Urology;  Laterality: Left;  . Double masectomy    . KIDNEY SURGERY    . MOHS SURGERY    . TONSILECTOMY, ADENOIDECTOMY, BILATERAL MYRINGOTOMY AND TUBES      MEDICATIONS: . acetaminophen (TYLENOL) 325 MG tablet  . busPIRone (BUSPAR) 15 MG tablet  . CRANBERRY EXTRACT PO  . ezetimibe (ZETIA) 10 MG tablet  . fluticasone (FLONASE) 50 MCG/ACT nasal spray  . guaifenesin (HUMIBID E) 400 MG TABS tablet  . lansoprazole (PREVACID) 30 MG capsule  .  levothyroxine (SYNTHROID) 50 MCG tablet  . loratadine (CLARITIN) 10 MG tablet  . Multiple Vitamins-Minerals (EQ MULTIVITAMINS ADULT GUMMY) CHEW  . potassium chloride (KLOR-CON) 10 MEQ tablet  . saccharomyces boulardii (FLORASTOR) 250 MG capsule  . venlafaxine XR (EFFEXOR-XR) 150 MG 24 hr capsule   No current facility-administered medications for this encounter.   Konrad Felix, PA-C [facility] [phone]

## 2020-06-16 DIAGNOSIS — R002 Palpitations: Secondary | ICD-10-CM

## 2020-06-16 NOTE — Telephone Encounter (Signed)
Patient was recently seen about a week ago by Dr. Agustin Cree for palpitation and hypotension.  Dr. Agustin Cree recommended a heart monitor and echocardiogram as preliminary work-up.  Neither will come back prior to her urology procedure.  She has a history of CLL and MI in 1999.  Recent white blood cell count was 51.1.  Hemoglobin 9.9.  Dr. Agustin Cree, since her monitor and echocardiogram has not come back, we do recommend she still proceed with urology procedure or do recommend delayed the procedure?  Please forward your response to P [facility] PREOP

## 2020-06-21 ENCOUNTER — Telehealth: Payer: Self-pay

## 2020-06-21 ENCOUNTER — Other Ambulatory Visit (HOSPITAL_COMMUNITY)
Admission: RE | Admit: 2020-06-21 | Discharge: 2020-06-21 | Disposition: A | Discharge status: Home / Self Care | Payer: Medicare Other | Source: Ambulatory Visit | Attending: Urology | Admitting: Urology

## 2020-06-21 DIAGNOSIS — Z20822 Contact with and (suspected) exposure to covid-19: Secondary | ICD-10-CM | POA: Insufficient documentation

## 2020-06-21 DIAGNOSIS — Z01812 Encounter for preprocedural laboratory examination: Secondary | ICD-10-CM | POA: Diagnosis present

## 2020-06-21 MED ORDER — DIGOXIN 125 MCG PO TABS: 0.1250 mg | ORAL_TABLET | Freq: Every day | ORAL | 1 refills | Status: DC

## 2020-06-21 NOTE — Telephone Encounter (Signed)
Patient notified of test results. Rx sent to her pharmacy.

## 2020-06-21 NOTE — Telephone Encounter (Signed)
-----   Message from Park Liter, MD sent at 06/21/2020 11:35 AM EDT ----- She does have some supraventricular tachycardia, since she is hypotensive probably the best option will be to try small dose of digoxin.  Lets give her 0.125 daily

## 2020-06-22 LAB — SARS CORONAVIRUS 2 (TAT 6-24 HRS): SARS Coronavirus 2: NEGATIVE

## 2020-06-22 NOTE — Telephone Encounter (Signed)
   Name: Sarah [name]  DOB: 02/18/1941  MRN: [id number]   Primary Cardiologist: Jenne Campus, MD  Chart reviewed as part of pre-operative protocol coverage. Patient was contacted 06/22/2020 in reference to pre-operative risk assessment for pending surgery as outlined below.  Sarah [name] was last seen on 06/09/20 by Dr. Agustin Cree.  Since that day, Sarah [name] has progressed with physical therapy. She is unable to complete 4.0 METS, but dyspnea is improving suggesting this may in part be due to deconditioning after a protracted hospitalization and illness. She has already had a similar procedure without cardiac complications. She understands the risk given we do not have a formal echocardiogram. She is willing to proceed. We discussed possibly rescinding her DNR for the surgery. Case discussed with Dr. Agustin Cree and he agrees with the above. We will still plan for echocardiogram on 07/08/20. She may proceed with surgery tomorrow.    I will route this recommendation to the requesting party via Epic fax function and remove from pre-op pool. Please call with questions.  Tami Lin Angad Nabers, PA 06/22/2020, 2:57 PM

## 2020-06-23 ENCOUNTER — Encounter (HOSPITAL_COMMUNITY)
Admission: RE | Disposition: A | Discharge status: Home / Self Care | Payer: Self-pay | Source: Home / Self Care | Attending: Urology

## 2020-06-23 ENCOUNTER — Ambulatory Visit (HOSPITAL_COMMUNITY)
Admission: RE | Admit: 2020-06-23 | Discharge: 2020-06-23 | Disposition: A | Discharge status: Home / Self Care | Payer: Medicare Other | Attending: Urology | Admitting: Urology

## 2020-06-23 ENCOUNTER — Ambulatory Visit (HOSPITAL_COMMUNITY): Payer: Medicare Other | Admitting: Physician Assistant

## 2020-06-23 ENCOUNTER — Encounter (HOSPITAL_COMMUNITY): Payer: Self-pay | Admitting: Urology

## 2020-06-23 ENCOUNTER — Ambulatory Visit (HOSPITAL_COMMUNITY): Payer: Medicare Other

## 2020-06-23 DIAGNOSIS — Z8249 Family history of ischemic heart disease and other diseases of the circulatory system: Secondary | ICD-10-CM | POA: Diagnosis not present

## 2020-06-23 DIAGNOSIS — Z859 Personal history of malignant neoplasm, unspecified: Secondary | ICD-10-CM | POA: Diagnosis not present

## 2020-06-23 DIAGNOSIS — I252 Old myocardial infarction: Secondary | ICD-10-CM | POA: Diagnosis not present

## 2020-06-23 DIAGNOSIS — Z853 Personal history of malignant neoplasm of breast: Secondary | ICD-10-CM | POA: Insufficient documentation

## 2020-06-23 DIAGNOSIS — Z9049 Acquired absence of other specified parts of digestive tract: Secondary | ICD-10-CM | POA: Diagnosis not present

## 2020-06-23 DIAGNOSIS — N3020 Other chronic cystitis without hematuria: Secondary | ICD-10-CM | POA: Diagnosis not present

## 2020-06-23 DIAGNOSIS — N1330 Unspecified hydronephrosis: Secondary | ICD-10-CM | POA: Diagnosis not present

## 2020-06-23 DIAGNOSIS — Z800 Family history of malignant neoplasm of digestive organs: Secondary | ICD-10-CM | POA: Insufficient documentation

## 2020-06-23 DIAGNOSIS — Z9013 Acquired absence of bilateral breasts and nipples: Secondary | ICD-10-CM | POA: Insufficient documentation

## 2020-06-23 DIAGNOSIS — Z888 Allergy status to other drugs, medicaments and biological substances status: Secondary | ICD-10-CM | POA: Insufficient documentation

## 2020-06-23 DIAGNOSIS — I10 Essential (primary) hypertension: Secondary | ICD-10-CM | POA: Diagnosis not present

## 2020-06-23 DIAGNOSIS — Z86718 Personal history of other venous thrombosis and embolism: Secondary | ICD-10-CM | POA: Diagnosis not present

## 2020-06-23 DIAGNOSIS — Z90710 Acquired absence of both cervix and uterus: Secondary | ICD-10-CM | POA: Insufficient documentation

## 2020-06-23 DIAGNOSIS — E7800 Pure hypercholesterolemia, unspecified: Secondary | ICD-10-CM | POA: Insufficient documentation

## 2020-06-23 DIAGNOSIS — Z87440 Personal history of urinary (tract) infections: Secondary | ICD-10-CM | POA: Insufficient documentation

## 2020-06-23 HISTORY — PX: CYSTOSCOPY/URETEROSCOPY/HOLMIUM LASER/STENT PLACEMENT: SHX6546

## 2020-06-23 LAB — BASIC METABOLIC PANEL
Anion gap: 10 (ref 5–15)
BUN: 18 mg/dL (ref 8–23)
CO2: 25 mmol/L (ref 22–32)
Calcium: 9.3 mg/dL (ref 8.9–10.3)
Chloride: 105 mmol/L (ref 98–111)
Creatinine, Ser: 0.98 mg/dL (ref 0.44–1.00)
GFR, Estimated: 58 mL/min — ABNORMAL LOW (ref >60)
Glucose, Bld: 98 mg/dL (ref 70–99)
Potassium: 4.3 mmol/L (ref 3.5–5.1)
Sodium: 140 mmol/L (ref 135–145)

## 2020-06-23 SURGERY — CYSTOSCOPY/URETEROSCOPY/HOLMIUM LASER/STENT PLACEMENT
Anesthesia: General | Site: Ureter | Laterality: Left

## 2020-06-23 MED ORDER — DEXAMETHASONE SODIUM PHOSPHATE 10 MG/ML IJ SOLN
INTRAMUSCULAR | Status: DC | PRN
  Administered 2020-06-23: 4 mg via INTRAVENOUS

## 2020-06-23 MED ORDER — LACTATED RINGERS IV SOLN: INTRAVENOUS | Status: DC

## 2020-06-23 MED ORDER — ONDANSETRON HCL 4 MG/2ML IJ SOLN
INTRAMUSCULAR | Status: DC | PRN
  Administered 2020-06-23: 4 mg via INTRAVENOUS

## 2020-06-23 MED ORDER — LIDOCAINE 2% (20 MG/ML) 5 ML SYRINGE
INTRAMUSCULAR | Status: DC | PRN
  Administered 2020-06-23: 60 mg via INTRAVENOUS

## 2020-06-23 MED ORDER — FENTANYL CITRATE (PF) 100 MCG/2ML IJ SOLN
INTRAMUSCULAR | Status: DC | PRN
  Administered 2020-06-23 (×4): 25 ug via INTRAVENOUS

## 2020-06-23 MED ORDER — ONDANSETRON HCL 4 MG/2ML IJ SOLN
INTRAMUSCULAR | Status: AC
  Filled 2020-06-23: qty 2

## 2020-06-23 MED ORDER — ORAL CARE MOUTH RINSE: 15.0000 mL | Freq: Once | OROMUCOSAL | Status: AC

## 2020-06-23 MED ORDER — CIPROFLOXACIN IN D5W 400 MG/200ML IV SOLN
400.0000 mg | INTRAVENOUS | Status: AC
  Administered 2020-06-23: 400 mg via INTRAVENOUS

## 2020-06-23 MED ORDER — PROPOFOL 10 MG/ML IV BOLUS
INTRAVENOUS | Status: AC
  Filled 2020-06-23: qty 20

## 2020-06-23 MED ORDER — PHENYLEPHRINE 40 MCG/ML (10ML) SYRINGE FOR IV PUSH (FOR BLOOD PRESSURE SUPPORT)
PREFILLED_SYRINGE | INTRAVENOUS | Status: DC | PRN
  Administered 2020-06-23: 160 ug via INTRAVENOUS

## 2020-06-23 MED ORDER — CIPROFLOXACIN IN D5W 400 MG/200ML IV SOLN
INTRAVENOUS | Status: AC
  Filled 2020-06-23: qty 200

## 2020-06-23 MED ORDER — HYDROCODONE-ACETAMINOPHEN 5-325 MG PO TABS: 1.0000 | ORAL_TABLET | ORAL | 0 refills | Status: DC | PRN

## 2020-06-23 MED ORDER — FENTANYL CITRATE (PF) 100 MCG/2ML IJ SOLN: 25.0000 ug | INTRAMUSCULAR | Status: DC | PRN

## 2020-06-23 MED ORDER — PROPOFOL 10 MG/ML IV BOLUS
INTRAVENOUS | Status: DC | PRN
  Administered 2020-06-23: 80 mg via INTRAVENOUS

## 2020-06-23 MED ORDER — FENTANYL CITRATE (PF) 100 MCG/2ML IJ SOLN
INTRAMUSCULAR | Status: AC
  Filled 2020-06-23: qty 2

## 2020-06-23 MED ORDER — IOHEXOL 300 MG/ML  SOLN
INTRAMUSCULAR | Status: DC | PRN
  Administered 2020-06-23: 20 mL

## 2020-06-23 MED ORDER — ACETAMINOPHEN 500 MG PO TABS: 1000.0000 mg | ORAL_TABLET | Freq: Once | ORAL | Status: AC

## 2020-06-23 MED ORDER — PHENYLEPHRINE HCL (PRESSORS) 10 MG/ML IV SOLN
INTRAVENOUS | Status: AC
  Filled 2020-06-23: qty 1

## 2020-06-23 MED ORDER — SODIUM CHLORIDE 0.9 % IR SOLN
Status: DC | PRN
  Administered 2020-06-23: 6000 mL

## 2020-06-23 MED ORDER — DEXAMETHASONE SODIUM PHOSPHATE 10 MG/ML IJ SOLN
INTRAMUSCULAR | Status: AC
  Filled 2020-06-23: qty 1

## 2020-06-23 MED ORDER — SODIUM CHLORIDE 0.9 % IR SOLN
Status: DC | PRN
  Administered 2020-06-23: 1000 mL

## 2020-06-23 MED ORDER — CHLORHEXIDINE GLUCONATE 0.12 % MT SOLN
15.0000 mL | Freq: Once | OROMUCOSAL | Status: AC
  Administered 2020-06-23: 15 mL via OROMUCOSAL

## 2020-06-23 MED ORDER — ACETAMINOPHEN 500 MG PO TABS
ORAL_TABLET | ORAL | Status: AC
  Administered 2020-06-23: 1000 mg via ORAL
  Filled 2020-06-23: qty 2

## 2020-06-23 SURGICAL SUPPLY — 24 items
BAG URO CATCHER STRL LF (MISCELLANEOUS) ×2 IMPLANT
BASKET LASER NITINOL 1.9FR (BASKET) IMPLANT
BASKET ZERO TIP NITINOL 2.4FR (BASKET) IMPLANT
CATH INTERMIT  6FR 70CM (CATHETERS) ×2 IMPLANT
CATH URET DUAL LUMEN 6-10FR 50 (CATHETERS) ×2 IMPLANT
CLOTH BEACON ORANGE TIMEOUT ST (SAFETY) ×2 IMPLANT
EXTRACTOR STONE 1.7FRX115CM (UROLOGICAL SUPPLIES) IMPLANT
GLOVE SURG ENC MOIS LTX SZ7.5 (GLOVE) ×2 IMPLANT
GOWN STRL REUS W/TWL XL LVL3 (GOWN DISPOSABLE) ×2 IMPLANT
GUIDEWIRE ANG ZIPWIRE 038X150 (WIRE) IMPLANT
GUIDEWIRE STR DUAL SENSOR (WIRE) ×2 IMPLANT
KIT BALLN UROMAX 15FX4 (MISCELLANEOUS) ×1 IMPLANT
KIT BALLN UROMAX 26 75X4 (MISCELLANEOUS) ×1
KIT TURNOVER KIT A (KITS) ×2 IMPLANT
LASER FIB FLEXIVA PULSE ID 365 (Laser) IMPLANT
MANIFOLD NEPTUNE II (INSTRUMENTS) ×2 IMPLANT
PACK CYSTO (CUSTOM PROCEDURE TRAY) ×2 IMPLANT
SHEATH URETERAL 12FRX28CM (UROLOGICAL SUPPLIES) IMPLANT
SHEATH URETERAL 12FRX35CM (MISCELLANEOUS) IMPLANT
STENT ENDOURETEROTOMY 7-14 26C (STENTS) ×2 IMPLANT
TRACTIP FLEXIVA PULS ID 200XHI (Laser) IMPLANT
TRACTIP FLEXIVA PULSE ID 200 (Laser)
TUBING CONNECTING 10 (TUBING) ×2 IMPLANT
TUBING UROLOGY SET (TUBING) IMPLANT

## 2020-06-23 NOTE — Op Note (Signed)
Operative Note  Preoperative diagnosis:  1.  Left hydronephrosis  Postoperative diagnosis: 1.  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  Cystoscopy with left retrograde pyelogram, left diagnostic ureteroscopy, balloon dilation of ureteral stricture, ureteral stent placement/exchange  Surgeon: Link Snuffer, MD  Assistants: None  Anesthesia: General  Complications: None immediate  EBL: Minimal  Specimens: 1.  None  Drains/Catheters: 1.  Endo ureterotomy stent  Intraoperative findings: 1.  Normal urethra and bladder mucosa 2.  Left retrograde pyelogram revealed some hydronephrosis in the left kidney indicating likely a chronic process given that she had a stent prior.  There was a potential transition point at the ureteropelvic junction.  Diagnostic ureteroscopy revealed an open ureteropelvic junction that was able to be transversed with the scope but it did appear to have some narrowing.  May have been open secondary to her stent being in place for the past month.  I did not see any evidence of extrinsic compression.  The kidney itself was normal without any tumors or stones.  Indication: 80 year old female who is status post left ureteral stent placement.  She was hospitalized with a UTI and CT scan showed left-sided hydronephrosis with some surrounding lymphadenopathy versus chronic UPJ obstruction.  Renal scan confirmed obstruction with 23% function and this is the reason she underwent stent placement at that time.  She presents today for the previously mentioned operation.  Description of procedure:  The patient was identified and consent was obtained.  The patient was taken to the operating room and placed in the supine position.  The patient was placed under general anesthesia.  Perioperative antibiotics were administered.  The patient was placed in dorsal lithotomy.  Patient was prepped and draped in a standard sterile fashion and a  timeout was performed.  A 21 French rigid cystoscope was advanced into the urethra and into the bladder.  Complete cystoscopy was performed with no abnormal findings.  The stent was grasped and pulled just beyond the urethral meatus.  A sensor wire was advanced through the stent and up to the kidney under fluoroscopic guidance.  The stent was withdrawn.  A dual-lumen ureteral catheter was advanced over the wire and into the distal ureter under fluoroscopic guidance.  Retrograde pyelogram was performed with findings noted above.  I then advanced a digital ureteroscope over the wire under fluoroscopic guidance as well as direct visualization and it passed easily up the ureter and into the kidney.  The wire was withdrawn.  I performed a complete pyeloscopy.  There was evidence of chronic dilation.  Otherwise the kidney was normal without any masses, stones.  I carefully inspected the ureteropelvic junction.  It was open and able to be easily passed with the scope but there did appear to be narrowing at the level of the UPJ.  I did not see any evidence of extrinsic compression.  I reintroduced the wire into the kidney and withdrew the scope.  Under fluoroscopic guidance, I advanced a 4 cm ureteral balloon dilator over the wire and transverse the area of narrowing and subsequently dilated this area to 16 [place].  I dilated this for approximately 2 minutes.  I then deflated the balloon and withdrew it.  I then advanced an Endo ureterotomy stent over the wire and placed this under fluoroscopic guidance.  Fluoroscopy confirmed proximal placement and I advanced the scope to confirm distal placement.  I drained the bladder and withdrew the scope.  Patient tolerated procedure well and was stable postoperatively.  Plan: Follow-up in 4 to 6 weeks for stent removal.  I told her family member that if she has significant side effects from the stent we can likely remove this a little bit sooner.  Ideally we will keep it for 4 to  6 weeks however.  Continue trimethoprim nightly UTI prophylaxis.

## 2020-06-23 NOTE — Anesthesia Preprocedure Evaluation (Addendum)
Anesthesia Evaluation  Patient identified by MRN, date of birth, ID band Patient awake    Reviewed: Allergy & Precautions, NPO status , Patient's Chart, lab work & pertinent test results  Airway Mallampati: I  TM Distance: >3 FB Neck ROM: Full    Dental no notable dental hx. (+) Teeth Intact, Dental Advisory Given   Pulmonary shortness of breath,    Pulmonary exam normal breath sounds clear to auscultation       Cardiovascular hypertension, Pt. on medications + CAD and + Past MI  Normal cardiovascular exam Rhythm:Regular Rate:Normal     Neuro/Psych PSYCHIATRIC DISORDERS Anxiety negative neurological ROS     GI/Hepatic Neg liver ROS, GERD  Medicated and Controlled,  Endo/Other  negative endocrine ROS  Renal/GU negative Renal ROS  negative genitourinary   Musculoskeletal negative musculoskeletal ROS (+)   Abdominal   Peds  Hematology  (+) Blood dyscrasia (Hgb 9.9), anemia , CLL   Anesthesia Other Findings Left hydronephrosis  Reproductive/Obstetrics                            Anesthesia Physical Anesthesia Plan  ASA: III  Anesthesia Plan: General   Post-op Pain Management:    Induction: Intravenous  PONV Risk Score and Plan: 3 and Ondansetron, Dexamethasone and Treatment may vary due to age or medical condition  Airway Management Planned: LMA  Additional Equipment:   Intra-op Plan:   Post-operative Plan: Extubation in OR  Informed Consent: I have reviewed the patients History and Physical, chart, labs and discussed the procedure including the risks, benefits and alternatives for the proposed anesthesia with the patient or authorized representative who has indicated his/her understanding and acceptance.     Dental advisory given  Plan Discussed with: CRNA  Anesthesia Plan Comments:         Anesthesia Quick Evaluation

## 2020-06-23 NOTE — Discharge Instructions (Signed)

## 2020-06-23 NOTE — Transfer of Care (Signed)
Immediate Anesthesia Transfer of Care Note  Patient: Sarah [name]  Procedure(s) Performed: CYSTOSCOPY LEFT diagnostic /URETEROSCOPy /STENT  EXCHANGE ureteral BALLOON DILATION (Left Ureter)  Patient Location: PACU  Anesthesia Type:General  Level of Consciousness: awake, alert  and patient cooperative  Airway & Oxygen Therapy: Patient Spontanous Breathing and Patient connected to face mask oxygen  Post-op Assessment: Report given to RN and Post -op Vital signs reviewed and stable  Post vital signs: Reviewed and stable  Last Vitals:  Vitals Value Taken Time  BP 139/78 06/23/20 1149  Temp    Pulse 82 06/23/20 1152  Resp 20 06/23/20 1152  SpO2 99 % 06/23/20 1152  Vitals shown include unvalidated device data.  Last Pain:  Vitals:   06/23/20 1025  TempSrc: Oral  PainSc: 0-No pain      Patients Stated Pain Goal: 3 (97/67/34 1937)  Complications: No complications documented.

## 2020-06-23 NOTE — Anesthesia Postprocedure Evaluation (Signed)
Anesthesia Post Note  Patient: Sarah [name]  Procedure(s) Performed: CYSTOSCOPY LEFT diagnostic /URETEROSCOPy /STENT  EXCHANGE ureteral BALLOON DILATION (Left Ureter)     Patient location during evaluation: PACU Anesthesia Type: General Level of consciousness: awake and alert Pain management: pain level controlled Vital Signs Assessment: post-procedure vital signs reviewed and stable Respiratory status: spontaneous breathing, nonlabored ventilation, respiratory function stable and patient connected to nasal cannula oxygen Cardiovascular status: blood pressure returned to baseline and stable Postop Assessment: no apparent nausea or vomiting Anesthetic complications: no   No complications documented.  Last Vitals:  Vitals:   06/23/20 1200 06/23/20 1215  BP: 133/70 132/63  Pulse: 80 76  Resp: 19 20  Temp:  36.8 C  SpO2: 100% 95%    Last Pain:  Vitals:   06/23/20 1215  TempSrc:   PainSc: 0-No pain                 Linet Brash L Cypress Hinkson

## 2020-06-23 NOTE — H&P (Signed)
CC/HPI: CC: Urinary incontinence, recurrent UTI  HPI:  02/26/2020  80 year old female has had 3 urinary tract infections in the past 3 months. These are E coli proven. It was resistant to ciprofloxacin, otherwise sensitive. This was back in November. She recently was treated with medication and is now off. She has no symptoms of urinary tract infection. Denies hematuria dysuria. She does have a prior urologist and just moved to the area. She states that she had intraurethral injections in the past for urinary incontinence. She now uses 2-3 pull-ups per day. This is not very bothersome to her. Not bothersome enough to consider medication management. She states that she does not really have leakage with cough, laugh, sneeze or heavy activity. Her leakage is more related to urgency. She has a lot of fatigue and she often is unable to make it to the toilet in time.   04/06/2020: Trimethoprim taken once nightly for bacterial prophylaxis prescribed at time of last office visit. Medication management for her baseline incontinence and other lower urinary tract symptoms was discussed but patient politely deferred. Returns this afternoon for UTI exacerbation. Current symptoms began about 3-4 days ago. She complains of increased frequency/urgency from baseline. Also having burning/painful urination. Her incontinence increased as well. Overall she feels poorly with increased fatigue and generalized body aches. She denies unilateral pain/discomfort suggestive of obstructive uropathy. She is continues to feel like she is emptying appropriately with each void. Symptoms not associated with gross hematuria. She denies documented fevers or chills. She is tolerating a normal diet without nausea or vomiting.   05/26/2020  Patient is status post left ureteral stent placement. She was hospitalized with another urinary tract infection. CT scan showed left-sided hydronephrosis with some surrounding lymphadenopathy versus chronic  UPJ obstruction. Renal scan was performed that confirmed obstruction with 23% function. For this reason she underwent the stent placement and presents for follow-up. She endorses feeling much improved.     ALLERGIES: atenolol Macrodantin    MEDICATIONS: Buspirone Hcl  Cephalexin 250 mg tablet 1 tablet PO Daily  Claritin  Cranberry  Effexor Xr  Flonase Allergy Relief  Glucosamine Chondroitin  Ibuprofen  Lansoprazole  Multivitamin  Potassium Chloride  Trimethoprim 100 mg tablet 1 tablet PO Q HS  Tylenol  Zetia     GU PSH: Cystoscopy Insert Stent, Left - 05/12/2020       PSH Notes: Kidney surgery   NON-GU PSH: Breast mastectomy, Bilateral Cholecystectomy (laparoscopic) Hysterectomy Tonsillectomy     GU PMH: Chronic cystitis (w/o hematuria) - 04/06/2020, - 02/26/2020 Urge incontinence - 02/26/2020    NON-GU PMH: Anxiety Breast Cancer, History Cardiac murmur, unspecified Depression DVT, History Hypercholesterolemia Hypertension Myocardial Infarction Skin Cancer, History    FAMILY HISTORY: 2 daughters - Other Colon Cancer - Mother Myocardial Infarction - Father   SOCIAL HISTORY: Marital Status: Widowed Preferred Language: English; Race: White Current Smoking Status: Patient has never smoked.   Tobacco Use Assessment Completed: Used Tobacco in last 30 days? Drinks 2 caffeinated drinks per day.    REVIEW OF SYSTEMS:    GU Review Female:   Patient denies frequent urination, hard to postpone urination, burning /pain with urination, get up at night to urinate, leakage of urine, stream starts and stops, trouble starting your stream, have to strain to urinate, and being pregnant.  Gastrointestinal (Upper):   Patient denies nausea, vomiting, and indigestion/ heartburn.  Gastrointestinal (Lower):   Patient denies diarrhea and constipation.  Constitutional:   Patient denies fever, night sweats, weight loss, and  fatigue.  Skin:   Patient denies skin rash/ lesion and  itching.  Eyes:   Patient denies blurred vision and double vision.  Ears/ Nose/ Throat:   Patient denies sore throat and sinus problems.  Hematologic/Lymphatic:   Patient denies swollen glands and easy bruising.  Cardiovascular:   Patient denies leg swelling and chest pains.  Respiratory:   Patient denies cough and shortness of breath.  Endocrine:   Patient denies excessive thirst.  Musculoskeletal:   Patient denies back pain and joint pain.  Neurological:   Patient denies headaches and dizziness.  Psychologic:   Patient denies depression and anxiety.   VITAL SIGNS:      05/26/2020 01:45 PM  BP 106/74 mmHg  Heart Rate 98 /min  Temperature 97.5 F / 36.3 C   Complexity of Data:  Source Of History:  Patient  Records Review:   Previous Patient Records  Urine Test Review:   Urinalysis   PROCEDURES:          Urinalysis w/Scope Dipstick Dipstick Cont'd Micro  Color: Red Bilirubin: Neg mg/dL WBC/hpf: 20 - 40/hpf  Appearance: Cloudy Ketones: Neg mg/dL RBC/hpf: >60/hpf  Specific Gravity: 1.020 Blood: 3+ ery/uL Bacteria: Few (10-25/hpf)  pH: 5.5 Protein: 2+ mg/dL Cystals: NS (Not Seen)  Glucose: Neg mg/dL Urobilinogen: 0.2 mg/dL Casts: NS (Not Seen)    Nitrites: Neg Trichomonas: Not Present    Leukocyte Esterase: 3+ leu/uL Mucous: Not Present      Epithelial Cells: 0 - 5/hpf      Yeast: Moderate (5 - 10/hpf)      Sperm: Not Present    Notes: microscopic not concentrated    ASSESSMENT:      ICD-10 Details  1 GU:   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 She is eager to proceed. Risks and benefits discussed. After completion of treatment antibiotics, she will start nightly Keflex prophylaxis.   Cc: Dr. Ethelene Hal        Next Appointment:      Next Appointment: 07/06/2020 11:00 AM    Appointment Type: Office Visit Established Patient    Location: [facility] [phone]    Provider: Jiles Crocker, NP    Reason for Visit: 3 mo OV / PVR     Signed by Link Snuffer, III, M.D. on 05/26/20 at 2:06 PM (EDT

## 2020-06-24 ENCOUNTER — Encounter (HOSPITAL_COMMUNITY): Payer: Self-pay | Admitting: Urology

## 2020-06-30 ENCOUNTER — Telehealth: Payer: Self-pay | Admitting: Family Medicine

## 2020-06-30 NOTE — Telephone Encounter (Signed)
[facility] is requesting verbal orders for patients home health orders to be extended for 2 more visits. He's also requesting patient continue with home health aide 1 time a week for 2 weeks. Please call him at [phone].

## 2020-06-30 NOTE — Telephone Encounter (Signed)
Sarah [name] aware that we have received forms and they are waiting to be viewed and signed by doctor. Will see if they can be signed tomorrow morning and faxed.

## 2020-06-30 NOTE — Telephone Encounter (Signed)
Sarah [name] from [facility] is calling about the request for Home Health orders.  She said the first request was 05/15/20. Please advise  [name]'s call back number is [phone]  Thank you

## 2020-07-02 ENCOUNTER — Telehealth: Payer: Self-pay | Admitting: Cardiology

## 2020-07-02 MED ORDER — EZETIMIBE 10 MG PO TABS: 10.0000 mg | ORAL_TABLET | Freq: Every day | ORAL | 3 refills | Status: DC

## 2020-07-02 NOTE — Telephone Encounter (Signed)
Refill sent in per request.  

## 2020-07-02 NOTE — Telephone Encounter (Signed)
*  STAT* If patient is at the pharmacy, call can be transferred to refill team.   1. Which medications need to be refilled? (please list name of each medication and dose if known)  ezetimibe (ZETIA) 10 MG tablet  2. Which pharmacy/location (including street and city if local pharmacy) is medication to be sent to? [id number] - [place], [place] - [address]  3. Do they need a 30 day or 90 day supply?  90 day supply  

## 2020-07-08 ENCOUNTER — Ambulatory Visit (HOSPITAL_COMMUNITY): Payer: Medicare Other | Attending: Cardiology

## 2020-07-08 ENCOUNTER — Other Ambulatory Visit: Payer: Self-pay

## 2020-07-08 DIAGNOSIS — R0600 Dyspnea, unspecified: Secondary | ICD-10-CM | POA: Diagnosis present

## 2020-07-08 DIAGNOSIS — R0609 Other forms of dyspnea: Secondary | ICD-10-CM

## 2020-07-08 LAB — ECHOCARDIOGRAM COMPLETE
Area-P 1/2: 3.08 cm2
S' Lateral: 2.9 cm

## 2020-07-09 ENCOUNTER — Telehealth: Payer: Self-pay | Admitting: Emergency Medicine

## 2020-07-09 NOTE — Telephone Encounter (Signed)
Called patient to give her results of echo.   During call she reports she has been having shortness of breath on exertion for the past month. She does have some swelling in lower extremities. No weight gain. She also states she has had some left sided chest pain. No radiation of pain. The pain comes on randomly. Advised her if it were to remain constant to go to the emergency room. She understood.  Will check with Dr. Agustin Cree for further recommendations.

## 2020-07-11 ENCOUNTER — Other Ambulatory Visit: Payer: Self-pay | Admitting: Family

## 2020-07-13 ENCOUNTER — Other Ambulatory Visit: Payer: Self-pay | Admitting: Cardiology

## 2020-07-14 ENCOUNTER — Encounter: Payer: Self-pay | Admitting: Family Medicine

## 2020-07-14 ENCOUNTER — Other Ambulatory Visit: Payer: Self-pay

## 2020-07-14 DIAGNOSIS — I252 Old myocardial infarction: Secondary | ICD-10-CM | POA: Insufficient documentation

## 2020-07-14 DIAGNOSIS — M1990 Unspecified osteoarthritis, unspecified site: Secondary | ICD-10-CM | POA: Insufficient documentation

## 2020-07-14 DIAGNOSIS — F419 Anxiety disorder, unspecified: Secondary | ICD-10-CM | POA: Insufficient documentation

## 2020-07-14 DIAGNOSIS — C8590 Non-Hodgkin lymphoma, unspecified, unspecified site: Secondary | ICD-10-CM | POA: Insufficient documentation

## 2020-07-14 DIAGNOSIS — R0600 Dyspnea, unspecified: Secondary | ICD-10-CM | POA: Insufficient documentation

## 2020-07-14 NOTE — Telephone Encounter (Signed)
PT is returning a phone call

## 2020-07-14 NOTE — Telephone Encounter (Signed)
Spoke to patient she is still having left sided mild chest pain along with fatigue and shortness of breath. She feels the same.

## 2020-07-14 NOTE — Telephone Encounter (Signed)
Spoke to patient daughter. She reports that the patient is still short of breath she will ask her about the pain she is getting her haircut right now.

## 2020-07-14 NOTE — Telephone Encounter (Signed)
Called and spoke to patient daughter per dpr. After discussing with Dr. Agustin Cree he wants them to be seen added her to scheduled tomorrow. Advised them if chest pain gets worse over night go to the emergency room.

## 2020-07-15 ENCOUNTER — Encounter: Payer: Self-pay | Admitting: Family Medicine

## 2020-07-15 ENCOUNTER — Ambulatory Visit (INDEPENDENT_AMBULATORY_CARE_PROVIDER_SITE_OTHER): Payer: Medicare Other | Admitting: Cardiology

## 2020-07-15 ENCOUNTER — Ambulatory Visit (HOSPITAL_BASED_OUTPATIENT_CLINIC_OR_DEPARTMENT_OTHER)
Admission: RE | Admit: 2020-07-15 | Discharge: 2020-07-15 | Disposition: A | Discharge status: Home / Self Care | Payer: Medicare Other | Source: Ambulatory Visit | Attending: Cardiology | Admitting: Cardiology

## 2020-07-15 ENCOUNTER — Other Ambulatory Visit: Payer: Self-pay

## 2020-07-15 VITALS — BP 100/68 | HR 86 | Ht 63.0 in | Wt 159.0 lb

## 2020-07-15 DIAGNOSIS — I252 Old myocardial infarction: Secondary | ICD-10-CM | POA: Diagnosis present

## 2020-07-15 DIAGNOSIS — C9110 Chronic lymphocytic leukemia of B-cell type not having achieved remission: Secondary | ICD-10-CM | POA: Insufficient documentation

## 2020-07-15 DIAGNOSIS — R0609 Other forms of dyspnea: Secondary | ICD-10-CM

## 2020-07-15 DIAGNOSIS — R0600 Dyspnea, unspecified: Secondary | ICD-10-CM | POA: Insufficient documentation

## 2020-07-15 DIAGNOSIS — R5383 Other fatigue: Secondary | ICD-10-CM | POA: Insufficient documentation

## 2020-07-15 DIAGNOSIS — I2510 Atherosclerotic heart disease of native coronary artery without angina pectoris: Secondary | ICD-10-CM

## 2020-07-15 MED ORDER — BUSPIRONE HCL 15 MG PO TABS: 15.0000 mg | ORAL_TABLET | Freq: Two times a day (BID) | ORAL | 1 refills | Status: DC

## 2020-07-15 MED ORDER — LEVOTHYROXINE SODIUM 25 MCG PO TABS
25.0000 ug | ORAL_TABLET | Freq: Every day | ORAL | 0 refills | Status: DC
Start: 2020-07-15 — End: 2020-09-13

## 2020-07-15 NOTE — Addendum Note (Signed)
Addended by: Senaida Ores on: 07/15/2020 10:53 AM   Modules accepted: Orders

## 2020-07-15 NOTE — Addendum Note (Signed)
Addended by: Jon Billings on: 07/15/2020 12:32 PM   Modules accepted: Orders

## 2020-07-15 NOTE — Patient Instructions (Signed)
Medication Instructions:  Your physician recommends that you continue on your current medications as directed. Please refer to the Current Medication list given to you today.  *If you need a refill on your cardiac medications before your next appointment, please call your pharmacy*   Lab Work: Your physician recommends that you return for lab work today: pro bnp, digoxin level, cbc   If you have labs (blood work) drawn today and your tests are completely normal, you will receive your results only by: Marland Kitchen MyChart Message (if you have MyChart) OR . A paper copy in the mail If you have any lab test that is abnormal or we need to change your treatment, we will call you to review the results.   Testing/Procedures: A chest x-ray takes a picture of the organs and structures inside the chest, including the heart, lungs, and blood vessels. This test can show several things, including, whether the heart is enlarges; whether fluid is building up in the lungs; and whether pacemaker / defibrillator leads are still in place.    Follow-Up: At [facility], you and your health needs are our priority.  As part of our continuing mission to provide you with exceptional heart care, we have created designated Provider Care Teams.  These Care Teams include your primary Cardiologist (physician) and Advanced Practice Providers (APPs -  Physician Assistants and Nurse Practitioners) who all work together to provide you with the care you need, when you need it.  We recommend signing up for the patient portal called "MyChart".  Sign up information is provided on this After Visit Summary.  MyChart is used to connect with patients for Virtual Visits (Telemedicine).  Patients are able to view lab/test results, encounter notes, upcoming appointments, etc.  Non-urgent messages can be sent to your provider as well.   To learn more about what you can do with MyChart, go to [email or web address].    Your next appointment:    2 month(s)  The format for your next appointment:   In Person  Provider:   Jenne Campus, MD   Other Instructions

## 2020-07-15 NOTE — Progress Notes (Signed)
Cardiology Office Note:    Date:  07/15/2020   ID:  Sarah [name], DOB September 24, 1940, MRN [id number]  PCP:  Libby Maw, MD  Cardiologist:  Jenne Campus, MD    Referring MD: Libby Maw,*   Chief Complaint  Patient presents with  . Shortness of Breath  . Chest Pain  . Leg Swelling    Ankles and feet    History of Present Illness:    Sarah [name] is a 80 y.o. female who was referred originally to [place] for evaluation before urological procedure.  Apparently 1999 she suffered from myocardial infarction however her echocardiogram done just recently showed preserved left ventricle ejection fraction without segmental motion abnormalities.  At that time she was also complaining of having palpitations.  We did monitor which showed some evidence of supraventricular tachycardia but no worrisome arrhythmia.  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  She comes today 2 months of follow-up.  She says she is not feeling well.  The main complaint is fatigue tiredness and shortness of breath that she gets.  She also complained of having some swelling of lower extremities.  There is no proximal nocturnal dyspnea.  There is no chest pain tightness squeezing pressure burning chest.  Past Medical History:  Diagnosis Date  . Acute cystitis with hematuria 12/15/2019  . AKI (acute kidney injury) ([place]) 05/06/2020  . Allergic rhinitis due to pollen 12/08/2019  . Allergy   . Anxiety   . Arthritis   . CLL (chronic lymphocytic leukemia) ([place])   . Coronary artery disease 06/09/2020  . Dyspnea    continuos  . Elevated cholesterol 12/08/2019  . Elevated TSH 06/10/2020  . Fatigue 06/10/2020  . GERD (gastroesophageal reflux disease)   . Gout   . History of gout 12/08/2019  . History of heart attack   . History of MI (myocardial infarction) 12/08/2019  . History of myocardial infarction 12/08/2019  . HLD (hyperlipidemia)   . Hospital discharge follow-up 05/25/2020  . Hypertension   .  Hypokalemia 05/25/2020  . Hypotension 05/06/2020  . 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 Non Hodgkin's lymphoma ([place])   . Other general symptoms and signs  06/10/2020  . Urinary frequency 12/08/2019  . UTI (urinary tract infection) 05/06/2020    Past Surgical History:  Procedure Laterality Date  . ABDOMINAL HYSTERECTOMY    . APPENDECTOMY    . CHOLECYSTECTOMY    . CYSTOSCOPY WITH STENT PLACEMENT Left 05/12/2020   Procedure: CYSTOSCOPY WITH STENT PLACEMENT;  Surgeon: Lucas Mallow, MD;  Location: [facility];  Service: Urology;  Laterality: Left;  . CYSTOSCOPY/URETEROSCOPY/HOLMIUM LASER/STENT PLACEMENT Left 06/23/2020   Procedure: CYSTOSCOPY LEFT diagnostic /URETEROSCOPy /STENT  EXCHANGE ureteral BALLOON DILATION;  Surgeon: Lucas Mallow, MD;  Location: [facility];  Service: Urology;  Laterality: Left;  REQUESTING 1 HR  . Double masectomy    . KIDNEY SURGERY    . MOHS SURGERY    . TONSILECTOMY, ADENOIDECTOMY, BILATERAL MYRINGOTOMY AND TUBES      Current Medications: Current Meds  Medication Sig  . acetaminophen (TYLENOL) 325 MG tablet Take 2 tablets (650 mg total) by mouth every 6 (six) hours as needed for mild pain (or Fever >/= 101).  . busPIRone (BUSPAR) 15 MG tablet Take 1 tablet (15 mg total) by mouth 2 (two) times daily.  Marland Kitchen CRANBERRY EXTRACT PO Take 1 tablet by mouth as needed (UTI). Unknown strength  . digoxin (LANOXIN) 0.125 MG tablet TAKE 1 TABLET BY MOUTH DAILY (Patient taking differently:  Take 0.125 mg by mouth daily.)  . ezetimibe (ZETIA) 10 MG tablet Take 1 tablet (10 mg total) by mouth daily.  . fluticasone (FLONASE) 50 MCG/ACT nasal spray Place 2 sprays into both nostrils daily. (Patient taking differently: Place 2 sprays into both nostrils as needed for allergies.)  . guaifenesin (HUMIBID E) 400 MG TABS tablet Take 400 mg by mouth every 6 (six) hours as needed (allergies/ mucus).  Marland Kitchen HYDROcodone-acetaminophen (NORCO) 5-325 MG tablet Take 1 tablet by mouth every 4 (four) hours as  needed for moderate pain.  Marland Kitchen lansoprazole (PREVACID) 30 MG capsule Take 30 mg by mouth daily.  Marland Kitchen levothyroxine (SYNTHROID) 50 MCG tablet Take 1 tablet (50 mcg total) by mouth daily. 30 minutes prior to eating.  Marland Kitchen loratadine (CLARITIN) 10 MG tablet Take 10 mg by mouth daily as needed for allergies.  . Multiple Vitamins-Minerals (EQ MULTIVITAMINS ADULT GUMMY) CHEW Chew 1 tablet by mouth in the morning. Unknown strength  . potassium chloride (KLOR-CON) 10 MEQ tablet Take 10 mEq by mouth daily.  Marland Kitchen saccharomyces boulardii (FLORASTOR) 250 MG capsule Take 30 mg by mouth daily.  Marland Kitchen trimethoprim (TRIMPEX) 100 MG tablet Take 100 mg by mouth daily.  Marland Kitchen venlafaxine XR (EFFEXOR-XR) 150 MG 24 hr capsule Take 150 mg by mouth daily with breakfast.     Allergies:   Amoxicillin, Augmentin [amoxicillin-pot clavulanate], Drug [tape], Sulfa antibiotics, Atenolol, and Macrodantin [nitrofurantoin]   Social History   Socioeconomic History  . Marital status: Widowed    Spouse name: Not on file  . Number of children: Not on file  . Years of education: Not on file  . Highest education level: Not on file  Occupational History  . Not on file  Tobacco Use  . Smoking status: Never Smoker  . Smokeless tobacco: Never Used  Vaping Use  . Vaping Use: Never used  Substance and Sexual Activity  . Alcohol use: Not Currently    Comment: Rare social drinking  . Drug use: Never  . Sexual activity: Not Currently  Other Topics Concern  . Not on file  Social History Narrative  . Not on file   Social Determinants of Health   Financial Resource Strain: Low Risk   . Difficulty of Paying Living Expenses: Not hard at all  Food Insecurity: No Food Insecurity  . Worried About Charity fundraiser in the Last Year: Never true  . Ran Out of Food in the Last Year: Never true  Transportation Needs: No Transportation Needs  . Lack of Transportation (Medical): No  . Lack of Transportation (Non-Medical): No  Physical Activity:  Inactive  . Days of Exercise per Week: 0 days  . Minutes of Exercise per Session: 0 min  Stress: No Stress Concern Present  . Feeling of Stress : Not at all  Social Connections: Moderately Isolated  . Frequency of Communication with Friends and Family: More than three times a week  . Frequency of Social Gatherings with Friends and Family: Once a week  . Attends Religious Services: 1 to 4 times per year  . Active Member of Clubs or Organizations: No  . Attends Archivist Meetings: Never  . Marital Status: Widowed     Family History: The patient's family history includes Colon cancer in her mother; Heart attack in her father. ROS:   Please see the history of present illness.    All 14 point review of systems negative except as described per history of present illness  EKGs/Labs/Other Studies Reviewed:  Recent Labs: 05/14/2020: B Natriuretic Peptide 100.4 05/20/2020: Magnesium 1.8 05/25/2020: ALT 12 06/10/2020: TSH 5.98 06/14/2020: Hemoglobin 9.9; Platelets 265 06/23/2020: BUN 18; Creatinine, Ser 0.98; Potassium 4.3; Sodium 140  Recent Lipid Panel No results found for: CHOL, TRIG, HDL, CHOLHDL, VLDL, LDLCALC, LDLDIRECT  Physical Exam:    VS:  BP 100/68 (BP Location: Right Arm, Patient Position: Sitting)   Pulse 86   Ht 5\' 3"  (1.6 m)   Wt 159 lb (72.1 kg)   SpO2 98%   BMI 28.17 kg/m     Wt Readings from Last 3 Encounters:  07/15/20 159 lb (72.1 kg)  06/23/20 155 lb (70.3 kg)  06/14/20 155 lb (70.3 kg)     GEN:  Well nourished, well developed in no acute distress HEENT: Normal NECK: No JVD; No carotid bruits LYMPHATICS: No lymphadenopathy CARDIAC: RRR, no murmurs, no rubs, no gallops RESPIRATORY:  Clear to auscultation without rales, wheezing or rhonchi  ABDOMEN: Soft, non-tender, non-distended MUSCULOSKELETAL:  No edema; No deformity  SKIN: Warm and dry LOWER EXTREMITIES: Nonpitting edema noted NEUROLOGIC:  Alert and oriented x 3 PSYCHIATRIC:  Normal  affect   ASSESSMENT:    1. Coronary artery disease involving native coronary artery of native heart without angina pectoris   2. History of MI (myocardial infarction)   3. Fatigue, unspecified type   4. Dyspnea on exertion   5. CLL (chronic lymphocytic leukemia) (HCC)    PLAN:    In order of problems listed above:  1. Coronary disease stable from that point review.  Stress echocardiogram showed preserved left ventricle ejection fraction.  I am more about her symptomatology and concerns about possibility of having angina equivalent. 2. History of myocardial infarction however her latest echocardiogram showed preserved left ventricle ejection fraction without segmental wall motion abnormalities. 3. Dyspnea exertion probably multifactorial.  I do see some nonpitting edema in lower extremities, but I do not see any JVD even while she is laying flat, her lungs are clear.  I will ask her to have proBNP today to look for any subclinical congestive heart failure.  I will also ask her to have a chest x-ray today.  In the future we may be forced to stress test to evaluate for reactivation of coronary artery disease. 4. 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  I put her last time on digoxin since she does have low blood pressure to begin with.  She is already on beta-blocker which is small dose which I will continue for now. 5. CLL apparently stable. 6. She may require pulmonary evaluation for her shortness of breath.  If her proBNP will be normal we may consider a stress test to rule out ischemia and angina equivalent   Medication Adjustments/Labs and Tests Ordered: Current medicines are reviewed at length with the patient today.  Concerns regarding medicines are outlined above.  No orders of the defined types were placed in this encounter.  Medication changes: No orders of the defined types were placed in this encounter.   Signed, Park Liter, MD, [facility] 07/15/2020 10:45 AM     [place]

## 2020-07-15 NOTE — Telephone Encounter (Signed)
Refill request for pending Rx, last OV 06/10/20 last refill November 2021. Please advise.

## 2020-07-16 ENCOUNTER — Telehealth: Payer: Self-pay | Admitting: Cardiology

## 2020-07-16 NOTE — Telephone Encounter (Signed)
[facility] call with labs results. please advise

## 2020-07-16 NOTE — Telephone Encounter (Signed)
Called [facility]. They report WBC count of 58.8 that was drew on 07/14/20. Will inform Dr. Agustin Cree.

## 2020-07-17 LAB — CBC
Hematocrit: 36.1 % (ref 34.0–46.6)
Hemoglobin: 11.3 g/dL (ref 11.1–15.9)
MCH: 31.9 pg (ref 26.6–33.0)
MCHC: 31.3 g/dL — ABNORMAL LOW (ref 31.5–35.7)
MCV: 102 fL — ABNORMAL HIGH (ref 79–97)
Platelets: 184 10*3/uL (ref 150–450)
RBC: 3.54 x10E6/uL — ABNORMAL LOW (ref 3.77–5.28)
RDW: 16.7 % — ABNORMAL HIGH (ref 11.7–15.4)
WBC: 58.8 10*3/uL (ref 3.4–10.8)

## 2020-07-17 LAB — DIGOXIN LEVEL: Digoxin, Serum: 1.3 ng/mL — ABNORMAL HIGH (ref 0.5–0.9)

## 2020-07-17 LAB — PRO B NATRIURETIC PEPTIDE: NT-Pro BNP: 558 pg/mL (ref 0–738)

## 2020-07-18 DIAGNOSIS — R413 Other amnesia: Secondary | ICD-10-CM | POA: Insufficient documentation

## 2020-07-19 DIAGNOSIS — H539 Unspecified visual disturbance: Secondary | ICD-10-CM | POA: Insufficient documentation

## 2020-07-19 DIAGNOSIS — R52 Pain, unspecified: Secondary | ICD-10-CM | POA: Insufficient documentation

## 2020-07-20 MED ORDER — DIGOXIN 125 MCG PO TABS: 125.0000 ug | ORAL_TABLET | ORAL | 1 refills | Status: DC

## 2020-07-20 NOTE — Telephone Encounter (Signed)
Spoke to patient daughter per dpr. Informed her of results she will have patient decrease digoxin down to once every other day, cc'd results to oncologist and pcp. No further questions.

## 2020-07-26 DIAGNOSIS — I10 Essential (primary) hypertension: Secondary | ICD-10-CM | POA: Insufficient documentation

## 2020-07-28 ENCOUNTER — Ambulatory Visit (INDEPENDENT_AMBULATORY_CARE_PROVIDER_SITE_OTHER): Payer: Medicare Other | Admitting: Cardiology

## 2020-07-28 ENCOUNTER — Encounter: Payer: Self-pay | Admitting: Cardiology

## 2020-07-28 ENCOUNTER — Other Ambulatory Visit: Payer: Self-pay

## 2020-07-28 VITALS — BP 108/62 | HR 83 | Ht 63.0 in | Wt 158.0 lb

## 2020-07-28 DIAGNOSIS — C9110 Chronic lymphocytic leukemia of B-cell type not having achieved remission: Secondary | ICD-10-CM

## 2020-07-28 DIAGNOSIS — I252 Old myocardial infarction: Secondary | ICD-10-CM | POA: Diagnosis not present

## 2020-07-28 DIAGNOSIS — I2510 Atherosclerotic heart disease of native coronary artery without angina pectoris: Secondary | ICD-10-CM

## 2020-07-28 DIAGNOSIS — I10 Essential (primary) hypertension: Secondary | ICD-10-CM | POA: Diagnosis not present

## 2020-07-28 NOTE — Progress Notes (Signed)
Cardiology Office Note:    Date:  07/28/2020   ID:  Sarah [name], DOB 1940-09-14, MRN [id number]  PCP:  Libby Maw, MD  Cardiologist:  Jenne Campus, MD    Referring MD: Libby Maw,*   Chief Complaint  Patient presents with  . Shortness of Breath  . Fatigue  . back and leg pain     Since on digoxin    History of Present Illness:    Sarah [name] is a 80 y.o. female she was referred to [place] originally before urological for procedure for cardiac evaluation apparently 1999 she did suffer from myocardial infarction however her echocardiogram done recently showed preserved left ventricle ejection fraction.  She also wears a monitor which showed supraventricular tachycardia which I try to manage with digoxin, however, she appears to have some side effects of this medication I will to stop this medication today.  She been complaining of feeling poorly weak tired exhausted having some sweats she also sweats at night.  So far cardiac testing did not explain her symptomatology.  She did have proBNP done which was still within reasonably low level, again I do not see cardiorespiratory symptomatology.  When I did routine blood test I did notice her white blood cell count being more than 55,000.  I think she can benefit from seeing oncologist/hematologist pretty soon.  Past Medical History:  Diagnosis Date  . Acute cystitis with hematuria 12/15/2019  . AKI (acute kidney injury) ([place]) 05/06/2020  . Allergic rhinitis due to pollen 12/08/2019  . Allergy   . Anxiety   . Arthritis   . CLL (chronic lymphocytic leukemia) ([place])   . Coronary artery disease 06/09/2020  . Dyspnea    continuos  . Elevated cholesterol 12/08/2019  . Elevated TSH 06/10/2020  . Fatigue 06/10/2020  . GERD (gastroesophageal reflux disease)   . Gout   . History of gout 12/08/2019  . History of heart attack   . History of MI (myocardial infarction) 12/08/2019  . History of myocardial infarction  12/08/2019  . HLD (hyperlipidemia)   . Hospital discharge follow-up 05/25/2020  . Hypertension   . Hypokalemia 05/25/2020  . Hypotension 05/06/2020  . 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 Non Hodgkin's lymphoma ([place])   . Other general symptoms and signs  06/10/2020  . Urinary frequency 12/08/2019  . UTI (urinary tract infection) 05/06/2020    Past Surgical History:  Procedure Laterality Date  . ABDOMINAL HYSTERECTOMY    . APPENDECTOMY    . CHOLECYSTECTOMY    . CYSTOSCOPY WITH STENT PLACEMENT Left 05/12/2020   Procedure: CYSTOSCOPY WITH STENT PLACEMENT;  Surgeon: Lucas Mallow, MD;  Location: [facility];  Service: Urology;  Laterality: Left;  . CYSTOSCOPY/URETEROSCOPY/HOLMIUM LASER/STENT PLACEMENT Left 06/23/2020   Procedure: CYSTOSCOPY LEFT diagnostic /URETEROSCOPy /STENT  EXCHANGE ureteral BALLOON DILATION;  Surgeon: Lucas Mallow, MD;  [facility];  Service: Urology;  Laterality: Left;  REQUESTING 1 HR  . Double masectomy    . KIDNEY SURGERY    . MOHS SURGERY    . TONSILECTOMY, ADENOIDECTOMY, BILATERAL MYRINGOTOMY AND TUBES      Current Medications: Current Meds  Medication Sig  . acetaminophen (TYLENOL) 325 MG tablet Take 2 tablets (650 mg total) by mouth every 6 (six) hours as needed for mild pain (or Fever >/= 101).  . busPIRone (BUSPAR) 15 MG tablet Take 1 tablet (15 mg total) by mouth 2 (two) times daily.  Marland Kitchen CRANBERRY EXTRACT PO Take 1 tablet by mouth as needed (  UTI). Unknown strength  . digoxin (LANOXIN) 0.125 MG tablet Take 1 tablet (125 mcg total) by mouth every other day.  . ezetimibe (ZETIA) 10 MG tablet Take 1 tablet (10 mg total) by mouth daily.  . fluticasone (FLONASE) 50 MCG/ACT nasal spray Place 2 sprays into both nostrils daily. (Patient taking differently: Place 2 sprays into both nostrils as needed for allergies.)  . guaifenesin (HUMIBID E) 400 MG TABS tablet Take 400 mg by mouth every 6 (six) hours as needed (allergies/ mucus).  Marland Kitchen HYDROcodone-acetaminophen  (NORCO) 5-325 MG tablet Take 1 tablet by mouth every 4 (four) hours as needed for moderate pain.  Marland Kitchen lansoprazole (PREVACID) 30 MG capsule Take 30 mg by mouth daily.  Marland Kitchen levothyroxine (SYNTHROID) 25 MCG tablet Take 1 tablet (25 mcg total) by mouth daily.  Marland Kitchen loratadine (CLARITIN) 10 MG tablet Take 10 mg by mouth daily as needed for allergies.  . Multiple Vitamins-Minerals (EQ MULTIVITAMINS ADULT GUMMY) CHEW Chew 1 tablet by mouth in the morning. Unknown strength  . potassium chloride (KLOR-CON) 10 MEQ tablet Take 10 mEq by mouth daily.  Marland Kitchen saccharomyces boulardii (FLORASTOR) 250 MG capsule Take 30 mg by mouth daily.  Marland Kitchen trimethoprim (TRIMPEX) 100 MG tablet Take 100 mg by mouth daily.  Marland Kitchen venlafaxine XR (EFFEXOR-XR) 150 MG 24 hr capsule Take 150 mg by mouth daily with breakfast.     Allergies:   Amoxicillin, Augmentin [amoxicillin-pot clavulanate], Drug [tape], Sulfa antibiotics, Atenolol, and Macrodantin [nitrofurantoin]   Social History   Socioeconomic History  . Marital status: Widowed    Spouse name: Not on file  . Number of children: Not on file  . Years of education: Not on file  . Highest education level: Not on file  Occupational History  . Not on file  Tobacco Use  . Smoking status: Never Smoker  . Smokeless tobacco: Never Used  Vaping Use  . Vaping Use: Never used  Substance and Sexual Activity  . Alcohol use: Not Currently    Comment: Rare social drinking  . Drug use: Never  . Sexual activity: Not Currently  Other Topics Concern  . Not on file  Social History Narrative  . Not on file   Social Determinants of Health   Financial Resource Strain: Low Risk   . Difficulty of Paying Living Expenses: Not hard at all  Food Insecurity: No Food Insecurity  . Worried About Charity fundraiser in the Last Year: Never true  . Ran Out of Food in the Last Year: Never true  Transportation Needs: No Transportation Needs  . Lack of Transportation (Medical): No  . Lack of  Transportation (Non-Medical): No  Physical Activity: Inactive  . Days of Exercise per Week: 0 days  . Minutes of Exercise per Session: 0 min  Stress: No Stress Concern Present  . Feeling of Stress : Not at all  Social Connections: Moderately Isolated  . Frequency of Communication with Friends and Family: More than three times a week  . Frequency of Social Gatherings with Friends and Family: Once a week  . Attends Religious Services: 1 to 4 times per year  . Active Member of Clubs or Organizations: No  . Attends Archivist Meetings: Never  . Marital Status: Widowed     Family History: The patient's family history includes Colon cancer in her mother; Heart attack in her father. ROS:   Please see the history of present illness.    All 14 point review of systems negative except as  described per history of present illness  EKGs/Labs/Other Studies Reviewed:      Recent Labs: 05/14/2020: B Natriuretic Peptide 100.4 05/20/2020: Magnesium 1.8 05/25/2020: ALT 12 06/10/2020: TSH 5.98 06/23/2020: BUN 18; Creatinine, Ser 0.98; Potassium 4.3; Sodium 140 07/15/2020: Hemoglobin 11.3; NT-Pro BNP 558; Platelets 184  Recent Lipid Panel No results found for: CHOL, TRIG, HDL, CHOLHDL, VLDL, LDLCALC, LDLDIRECT  Physical Exam:    VS:  BP 108/62 (BP Location: Right Arm, Patient Position: Sitting)   Pulse 83   Ht 5\' 3"  (1.6 m)   Wt 158 lb (71.7 kg)   SpO2 95%   BMI 27.99 kg/m     Wt Readings from Last 3 Encounters:  07/28/20 158 lb (71.7 kg)  07/15/20 159 lb (72.1 kg)  06/23/20 155 lb (70.3 kg)     GEN:  Well nourished, well developed in no acute distress HEENT: Normal NECK: No JVD; No carotid bruits LYMPHATICS: No lymphadenopathy CARDIAC: RRR, no murmurs, no rubs, no gallops RESPIRATORY:  Clear to auscultation without rales, wheezing or rhonchi  ABDOMEN: Soft, non-tender, non-distended MUSCULOSKELETAL:  No edema; No deformity  SKIN: Warm and dry LOWER EXTREMITIES: no  swelling NEUROLOGIC:  Alert and oriented x 3 PSYCHIATRIC:  Normal affect   ASSESSMENT:    1. Coronary artery disease involving native coronary artery of native heart without angina pectoris   2. CLL (chronic lymphocytic leukemia) ([place])   3. History of MI (myocardial infarction)   4. Primary hypertension    PLAN:    In order of problems listed above:  1. Coronary artery disease history of myocardial infarction in the past but echocardiogram showing preserved left ventricle ejection fraction.  She is on appropriate medication I will not change any of her medication right now.  My goal was to schedule her to have a stress test but since she simply does not look good in my opinion to do it right now and I doubt her symptomatology is related to potential angina equivalent.  I think the best course of action will be to refer her back to hematology as well as urology.  If that evaluation will be negative then I will pursue cardiac work-up. 2. CLL with latest 1 was 7 of 58,000 which of course may be very worried I will forward messages to oncology team to see her as quickly as possible. 3. History of MI but echocardiogram showed preserved left ventricle ejection fraction, 4. Essential hypertension blood pressure on the lower side. 5. Dyslipidemia she is on Zetia which I will continue for now.   Medication Adjustments/Labs and Tests Ordered: Current medicines are reviewed at length with the patient today.  Concerns regarding medicines are outlined above.  No orders of the defined types were placed in this encounter.  Medication changes: No orders of the defined types were placed in this encounter.   Signed, Park Liter, MD, [facility] 07/28/2020 2:03 PM    [place]

## 2020-07-28 NOTE — Addendum Note (Signed)
Addended by: Darrel Reach on: 07/28/2020 02:22 PM   Modules accepted: Orders

## 2020-07-28 NOTE — Patient Instructions (Signed)
Medication Instructions:  Your physician has recommended you make the following change in your medication: Stop digoxin immediately  *If you need a refill on your cardiac medications before your next appointment, please call your pharmacy*   Lab Work:  If you have labs (blood work) drawn today and your tests are completely normal, you will receive your results only by: Marland Kitchen MyChart Message (if you have MyChart) OR . A paper copy in the mail If you have any lab test that is abnormal or we need to change your treatment, we will call you to review the results.   Testing/Procedures:    Follow-Up: At [facility], you and your health needs are our priority.  As part of our continuing mission to provide you with exceptional heart care, we have created designated Provider Care Teams.  These Care Teams include your primary Cardiologist (physician) and Advanced Practice Providers (APPs -  Physician Assistants and Nurse Practitioners) who all work together to provide you with the care you need, when you need it.  We recommend signing up for the patient portal called "MyChart".  Sign up information is provided on this After Visit Summary.  MyChart is used to connect with patients for Virtual Visits (Telemedicine).  Patients are able to view lab/test results, encounter notes, upcoming appointments, etc.  Non-urgent messages can be sent to your provider as well.   To learn more about what you can do with MyChart, go to [email or web address].    Your next appointment:   2 month(s)  The format for your next appointment:   In Person  Provider:   Jenne Campus, MD   Other Instructions Follow up with your hemotologist sooner per Dr. Agustin Cree

## 2020-07-29 ENCOUNTER — Other Ambulatory Visit: Payer: Self-pay | Admitting: Family Medicine

## 2020-07-29 ENCOUNTER — Encounter: Payer: Self-pay | Admitting: Family Medicine

## 2020-07-29 ENCOUNTER — Other Ambulatory Visit: Payer: Self-pay

## 2020-07-29 ENCOUNTER — Telehealth: Payer: Self-pay

## 2020-07-29 MED ORDER — LANSOPRAZOLE 30 MG PO CPDR: 30.0000 mg | DELAYED_RELEASE_CAPSULE | Freq: Every day | ORAL | 0 refills | Status: DC

## 2020-07-29 NOTE — Telephone Encounter (Signed)
Phone call placed to check in on patient by [facility] volunteer. No answer.

## 2020-07-30 ENCOUNTER — Ambulatory Visit: Payer: Medicare Other | Admitting: Hematology and Oncology

## 2020-07-30 ENCOUNTER — Other Ambulatory Visit: Payer: Medicare Other

## 2020-08-02 ENCOUNTER — Encounter: Payer: Self-pay | Admitting: Family Medicine

## 2020-08-02 ENCOUNTER — Other Ambulatory Visit: Payer: Self-pay | Admitting: Family Medicine

## 2020-08-04 ENCOUNTER — Encounter: Payer: Self-pay | Admitting: Family Medicine

## 2020-08-10 ENCOUNTER — Other Ambulatory Visit (HOSPITAL_COMMUNITY): Payer: Self-pay | Admitting: Urology

## 2020-08-10 ENCOUNTER — Other Ambulatory Visit: Payer: Self-pay | Admitting: Urology

## 2020-08-10 DIAGNOSIS — N130 Hydronephrosis with ureteropelvic junction obstruction: Secondary | ICD-10-CM

## 2020-08-11 ENCOUNTER — Other Ambulatory Visit: Payer: Self-pay

## 2020-08-11 ENCOUNTER — Ambulatory Visit (INDEPENDENT_AMBULATORY_CARE_PROVIDER_SITE_OTHER): Payer: Medicare Other | Admitting: Family Medicine

## 2020-08-11 ENCOUNTER — Encounter: Payer: Self-pay | Admitting: Family Medicine

## 2020-08-11 VITALS — BP 118/72 | HR 86 | Temp 97.3°F | Ht 63.0 in | Wt 161.2 lb

## 2020-08-11 DIAGNOSIS — L989 Disorder of the skin and subcutaneous tissue, unspecified: Secondary | ICD-10-CM | POA: Diagnosis not present

## 2020-08-11 DIAGNOSIS — R509 Fever, unspecified: Secondary | ICD-10-CM | POA: Diagnosis not present

## 2020-08-11 DIAGNOSIS — J301 Allergic rhinitis due to pollen: Secondary | ICD-10-CM

## 2020-08-11 DIAGNOSIS — R1011 Right upper quadrant pain: Secondary | ICD-10-CM | POA: Diagnosis not present

## 2020-08-11 LAB — URINALYSIS, ROUTINE W REFLEX MICROSCOPIC
Bilirubin Urine: NEGATIVE
Ketones, ur: NEGATIVE
Nitrite: NEGATIVE
Specific Gravity, Urine: 1.025 (ref 1.000–1.030)
Total Protein, Urine: 30 — AB
Urine Glucose: NEGATIVE
Urobilinogen, UA: 0.2 (ref 0.0–1.0)
pH: 6 (ref 5.0–8.0)

## 2020-08-11 LAB — BASIC METABOLIC PANEL
BUN: 17 mg/dL (ref 6–23)
CO2: 24 mEq/L (ref 19–32)
Calcium: 9.3 mg/dL (ref 8.4–10.5)
Chloride: 101 mEq/L (ref 96–112)
Creatinine, Ser: 1.25 mg/dL — ABNORMAL HIGH (ref 0.40–1.20)
GFR: 40.66 mL/min — ABNORMAL LOW (ref >60.00)
Glucose, Bld: 72 mg/dL (ref 70–99)
Potassium: 4 mEq/L (ref 3.5–5.1)
Sodium: 139 mEq/L (ref 135–145)

## 2020-08-11 LAB — AMYLASE: Amylase: 26 U/L — ABNORMAL LOW (ref 27–131)

## 2020-08-11 LAB — HEPATIC FUNCTION PANEL
ALT: 16 U/L (ref 0–35)
AST: 20 U/L (ref 0–37)
Albumin: 4.3 g/dL (ref 3.5–5.2)
Alkaline Phosphatase: 133 U/L — ABNORMAL HIGH (ref 39–117)
Bilirubin, Direct: 0.2 mg/dL (ref 0.0–0.3)
Total Bilirubin: 0.9 mg/dL (ref 0.2–1.2)
Total Protein: 6 g/dL (ref 6.0–8.3)

## 2020-08-11 NOTE — Progress Notes (Signed)
Established Patient Office Visit  Subjective:  Patient ID: Sarah [name], female    DOB: 30-Sep-1940  Age: 80 y.o. MRN: [id number]  CC:  Chief Complaint  Patient presents with   Follow-up    Follow up on UTI C/O low grade temps with some dysuria. Would like spot on back checked tender to touch.     HPI [name] presents for evaluation of mild elevation in temperature over the last week or so.  Temperature has been measured as high as 99.  It was measured 99 this morning.  She took nothing for it.  She has ongoing nasal congestion clear rhinorrhea and postnasal drip.  There are occasional morning headaches that resolved.  History of allergy rhinitis.  She has been using Flonase on a as needed basis.  There is mild burning with urination.  Urine has been thick.  Admits that she could be hydrating more.  Ongoing nature of leukocytosis associated with her current blood dyscrasia.  Follow-up is planned with hematology next week.  She has had some mild right upper quadrant pain.  It bothers her at times when she reaches.  Has movement through her chest wall.  Ongoing mild nausea with decreased appetite.  She is stooling normally without diarrhea or blood or pus in the stool.  Stent was removed from her ureter a few weeks ago.  She has had a lesion on her mid back area over the last year or so that seems to be changing.  Past Medical History:  Diagnosis Date   Acute cystitis with hematuria 12/15/2019   AKI (acute kidney injury) ([place]) 05/06/2020   Allergic rhinitis due to pollen 12/08/2019   Allergy    Anxiety    Arthritis    CLL (chronic lymphocytic leukemia) ([facility])    Coronary artery disease 06/09/2020   Dyspnea    continuos   Elevated cholesterol 12/08/2019   Elevated TSH 06/10/2020   Fatigue 06/10/2020   GERD (gastroesophageal reflux disease)    Gout    History of gout 12/08/2019   History of heart attack    History of MI (myocardial infarction) 12/08/2019   History of myocardial  infarction 12/08/2019   HLD (hyperlipidemia)    Hospital discharge follow-up 05/25/2020   Hypertension    Hypokalemia 05/25/2020   Hypotension 05/06/2020   Macrocytic anemia 12/15/2019   Non Hodgkin's lymphoma ([place])    Other general symptoms and signs  06/10/2020   Urinary frequency 12/08/2019   UTI (urinary tract infection) 05/06/2020    Past Surgical History:  Procedure Laterality Date   ABDOMINAL HYSTERECTOMY     APPENDECTOMY     CHOLECYSTECTOMY     CYSTOSCOPY WITH STENT PLACEMENT Left 05/12/2020   Procedure: CYSTOSCOPY WITH STENT PLACEMENT;  Surgeon: Lucas Mallow, MD;  Location: [facility];  Service: Urology;  Laterality: Left;   CYSTOSCOPY/URETEROSCOPY/HOLMIUM LASER/STENT PLACEMENT Left 06/23/2020   Procedure: CYSTOSCOPY LEFT diagnostic /URETEROSCOPy /STENT  EXCHANGE ureteral BALLOON DILATION;  Surgeon: Lucas Mallow, MD;  Location: [facility];  Service: Urology;  Laterality: Left;  REQUESTING 1 HR   Double masectomy     KIDNEY SURGERY     MOHS SURGERY     TONSILECTOMY, ADENOIDECTOMY, BILATERAL MYRINGOTOMY AND TUBES      Family History  Problem Relation Age of Onset   Colon cancer Mother    Heart attack Father     Social History   Socioeconomic History   Marital status: Widowed    Spouse name: Not on  file   Number of children: Not on file   Years of education: Not on file   Highest education level: Not on file  Occupational History   Not on file  Tobacco Use   Smoking status: Never   Smokeless tobacco: Never  Vaping Use   Vaping Use: Never used  Substance and Sexual Activity   Alcohol use: Not Currently    Comment: Rare social drinking   Drug use: Never   Sexual activity: Not Currently  Other Topics Concern   Not on file  Social History Narrative   Not on file   Social Determinants of Health   Financial Resource Strain: Low Risk    Difficulty of Paying Living Expenses: Not hard at all  Food Insecurity: No Food Insecurity   Worried About Charity fundraiser  in the Last Year: Never true   [place] in the Last Year: Never true  Transportation Needs: No Transportation Needs   Lack of Transportation (Medical): No   Lack of Transportation (Non-Medical): No  Physical Activity: Inactive   Days of Exercise per Week: 0 days   Minutes of Exercise per Session: 0 min  Stress: No Stress Concern Present   Feeling of Stress : Not at all  Social Connections: Moderately Isolated   Frequency of Communication with Friends and Family: More than three times a week   Frequency of Social Gatherings with Friends and Family: Once a week   Attends Religious Services: 1 to 4 times per year   Active Member of [employer] or Organizations: No   Attends Archivist Meetings: Never   Marital Status: Widowed  Human resources officer Violence: Not At Risk   Fear of Current or Ex-Partner: No   Emotionally Abused: No   Physically Abused: No   Sexually Abused: No    Outpatient Medications Prior to Visit  Medication Sig Dispense Refill   acetaminophen (TYLENOL) 325 MG tablet Take 2 tablets (650 mg total) by mouth every 6 (six) hours as needed for mild pain (or Fever >/= 101).     busPIRone (BUSPAR) 15 MG tablet Take 1 tablet (15 mg total) by mouth 2 (two) times daily. 180 tablet 1   ezetimibe (ZETIA) 10 MG tablet Take 1 tablet (10 mg total) by mouth daily. 90 tablet 3   fluticasone (FLONASE) 50 MCG/ACT nasal spray Place 2 sprays into both nostrils daily. (Patient taking differently: Place 2 sprays into both nostrils as needed for allergies.) 16 g 6   guaifenesin (HUMIBID E) 400 MG TABS tablet Take 400 mg by mouth every 6 (six) hours as needed (allergies/ mucus).     lansoprazole (PREVACID) 30 MG capsule Take 1 capsule (30 mg total) by mouth daily. 30 capsule 0   levothyroxine (SYNTHROID) 25 MCG tablet Take 1 tablet (25 mcg total) by mouth daily. 90 tablet 0   loratadine (CLARITIN) 10 MG tablet Take 10 mg by mouth daily as needed for allergies.     Multiple  Vitamins-Minerals (EQ MULTIVITAMINS ADULT GUMMY) CHEW Chew 1 tablet by mouth in the morning. Unknown strength     potassium chloride (MICRO-K) 10 MEQ CR capsule TAKE 1 CAPSULE BY MOUTH EVERY DAY 90 capsule 0   saccharomyces boulardii (FLORASTOR) 250 MG capsule Take 30 mg by mouth daily.     trimethoprim (TRIMPEX) 100 MG tablet Take 100 mg by mouth daily.     venlafaxine XR (EFFEXOR-XR) 150 MG 24 hr capsule TAKE 1 CAPSULE BY MOUTH EVERY DAY FOR MOOD  90 capsule 3   CRANBERRY EXTRACT PO Take 1 tablet by mouth as needed (UTI). Unknown strength (Patient not taking: Reported on 08/11/2020)     HYDROcodone-acetaminophen (NORCO) 5-325 MG tablet Take 1 tablet by mouth every 4 (four) hours as needed for moderate pain. (Patient not taking: Reported on 08/11/2020) 10 tablet 0   No facility-administered medications prior to visit.    Allergies  Allergen Reactions   Amoxicillin     GI upset    Augmentin [Amoxicillin-Pot Clavulanate]     GI upset    Drug [Tape]     Irritation    Sulfa Antibiotics     Childhood    Atenolol Rash   Macrodantin [Nitrofurantoin] Rash    ROS Review of Systems  Constitutional:  Positive for fatigue. Negative for diaphoresis, fever and unexpected weight change.  HENT:  Positive for postnasal drip, rhinorrhea and sneezing. Negative for sore throat and voice change.   Eyes:  Negative for photophobia and visual disturbance.  Respiratory:  Negative for cough, shortness of breath and wheezing.   Cardiovascular:  Negative for chest pain.  Gastrointestinal:  Positive for abdominal pain and nausea. Negative for blood in stool, constipation, diarrhea, rectal pain and vomiting.  Genitourinary:  Positive for dysuria. Negative for difficulty urinating, frequency, hematuria and urgency.  Musculoskeletal:  Positive for gait problem. Negative for myalgias.  Skin:  Positive for wound.  Psychiatric/Behavioral: Negative.       Objective:    Physical Exam Vitals and nursing note  reviewed.  Constitutional:      General: She is not in acute distress.    Appearance: She is ill-appearing. She is not toxic-appearing or diaphoretic.  HENT:     Head: Normocephalic and atraumatic.     Right Ear: Tympanic membrane, ear canal and external ear normal.     Left Ear: Tympanic membrane, ear canal and external ear normal.     Mouth/Throat:     Mouth: Mucous membranes are moist.     Pharynx: Oropharynx is clear. No oropharyngeal exudate or posterior oropharyngeal erythema.  Eyes:     General: No scleral icterus.       Right eye: No discharge.        Left eye: No discharge.     Extraocular Movements: Extraocular movements intact.     Conjunctiva/sclera: Conjunctivae normal.     Pupils: Pupils are equal, round, and reactive to light.  Cardiovascular:     Rate and Rhythm: Normal rate and regular rhythm.  Pulmonary:     Effort: Pulmonary effort is normal. No respiratory distress.     Breath sounds: Normal breath sounds. No wheezing, rhonchi or rales.  Abdominal:     General: Abdomen is flat. Bowel sounds are normal. There is no distension.     Palpations: Abdomen is soft. There is no mass.     Tenderness: There is abdominal tenderness. There is no guarding or rebound.     Comments: Mild tenderness in the right upper quadrant without guarding or rebound.  Skin:    General: Skin is warm and dry.     Comments: Mid upper back there is a 1 cm x 3 cm ovoid lesion with axis of her spine with overlying crusting  Neurological:     Mental Status: She is alert and oriented to person, place, and time.  Psychiatric:        Mood and Affect: Mood normal.        Behavior: Behavior normal.    BP  118/72   Pulse 86   Temp (!) 97.3 F (36.3 C) (Temporal)   Ht 5\' 3"  (1.6 m)   Wt 161 lb 3.2 oz (73.1 kg)   SpO2 96%   BMI 28.56 kg/m  Wt Readings from Last 3 Encounters:  08/11/20 161 lb 3.2 oz (73.1 kg)  07/28/20 158 lb (71.7 kg)  07/15/20 159 lb (72.1 kg)     Health Maintenance  Due  Topic Date Due   TETANUS/TDAP  Never done   Zoster Vaccines- Shingrix (1 of 2) Never done   DEXA SCAN  Never done   PNA vac Low Risk Adult (1 of 2 - PCV13) Never done    There are no preventive care reminders to display for this patient.  Lab Results  Component Value Date   TSH 5.98 (H) 06/10/2020   Lab Results  Component Value Date   WBC 58.8 (HH) 07/15/2020   HGB 11.3 07/15/2020   HCT 36.1 07/15/2020   MCV 102 (H) 07/15/2020   PLT 184 07/15/2020   Lab Results  Component Value Date   NA 140 06/23/2020   K 4.3 06/23/2020   CO2 25 06/23/2020   GLUCOSE 98 06/23/2020   BUN 18 06/23/2020   CREATININE 0.98 06/23/2020   BILITOT 0.4 05/25/2020   ALKPHOS 102 05/25/2020   AST 15 05/25/2020   ALT 12 05/25/2020   PROT 5.3 (L) 05/25/2020   ALBUMIN 3.7 05/25/2020   CALCIUM 9.3 06/23/2020   ANIONGAP 10 06/23/2020   GFR 47.48 (L) 05/25/2020   No results found for: CHOL No results found for: HDL No results found for: LDLCALC No results found for: TRIG No results found for: CHOLHDL No results found for: HGBA1C    Assessment & Plan:   Problem List Items Addressed This Visit       Respiratory   Allergic rhinitis due to pollen     Musculoskeletal and Integument   Skin lesion of back   Relevant Orders   Ambulatory referral to Dermatology     Other   Elevated temperature - Primary   Relevant Orders   Basic metabolic panel   Urinalysis, Routine w reflex microscopic   Urine Culture   Procalcitonin   Right upper quadrant abdominal pain   Relevant Orders   Amylase   Hepatic function panel    No orders of the defined types were placed in this encounter.   Follow-up: Return if symptoms worsen or fail to improve, for use flonase as directed. .  Would like for the patient to follow-up first of next week if not improving.  If worse go to emergency room.  She also has follow-up scheduled with hematology next week.  Asked her to use her Flonase daily.  Libby Maw, MD

## 2020-08-16 LAB — PROCALCITONIN

## 2020-08-16 LAB — URINE CULTURE
MICRO NUMBER:: 12037438
SPECIMEN QUALITY:: ADEQUATE

## 2020-08-16 LAB — EXTRA SPECIMEN

## 2020-08-17 ENCOUNTER — Other Ambulatory Visit (HOSPITAL_COMMUNITY): Payer: Medicare Other

## 2020-08-20 ENCOUNTER — Other Ambulatory Visit: Payer: Self-pay

## 2020-08-20 ENCOUNTER — Other Ambulatory Visit (INDEPENDENT_AMBULATORY_CARE_PROVIDER_SITE_OTHER): Payer: Medicare Other

## 2020-08-20 DIAGNOSIS — R509 Fever, unspecified: Secondary | ICD-10-CM | POA: Diagnosis not present

## 2020-08-20 NOTE — Progress Notes (Signed)
Per orders of Dr. Ethelene Hal pt is here for labs pt tolerated draw well.

## 2020-08-25 ENCOUNTER — Inpatient Hospital Stay (HOSPITAL_BASED_OUTPATIENT_CLINIC_OR_DEPARTMENT_OTHER): Payer: Medicare Other | Admitting: Hematology and Oncology

## 2020-08-25 ENCOUNTER — Ambulatory Visit: Payer: Medicare Other | Admitting: Hematology and Oncology

## 2020-08-25 ENCOUNTER — Other Ambulatory Visit: Payer: Self-pay

## 2020-08-25 ENCOUNTER — Inpatient Hospital Stay: Payer: Medicare Other | Attending: Hematology and Oncology

## 2020-08-25 VITALS — BP 106/77 | HR 100 | Temp 98.2°F | Resp 18 | Wt 160.3 lb

## 2020-08-25 DIAGNOSIS — Z853 Personal history of malignant neoplasm of breast: Secondary | ICD-10-CM | POA: Insufficient documentation

## 2020-08-25 DIAGNOSIS — Z170 Estrogen receptor positive status [ER+]: Secondary | ICD-10-CM | POA: Insufficient documentation

## 2020-08-25 DIAGNOSIS — C9110 Chronic lymphocytic leukemia of B-cell type not having achieved remission: Secondary | ICD-10-CM | POA: Diagnosis present

## 2020-08-25 DIAGNOSIS — Z0000 Encounter for general adult medical examination without abnormal findings: Secondary | ICD-10-CM

## 2020-08-25 DIAGNOSIS — Z79899 Other long term (current) drug therapy: Secondary | ICD-10-CM | POA: Insufficient documentation

## 2020-08-25 LAB — CBC WITH DIFFERENTIAL (CANCER CENTER ONLY)
Abs Immature Granulocytes: 0.1 10*3/uL — ABNORMAL HIGH (ref 0.00–0.07)
Basophils Absolute: 0 10*3/uL (ref 0.0–0.1)
Basophils Relative: 0 %
Eosinophils Absolute: 0.7 10*3/uL — ABNORMAL HIGH (ref 0.0–0.5)
Eosinophils Relative: 1 %
HCT: 32.9 % — ABNORMAL LOW (ref 36.0–46.0)
Hemoglobin: 10.4 g/dL — ABNORMAL LOW (ref 12.0–15.0)
Immature Granulocytes: 0 %
Lymphocytes Relative: 95 %
Lymphs Abs: 48.9 10*3/uL — ABNORMAL HIGH (ref 0.7–4.0)
MCH: 32.8 pg (ref 26.0–34.0)
MCHC: 31.6 g/dL (ref 30.0–36.0)
MCV: 103.8 fL — ABNORMAL HIGH (ref 80.0–100.0)
Monocytes Absolute: 0.7 10*3/uL (ref 0.1–1.0)
Monocytes Relative: 1 %
Neutro Abs: 1.6 10*3/uL — ABNORMAL LOW (ref 1.7–7.7)
Neutrophils Relative %: 3 %
Platelet Count: 193 10*3/uL (ref 150–400)
RBC: 3.17 MIL/uL — ABNORMAL LOW (ref 3.87–5.11)
RDW: 17.5 % — ABNORMAL HIGH (ref 11.5–15.5)
WBC Count: 52 10*3/uL (ref 4.0–10.5)
nRBC: 0 % (ref 0.0–0.2)

## 2020-08-25 LAB — CMP (CANCER CENTER ONLY)
ALT: 12 U/L (ref 0–44)
AST: 18 U/L (ref 15–41)
Albumin: 3.6 g/dL (ref 3.5–5.0)
Alkaline Phosphatase: 116 U/L (ref 38–126)
Anion gap: 11 (ref 5–15)
BUN: 24 mg/dL — ABNORMAL HIGH (ref 8–23)
CO2: 19 mmol/L — ABNORMAL LOW (ref 22–32)
Calcium: 9 mg/dL (ref 8.9–10.3)
Chloride: 108 mmol/L (ref 98–111)
Creatinine: 1.3 mg/dL — ABNORMAL HIGH (ref 0.44–1.00)
GFR, Estimated: 42 mL/min — ABNORMAL LOW (ref >60)
Glucose, Bld: 183 mg/dL — ABNORMAL HIGH (ref 70–99)
Potassium: 4.2 mmol/L (ref 3.5–5.1)
Sodium: 138 mmol/L (ref 135–145)
Total Bilirubin: 0.5 mg/dL (ref 0.3–1.2)
Total Protein: 5.8 g/dL — ABNORMAL LOW (ref 6.5–8.1)

## 2020-08-25 LAB — PROCALCITONIN: Procalcitonin: <0.1 ng/mL (ref <0.10)

## 2020-08-25 LAB — LACTATE DEHYDROGENASE: LDH: 254 U/L — ABNORMAL HIGH (ref 98–192)

## 2020-08-25 LAB — URIC ACID: Uric Acid, Serum: 7.3 mg/dL — ABNORMAL HIGH (ref 2.5–7.1)

## 2020-08-25 NOTE — Progress Notes (Signed)
CRITICAL VALUE STICKER  CRITICAL VALUE: WBC 52.0  RECEIVER (on-site recipient of call): Harrel Lemon, RN  DATE & TIME NOTIFIED: 08/25/20 AT 1123  MESSENGER (representative from lab): [employer].  MD NOTIFIED: Dr. Lorenso Courier  TIME OF NOTIFICATION: 08/25/20 at 1128  RESPONSE:  Will see at scheduled appt.

## 2020-08-25 NOTE — Progress Notes (Signed)
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 No treatment pursued at that time.  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  4) 06/14/15-received R-Benda, but d/c due to poor tolerance 5) 05/06/18-06/26/2018: ibrutinib therapy, d/c due to poor tolerance 6) 09/02/2019: last visit with Dr. Phillip Heal at the [facility] clinic in [place], [place].  6) 10/03/2019: establish care with Dr. 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 female with medical history significant for CLL and breast cancer in remission who presents for a follow up visit. The patient's last visit was on 05/28/2020. In the interim since the last visit Mrs. [name] has had a stent placed and has been found to have a skin cancer on her back currently scheduled for Mohs surgery.   On exam today [name] is accompanied by her daughter.  She reports that having the stent placed in her bladder has unfortunately caused more issues with urinary retention that it is helped.  She notes that she does have increasing shortness of breath but that her cardiologist did not believe this was due to her heart or lungs.  She notes that she is concerned about her uric acid levels and asked that we add that test on today as she does not feel symptomatic when her uric acid levels are elevated.  She is currently taking a  multivitamin.  She has noticed no new lymphadenopathy and continues to have a good appetite without abdominal distention.  She otherwise denies having any issues today with fevers, chills, sweats, nausea, vomiting or diarrhea.  Her weight has been stable.  A full 10 point ROS is listed below.  MEDICAL HISTORY:  Past Medical History:  Diagnosis Date   Acute cystitis with hematuria 12/15/2019   AKI (acute kidney injury) ([place]) 05/06/2020   Allergic rhinitis due to pollen 12/08/2019   Allergy    Anxiety    Arthritis    CLL (chronic lymphocytic leukemia) ([place])    Coronary artery disease 06/09/2020   Dyspnea    continuos   Elevated cholesterol 12/08/2019   Elevated TSH 06/10/2020   Fatigue 06/10/2020   GERD (gastroesophageal reflux disease)    Gout    History of gout 12/08/2019   History of heart attack    History of MI (myocardial infarction) 12/08/2019   History of myocardial infarction 12/08/2019   HLD (hyperlipidemia)    Hospital discharge follow-up 05/25/2020   Hypertension    Hypokalemia 05/25/2020   Hypotension 05/06/2020   Macrocytic anemia 12/15/2019   Non Hodgkin's lymphoma ([place])    Other general symptoms and signs  06/10/2020   Urinary frequency 12/08/2019   UTI (urinary tract infection) 05/06/2020    SURGICAL HISTORY: Past Surgical History:  Procedure Laterality Date   ABDOMINAL HYSTERECTOMY     APPENDECTOMY     CHOLECYSTECTOMY     CYSTOSCOPY WITH STENT PLACEMENT Left 05/12/2020   Procedure: CYSTOSCOPY WITH  STENT PLACEMENT;  Surgeon: Lucas Mallow, MD;  Location: [facility];  Service: Urology;  Laterality: Left;   CYSTOSCOPY/URETEROSCOPY/HOLMIUM LASER/STENT PLACEMENT Left 06/23/2020   Procedure: CYSTOSCOPY LEFT diagnostic /URETEROSCOPy /STENT  EXCHANGE ureteral BALLOON DILATION;  Surgeon: Lucas Mallow, MD;  Location: [facility];  Service: Urology;  Laterality: Left;  REQUESTING 1 HR   Double masectomy     KIDNEY SURGERY     MOHS SURGERY     TONSILECTOMY, ADENOIDECTOMY,  BILATERAL MYRINGOTOMY AND TUBES      SOCIAL HISTORY: Social History   Socioeconomic History   Marital status: Widowed    Spouse name: Not on file   Number of children: Not on file   Years of education: Not on file   Highest education level: Not on file  Occupational History   Not on file  Tobacco Use   Smoking status: Never   Smokeless tobacco: Never  Vaping Use   Vaping Use: Never used  Substance and Sexual Activity   Alcohol use: Not Currently    Comment: Rare social drinking   Drug use: Never   Sexual activity: Not Currently  Other Topics Concern   Not on file  Social History Narrative   Not on file   Social Determinants of Health   Financial Resource Strain: Low Risk    Difficulty of Paying Living Expenses: Not hard at all  Food Insecurity: No Food Insecurity   Worried About Charity fundraiser in the Last Year: Never true   [place] in the Last Year: Never true  Transportation Needs: No Transportation Needs   Lack of Transportation (Medical): No   Lack of Transportation (Non-Medical): No  Physical Activity: Inactive   Days of Exercise per Week: 0 days   Minutes of Exercise per Session: 0 min  Stress: No Stress Concern Present   Feeling of Stress : Not at all  Social Connections: Moderately Isolated   Frequency of Communication with Friends and Family: More than three times a week   Frequency of Social Gatherings with Friends and Family: Once a week   Attends Religious Services: 1 to 4 times per year   Active Member of [employer] or Organizations: No   Attends Archivist Meetings: Never   Marital Status: Widowed  Human resources officer Violence: Not At Risk   Fear of Current or Ex-Partner: No   Emotionally Abused: No   Physically Abused: No   Sexually Abused: No    FAMILY HISTORY: Family History  Problem Relation Age of Onset   Colon cancer Mother    Heart attack Father     ALLERGIES:  is allergic to amoxicillin, augmentin [amoxicillin-pot  clavulanate], drug [tape], sulfa antibiotics, atenolol, and macrodantin [nitrofurantoin].  MEDICATIONS:  Current Outpatient Medications  Medication Sig Dispense Refill   acetaminophen (TYLENOL) 325 MG tablet Take 2 tablets (650 mg total) by mouth every 6 (six) hours as needed for mild pain (or Fever >/= 101).     busPIRone (BUSPAR) 15 MG tablet Take 1 tablet (15 mg total) by mouth 2 (two) times daily. 180 tablet 1   CRANBERRY EXTRACT PO Take 1 tablet by mouth as needed (UTI). Unknown strength (Patient not taking: Reported on 08/11/2020)     ezetimibe (ZETIA) 10 MG tablet Take 1 tablet (10 mg total) by mouth daily. 90 tablet 3   fluticasone (FLONASE) 50 MCG/ACT nasal spray Place 2 sprays into both nostrils daily. (Patient taking differently: Place 2 sprays into both nostrils as  needed for allergies.) 16 g 6   guaifenesin (HUMIBID E) 400 MG TABS tablet Take 400 mg by mouth every 6 (six) hours as needed (allergies/ mucus).     HYDROcodone-acetaminophen (NORCO) 5-325 MG tablet Take 1 tablet by mouth every 4 (four) hours as needed for moderate pain. (Patient not taking: Reported on 08/11/2020) 10 tablet 0   lansoprazole (PREVACID) 30 MG capsule Take 1 capsule (30 mg total) by mouth daily. 30 capsule 0   levothyroxine (SYNTHROID) 25 MCG tablet Take 1 tablet (25 mcg total) by mouth daily. 90 tablet 0   loratadine (CLARITIN) 10 MG tablet Take 10 mg by mouth daily as needed for allergies.     Multiple Vitamins-Minerals (EQ MULTIVITAMINS ADULT GUMMY) CHEW Chew 1 tablet by mouth in the morning. Unknown strength     potassium chloride (MICRO-K) 10 MEQ CR capsule TAKE 1 CAPSULE BY MOUTH EVERY DAY 90 capsule 0   saccharomyces boulardii (FLORASTOR) 250 MG capsule Take 30 mg by mouth daily.     trimethoprim (TRIMPEX) 100 MG tablet Take 100 mg by mouth daily.     venlafaxine XR (EFFEXOR-XR) 150 MG 24 hr capsule TAKE 1 CAPSULE BY MOUTH EVERY DAY FOR MOOD 90 capsule 3   No current facility-administered medications  for this visit.    REVIEW OF SYSTEMS:   Constitutional: ( - ) fevers, ( - )  chills , ( - ) night sweats Eyes: ( - ) blurriness of vision, ( - ) double vision, ( - ) watery eyes Ears, nose, mouth, throat, and face: ( - ) mucositis, ( - ) sore throat Respiratory: ( - ) cough, ( - ) dyspnea, ( - ) wheezes Cardiovascular: ( - ) palpitation, ( - ) chest discomfort, ( - ) lower extremity swelling Gastrointestinal:  ( - ) nausea, ( - ) heartburn, ( - ) change in bowel habits Skin: ( - ) abnormal skin rashes Lymphatics: ( - ) new lymphadenopathy, ( - ) easy bruising Neurological: ( - ) numbness, ( - ) tingling, ( - ) new weaknesses Behavioral/Psych: ( - ) mood change, ( - ) new changes  All other systems were reviewed with the patient and are negative.  PHYSICAL EXAMINATION: ECOG PERFORMANCE STATUS: 3 - Symptomatic, >50% confined to bed  Vitals:   08/25/20 1137  BP: 106/77  Pulse: 100  Resp: 18  Temp: 98.2 F (36.8 C)  SpO2: 97%   Filed Weights   08/25/20 1137  Weight: 160 lb 4.8 oz (72.7 kg)    GENERAL: chronically ill appearing elderly Caucasian female in NAD  SKIN: skin color, texture, turgor are normal, no rashes or significant lesions EYES: conjunctiva are pink and non-injected, sclera clear LUNGS: clear to auscultation and percussion with normal breathing effort HEART: regular rate & rhythm and no murmurs and no lower extremity edema ABDOMEN: soft, non-tender, non-distended, normal bowel sounds Musculoskeletal: no cyanosis of digits and no clubbing  PSYCH: alert & oriented x 3, fluent speech NEURO: no focal motor/sensory deficits  LABORATORY DATA:  I have reviewed the data as listed CBC Latest Ref Rng & Units 08/25/2020 07/15/2020 06/14/2020  WBC 4.0 - 10.5 K/uL 52.0(HH) 58.8(HH) 51.1(HH)  Hemoglobin 12.0 - 15.0 g/dL 10.4(L) 11.3 9.9(L)  Hematocrit 36.0 - 46.0 % 32.9(L) 36.1 33.6(L)  Platelets 150 - 400 K/uL 193 184 265    CMP Latest Ref Rng & Units 08/25/2020 08/11/2020  06/23/2020  Glucose 70 - 99 mg/dL 183(H) 72 98  BUN 8 - 23 mg/dL 24(H)  17 18  Creatinine 0.44 - 1.00 mg/dL 1.30(H) 1.25(H) 0.98  Sodium 135 - 145 mmol/L 138 139 140  Potassium 3.5 - 5.1 mmol/L 4.2 4.0 4.3  Chloride 98 - 111 mmol/L 108 101 105  CO2 22 - 32 mmol/L 19(L) 24 25  Calcium 8.9 - 10.3 mg/dL 9.0 9.3 9.3  Total Protein 6.5 - 8.1 g/dL 5.8(L) 6.0 -  Total Bilirubin 0.3 - 1.2 mg/dL 0.5 0.9 -  Alkaline Phos 38 - 126 U/L 116 133(H) -  AST 15 - 41 U/L 18 20 -  ALT 0 - 44 U/L 12 16 -    RADIOGRAPHIC STUDIES: No results found.   ASSESSMENT & PLAN Kenadi Miltner 80 y.o. female with medical history significant for CLL and breast cancer in remission who presents for a follow up visit.  After review of the labs, the records, and discussion with the patient the findings are most consistent with a diagnosis of CLL that does not currently require treatment.  She has no palpable lymphadenopathy and no evidence of anemia and thrombocytopenia.  As such I do believe would be appropriate to continue observation at this time.  She has been attempted on therapy as before with ibrutinib and rituximab bendamustine but is not been able to tolerate these treatments.  As such I do believe that observation alone with discussion of comfort based care if the patient were to have progression.  We will plan to see the patient back in approximately 3 months time in order to reassess.  #Chronic Lymphocytic Leukemia --based on prior labs and studies the patient has a confirmed diagnosis of CLL.  --will order CBC, CMP, LDH today --patient has attempted prior therapy with ibrutinib and R-Benda but not been able to tolerate thearpy.  --CT scan from 05/08/20 shows lymphadenopathy and splenomegaly. Patient does not have stably low anemia/thrombocytopenia on labs (appears to have had a transient recovering anemia while in house with UTI).  --RTC in 6 months or sooner if treatment is indicated/more evaluation is required.     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 --continue yearly mammograms    Orders Placed This Encounter  Procedures   Uric acid    Standing Status:   Future    Number of Occurrences:   1    Standing Expiration Date:   08/25/2021   All questions were answered. The patient knows to call the clinic with any problems, questions or concerns.  A total of more than 30 minutes were spent on this encounter and over half of that time was spent on counseling and coordination of care as outlined above.   Ledell Peoples, MD Department of Hematology/Oncology [place] at [facility] Phone: [phone] Pager: [phone] Email: [email or web address]  08/25/2020 4:41 PM

## 2020-08-26 ENCOUNTER — Telehealth: Payer: Self-pay | Admitting: Hematology and Oncology

## 2020-08-26 NOTE — Telephone Encounter (Signed)
Scheduled follow-up appointment per 7/6 los. Patient's daughter is aware.

## 2020-08-27 ENCOUNTER — Telehealth: Payer: Self-pay | Admitting: *Deleted

## 2020-08-27 NOTE — Telephone Encounter (Signed)
TCT pt ans spoke with her daughter regarding recent lab results. Advised that pt's only abnormaility was a slightly elevated Uric acid ;evel. Advised that it could trigger gout-like symptoms, kidney stones. Advised to make sure pt is drinking  a good amopunt of water/fluids-6-8 glasses per day.  Daughter voiced understanding. No further questions or concerns.

## 2020-08-27 NOTE — Telephone Encounter (Signed)
-----   Message from Orson Slick, MD sent at 08/26/2020  6:31 PM EDT ----- Please let Mrs. Kady know that her uric acid is 7.3, slightly above normal (<7.1). There were no other concerning abnormalities in her labs. We will see her back in 6 months time.    ----- Message ----- From: Buel Ream, Lab In [place] Sent: 08/25/2020  11:24 AM EDT To: Orson Slick, MD

## 2020-08-30 ENCOUNTER — Other Ambulatory Visit: Payer: Self-pay

## 2020-08-30 ENCOUNTER — Ambulatory Visit (HOSPITAL_COMMUNITY)
Admission: RE | Admit: 2020-08-30 | Discharge: 2020-08-30 | Disposition: A | Discharge status: Home / Self Care | Payer: Medicare Other | Source: Ambulatory Visit | Attending: Urology | Admitting: Urology

## 2020-08-30 DIAGNOSIS — N130 Hydronephrosis with ureteropelvic junction obstruction: Secondary | ICD-10-CM | POA: Diagnosis present

## 2020-08-30 MED ORDER — FUROSEMIDE 10 MG/ML IJ SOLN: 37.0000 mg | Freq: Once | INTRAMUSCULAR | Status: DC

## 2020-08-30 MED ORDER — FUROSEMIDE 10 MG/ML IJ SOLN
INTRAMUSCULAR | Status: AC
  Filled 2020-08-30: qty 4

## 2020-08-30 MED ORDER — TECHNETIUM TC 99M MERTIATIDE
5.3000 | Freq: Once | INTRAVENOUS | Status: AC
  Administered 2020-08-30: 5.3 via INTRAVENOUS

## 2020-09-02 ENCOUNTER — Other Ambulatory Visit: Payer: Self-pay | Admitting: Family Medicine

## 2020-09-02 NOTE — Telephone Encounter (Signed)
Chart supports Rx Last ov 08/11/20 No future appointment scheduled.

## 2020-09-10 ENCOUNTER — Ambulatory Visit: Payer: Medicare Other | Admitting: Cardiology

## 2020-09-11 ENCOUNTER — Other Ambulatory Visit: Payer: Self-pay | Admitting: Family Medicine

## 2020-09-11 DIAGNOSIS — R7989 Other specified abnormal findings of blood chemistry: Secondary | ICD-10-CM

## 2020-09-11 DIAGNOSIS — R5383 Other fatigue: Secondary | ICD-10-CM

## 2020-09-11 DIAGNOSIS — E039 Hypothyroidism, unspecified: Secondary | ICD-10-CM

## 2020-09-15 ENCOUNTER — Other Ambulatory Visit: Payer: Self-pay

## 2020-09-15 ENCOUNTER — Ambulatory Visit (INDEPENDENT_AMBULATORY_CARE_PROVIDER_SITE_OTHER): Payer: Medicare Other | Admitting: Nurse Practitioner

## 2020-09-15 ENCOUNTER — Encounter: Payer: Self-pay | Admitting: Nurse Practitioner

## 2020-09-15 VITALS — BP 104/78 | HR 92 | Temp 97.0°F | Ht 63.0 in | Wt 159.0 lb

## 2020-09-15 DIAGNOSIS — J301 Allergic rhinitis due to pollen: Secondary | ICD-10-CM | POA: Diagnosis not present

## 2020-09-15 DIAGNOSIS — B309 Viral conjunctivitis, unspecified: Secondary | ICD-10-CM | POA: Diagnosis not present

## 2020-09-15 MED ORDER — SYSTANE 0.4-0.3 % OP SOLN: 1.0000 [drp] | OPHTHALMIC | 0 refills | Status: AC | PRN

## 2020-09-15 MED ORDER — IPRATROPIUM BROMIDE 0.03 % NA SOLN: 2.0000 | Freq: Two times a day (BID) | NASAL | 0 refills | Status: AC

## 2020-09-15 NOTE — Progress Notes (Signed)
Subjective:  Patient ID: Sarah [name], female    DOB: March 16, 1940  Age: 80 y.o. MRN: [id number]  CC: Acute Visit (Pt c/o eye irritation (itching, burning, and feels like something is in L eye.) x 4 days. Pt states when it started she started taking allergy medication and it felt like it was getting better but this morning it was worse. )  Eye Problem  Both eyes are affected. This is a new problem. The current episode started in the past 7 days. The problem occurs constantly. The problem has been unchanged. There was no injury mechanism. There is No known exposure to pink eye. She Does not wear contacts. Associated symptoms include an eye discharge, eye redness, a foreign body sensation and photophobia. Pertinent negatives include no double vision, fever, itching, nausea, recent URI or vomiting. She has tried nothing for the symptoms.   She also reports chronic rhinitis, no improvement with flonase.  Reviewed past Medical, Social and Family history today.  Outpatient Medications Prior to Visit  Medication Sig Dispense Refill   acetaminophen (TYLENOL) 325 MG tablet Take 2 tablets (650 mg total) by mouth every 6 (six) hours as needed for mild pain (or Fever >/= 101).     busPIRone (BUSPAR) 15 MG tablet Take 1 tablet (15 mg total) by mouth 2 (two) times daily. 180 tablet 1   ezetimibe (ZETIA) 10 MG tablet Take 1 tablet (10 mg total) by mouth daily. 90 tablet 3   fluticasone (FLONASE) 50 MCG/ACT nasal spray Place 2 sprays into both nostrils daily. (Patient taking differently: Place 2 sprays into both nostrils as needed for allergies.) 16 g 6   guaifenesin (HUMIBID E) 400 MG TABS tablet Take 400 mg by mouth every 6 (six) hours as needed (allergies/ mucus).     lansoprazole (PREVACID) 30 MG capsule TAKE 1 CAPSULE BY MOUTH EVERY DAY 30 capsule 0   levothyroxine (SYNTHROID) 50 MCG tablet TAKE 1 TABLET (50 MCG TOTAL) BY MOUTH DAILY. 30 MINUTES PRIOR TO EATING. 90 tablet 0   loratadine (CLARITIN) 10 MG  tablet Take 10 mg by mouth daily as needed for allergies.     Multiple Vitamins-Minerals (EQ MULTIVITAMINS ADULT GUMMY) CHEW Chew 1 tablet by mouth in the morning. Unknown strength     potassium chloride (MICRO-K) 10 MEQ CR capsule TAKE 1 CAPSULE BY MOUTH EVERY DAY 90 capsule 0   saccharomyces boulardii (FLORASTOR) 250 MG capsule Take 30 mg by mouth daily.     trimethoprim (TRIMPEX) 100 MG tablet Take 100 mg by mouth daily.     venlafaxine XR (EFFEXOR-XR) 150 MG 24 hr capsule TAKE 1 CAPSULE BY MOUTH EVERY DAY FOR MOOD 90 capsule 3   CRANBERRY EXTRACT PO Take 1 tablet by mouth as needed (UTI). Unknown strength (Patient not taking: No sig reported)     HYDROcodone-acetaminophen (NORCO) 5-325 MG tablet Take 1 tablet by mouth every 4 (four) hours as needed for moderate pain. (Patient not taking: No sig reported) 10 tablet 0   No facility-administered medications prior to visit.    ROS See HPI  Objective:  BP 104/78 (BP Location: Left Arm, Patient Position: Sitting, Cuff Size: Large)   Pulse 92   Temp (!) 97 F (36.1 C) (Temporal)   Ht '5\' 3"'$  (1.6 m)   Wt 159 lb (72.1 kg)   SpO2 98%   BMI 28.17 kg/m   Physical Exam Vitals reviewed.  Eyes:     General: Lids are normal. No allergic shiner or scleral icterus.  Right eye: No discharge or hordeolum.        Left eye: No discharge or hordeolum.     Extraocular Movements: Extraocular movements intact.     Conjunctiva/sclera:     Right eye: Right conjunctiva is injected. No exudate or hemorrhage.    Left eye: Left conjunctiva is injected. No exudate or hemorrhage. Pulmonary:     Effort: Pulmonary effort is normal.  Musculoskeletal:     Cervical back: Normal range of motion and neck supple.  Lymphadenopathy:     Cervical: No cervical adenopathy.  Neurological:     Mental Status: She is alert.    Assessment & Plan:  This visit occurred during the SARS-CoV-2 public health emergency.  Safety protocols were in place, including  screening questions prior to the visit, additional usage of staff PPE, and extensive cleaning of exam room while observing appropriate contact time as indicated for disinfecting solutions.   Sarah [name] was seen today for acute visit.  Diagnoses and all orders for this visit:  Viral conjunctivitis of both eyes -     Polyethyl Glycol-Propyl Glycol (SYSTANE) 0.4-0.3 % SOLN; Apply 1 drop to eye as needed.  Allergic rhinitis due to pollen, unspecified seasonality -     ipratropium (ATROVENT) 0.03 % nasal spray; Place 2 sprays into both nostrils 2 (two) times daily.   Problem List Items Addressed This Visit       Respiratory   Allergic rhinitis due to pollen   Relevant Medications   ipratropium (ATROVENT) 0.03 % nasal spray   Other Visit Diagnoses     Viral conjunctivitis of both eyes    -  Primary   Relevant Medications   Polyethyl Glycol-Propyl Glycol (SYSTANE) 0.4-0.3 % SOLN       Follow-up: No follow-ups on file.  Wilfred Lacy, NP

## 2020-09-15 NOTE — Patient Instructions (Signed)
Schedule appt with ophthalmologist  Viral Conjunctivitis, Adult  Viral conjunctivitis is an inflammation of the clear membrane that covers the white part of the eye and the inner surface of the eyelid (conjunctiva). The inflammation is caused by a viral infection. The blood vessels in the conjunctiva become enlarged, causing the eye to become red or pink and often itchy. It usually starts in one eye and goes to the other in a day or two. Infections often resolve over 1-2 weeks. Viral conjunctivitis is contagious. This means it can be easily passed from one person to another. This conditionis often called pink eye. What are the causes? This condition is caused by a virus. It can be spread by touching objects that have been contaminated with the virus, such as doorknobs or towels, and then touching your eye. It can also be passed through tiny droplets, such as fromcoughing or sneezing. What increases the risk? You are more likely to develop this condition if you have a cold or the flu, orare in close contact with a person with pink eye. What are the signs or symptoms? Symptoms of this condition include: Redness in the eye. Tearing or watery eyes. Itchy and irritated eyes. Burning feeling in the eyes. Clear drainage from the eye. Swollen eyelids. A gritty feeling in the eye. Light sensitivity. This condition often occurs with other symptoms, such as nasal congestion,cough, and fever. How is this diagnosed? This condition is diagnosed with a medical history and physical exam. If you have discharge from your eye, the discharge may be tested to rule out othercauses of conjunctivitis. How is this treated? Viral conjunctivitis does not respond to medicines that kill bacteria (antibiotics). The condition most often resolves on its own in 1-2 weeks. If treatment is needed, it is aimed at relieving your symptoms and preventing the spread of infection. This may be done with artificial tear drops,  antihistamine drops, or other eye medicines. In rare cases, steroid eye drops or anti-herpes virusmedicines may be prescribed. Follow these instructions at home: Medicines  Take or apply over-the-counter and prescription medicines only as told by your health care provider. Do not touch the edge of the eyelid with the eye-drop bottle or ointment tube when applying medicines to the affected eye. This will prevent the spread of the infection to the other eye or to other people.  Eye care Avoid touching or rubbing your eyes. Apply a clean, cool, wet washcloth onto your eye for 10-20 minutes, 3-4 times per day, or as told by your health care provider. If you wear contact lenses, do not wear them until the inflammation is gone and your health care provider says it is safe to wear them again. Ask your health care provider how to disinfect or replace your contact lenses before using them again. Wear glasses until you can resume wearing contacts. Avoid wearing eye makeup until the inflammation is gone. Throw away any old eye cosmetics that may be contaminated. Gently wipe away any crusting from your eye with a wet washcloth or a cotton ball. General instructions Change or wash your pillowcase every day or as told by your health care provider. Do not share towels, pillowcases, washcloths, eye makeup, makeup brushes, contact lenses, or eyeglasses. This may spread the infection. Wash your hands often with soap and water. Use paper towels to dry your hands. If soap and water are not available, use hand sanitizer. Avoid contact with other people until your eye is no longer red and tearing, or as told  by your health care provider. Contact a health care provider if: Your symptoms do not improve with treatment, or they get worse. You have increased pain. Your vision becomes blurry. You have a fever. You have facial pain, redness, or swelling. You have yellow or green drainage coming from your eye. You have  new symptoms. Get help right away if: You develop severe pain. Your vision gets much worse. Summary Viral conjunctivitis is an inflammation of the clear membrane that covers the white part of the eye and the inner surface of the eyelid. It usually goes away in 1-2 weeks. This condition is usually treated with medicines and cold compresses. Treatment focuses on relieving the symptoms. This condition is very contagious. To prevent infection, avoid close contact with others, wash your hands often, and do not share towels or washcloths. Contact a health care provider if your symptoms do not go away with treatment, or if you have more pain, poor vision, or swelling in the eyes. Get help right away if you have severe pain or your vision gets much worse. This information is not intended to replace advice given to you by your health care provider. Make sure you discuss any questions you have with your healthcare provider. Document Revised: 01/06/2019 Document Reviewed: 12/20/2018 Elsevier Patient Education  2022 [employer].

## 2020-09-28 ENCOUNTER — Telehealth: Payer: Self-pay

## 2020-09-28 ENCOUNTER — Other Ambulatory Visit: Payer: Self-pay | Admitting: Family Medicine

## 2020-09-28 ENCOUNTER — Other Ambulatory Visit: Payer: Self-pay

## 2020-09-28 NOTE — Telephone Encounter (Signed)
Palliative care follow up call. Spoke with daughter who discussed that patient was recently diagnosed and treated for viral conjuntivitis.  Pt  continues to have body aches, low grade fever and is fatigued.  Recommended follow up with PCP, daughter verbalized understanding.  Follow up appt made with community palliative for Sept. 7th at 1030 with palliative MD.

## 2020-09-29 ENCOUNTER — Other Ambulatory Visit: Payer: Self-pay

## 2020-09-29 ENCOUNTER — Encounter: Payer: Self-pay | Admitting: Cardiology

## 2020-09-29 ENCOUNTER — Ambulatory Visit (INDEPENDENT_AMBULATORY_CARE_PROVIDER_SITE_OTHER): Payer: Medicare Other | Admitting: Cardiology

## 2020-09-29 VITALS — BP 104/58 | HR 86 | Ht 63.0 in | Wt 159.0 lb

## 2020-09-29 DIAGNOSIS — Z8739 Personal history of other diseases of the musculoskeletal system and connective tissue: Secondary | ICD-10-CM

## 2020-09-29 DIAGNOSIS — E876 Hypokalemia: Secondary | ICD-10-CM | POA: Diagnosis not present

## 2020-09-29 DIAGNOSIS — I2510 Atherosclerotic heart disease of native coronary artery without angina pectoris: Secondary | ICD-10-CM | POA: Diagnosis not present

## 2020-09-29 DIAGNOSIS — E7800 Pure hypercholesterolemia, unspecified: Secondary | ICD-10-CM | POA: Diagnosis not present

## 2020-09-29 DIAGNOSIS — C9110 Chronic lymphocytic leukemia of B-cell type not having achieved remission: Secondary | ICD-10-CM | POA: Diagnosis not present

## 2020-09-29 DIAGNOSIS — I10 Essential (primary) hypertension: Secondary | ICD-10-CM

## 2020-09-29 NOTE — Progress Notes (Signed)
Cardiology Office Note:    Date:  09/29/2020   ID:  Sarah [name], DOB 1941/02/11, MRN [id number]  PCP:  Libby Maw, MD  Cardiologist:  Jenne Campus, MD    Referring MD: Libby Maw,*   Chief Complaint  Patient presents with   Shortness of Breath    Improved    Joint Pain    History of Present Illness:    Sarah [name] is a 80 y.o. female with past medical history significant for coronary artery disease status post microinfarction 1999 however echocardiogram done recently showed preserved left ventricle ejection fraction.  She did wear a monitor which showed supraventricular tachycardia try to put her on digoxin however she was not able to tolerate this because of side effect.  She has been complaining of being weak tired exhausted.  Today also complaining of having multiple joint pains.  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  She was seen recently by oncologist who does not think there was any indication for rehab duration of the problem and therapy for her oncological issues.  Overall after admit I am not sure exactly why she is feeling poorly.  Past Medical History:  Diagnosis Date   Acute cystitis with hematuria 12/15/2019   AKI (acute kidney injury) ([place]) 05/06/2020   Allergic rhinitis due to pollen 12/08/2019   Allergy    Anxiety    Arthritis    CLL (chronic lymphocytic leukemia) (HCC)    Coronary artery disease 06/09/2020   Dyspnea    continuos   Elevated cholesterol 12/08/2019   Elevated TSH 06/10/2020   Fatigue 06/10/2020   GERD (gastroesophageal reflux disease)    Gout    History of gout 12/08/2019   History of heart attack    History of MI (myocardial infarction) 12/08/2019   History of myocardial infarction 12/08/2019   HLD (hyperlipidemia)    Hospital discharge follow-up 05/25/2020   Hypertension    Hypokalemia 05/25/2020   Hypotension 05/06/2020   Macrocytic anemia 12/15/2019   Non Hodgkin's lymphoma ([place])    Other  general symptoms and signs  06/10/2020   Urinary frequency 12/08/2019   UTI (urinary tract infection) 05/06/2020    Past Surgical History:  Procedure Laterality Date   ABDOMINAL HYSTERECTOMY     APPENDECTOMY     CHOLECYSTECTOMY     CYSTOSCOPY WITH STENT PLACEMENT Left 05/12/2020   Procedure: CYSTOSCOPY WITH STENT PLACEMENT;  Surgeon: Lucas Mallow, MD;  Location: [facility];  Service: Urology;  Laterality: Left;   CYSTOSCOPY/URETEROSCOPY/HOLMIUM LASER/STENT PLACEMENT Left 06/23/2020   Procedure: CYSTOSCOPY LEFT diagnostic /URETEROSCOPy /STENT  EXCHANGE ureteral BALLOON DILATION;  Surgeon: Lucas Mallow, MD;  Location: [facility];  Service: Urology;  Laterality: Left;  REQUESTING 1 HR   Double masectomy     KIDNEY SURGERY     MOHS SURGERY     TONSILECTOMY, ADENOIDECTOMY, BILATERAL MYRINGOTOMY AND TUBES      Current Medications: Current Meds  Medication Sig   acetaminophen (TYLENOL) 325 MG tablet Take 2 tablets (650 mg total) by mouth every 6 (six) hours as needed for mild pain (or Fever >/= 101).   busPIRone (BUSPAR) 15 MG tablet Take 1 tablet (15 mg total) by mouth 2 (two) times daily.   digoxin (LANOXIN) 0.125 MG tablet Take 0.125 mg by mouth daily.   ezetimibe (ZETIA) 10 MG tablet Take 1 tablet (10 mg total) by mouth daily.   fluticasone (FLONASE) 50 MCG/ACT nasal spray Place 2 sprays into  both nostrils daily. (Patient taking differently: Place 2 sprays into both nostrils as needed for allergies.)   guaifenesin (HUMIBID E) 400 MG TABS tablet Take 400 mg by mouth every 6 (six) hours as needed (allergies/ mucus).   ipratropium (ATROVENT) 0.03 % nasal spray Place 2 sprays into both nostrils 2 (two) times daily.   lansoprazole (PREVACID) 30 MG capsule TAKE 1 CAPSULE BY MOUTH EVERY DAY (Patient taking differently: Take 30 mg by mouth daily at 12 noon.)   levothyroxine (SYNTHROID) 50 MCG tablet TAKE 1 TABLET (50 MCG TOTAL) BY MOUTH DAILY. 30 MINUTES PRIOR TO EATING.   loratadine (CLARITIN) 10  MG tablet Take 10 mg by mouth daily as needed for allergies.   Multiple Vitamins-Minerals (EQ MULTIVITAMINS ADULT GUMMY) CHEW Chew 1 tablet by mouth in the morning. Unknown strength   Polyethyl Glycol-Propyl Glycol (SYSTANE) 0.4-0.3 % SOLN Apply 1 drop to eye as needed. (Patient taking differently: Apply 1 drop to eye as needed (dryness).)   potassium chloride (MICRO-K) 10 MEQ CR capsule TAKE 1 CAPSULE BY MOUTH EVERY DAY (Patient taking differently: Take 10 mEq by mouth daily.)   saccharomyces boulardii (FLORASTOR) 250 MG capsule Take 30 mg by mouth daily.   trimethoprim (TRIMPEX) 100 MG tablet Take 100 mg by mouth daily.   venlafaxine XR (EFFEXOR-XR) 150 MG 24 hr capsule TAKE 1 CAPSULE BY MOUTH EVERY DAY FOR MOOD (Patient taking differently: Take 150 mg by mouth daily with breakfast.)     Allergies:   Amoxicillin, Augmentin [amoxicillin-pot clavulanate], Drug [tape], Sulfa antibiotics, Atenolol, and Macrodantin [nitrofurantoin]   Social History   Socioeconomic History   Marital status: Widowed    Spouse name: Not on file   Number of children: Not on file   Years of education: Not on file   Highest education level: Not on file  Occupational History   Not on file  Tobacco Use   Smoking status: Never   Smokeless tobacco: Never  Vaping Use   Vaping Use: Never used  Substance and Sexual Activity   Alcohol use: Not Currently    Comment: Rare social drinking   Drug use: Never   Sexual activity: Not Currently  Other Topics Concern   Not on file  Social History Narrative   Not on file   Social Determinants of Health   Financial Resource Strain: Low Risk    Difficulty of Paying Living Expenses: Not hard at all  Food Insecurity: No Food Insecurity   Worried About Charity fundraiser in the Last Year: Never true   [place] in the Last Year: Never true  Transportation Needs: No Transportation Needs   Lack of Transportation (Medical): No   Lack of Transportation (Non-Medical):  No  Physical Activity: Inactive   Days of Exercise per Week: 0 days   Minutes of Exercise per Session: 0 min  Stress: No Stress Concern Present   Feeling of Stress : Not at all  Social Connections: Moderately Isolated   Frequency of Communication with Friends and Family: More than three times a week   Frequency of Social Gatherings with Friends and Family: Once a week   Attends Religious Services: 1 to 4 times per year   Active Member of [employer] or Organizations: No   Attends Archivist Meetings: Never   Marital Status: Widowed     Family History: The patient's family history includes Colon cancer in her mother; Heart attack in her father. ROS:   Please see the history of  present illness.    All 14 point review of systems negative except as described per history of present illness  EKGs/Labs/Other Studies Reviewed:      Recent Labs: 05/14/2020: B Natriuretic Peptide 100.4 05/20/2020: Magnesium 1.8 06/10/2020: TSH 5.98 07/15/2020: NT-Pro BNP 558 08/25/2020: ALT 12; BUN 24; Creatinine 1.30; Hemoglobin 10.4; Platelet Count 193; Potassium 4.2; Sodium 138  Recent Lipid Panel No results found for: CHOL, TRIG, HDL, CHOLHDL, VLDL, LDLCALC, LDLDIRECT  Physical Exam:    VS:  BP (!) 104/58 (BP Location: Right Arm, Patient Position: Sitting)   Pulse 86   Ht '5\' 3"'$  (1.6 m)   Wt 159 lb (72.1 kg)   SpO2 94%   BMI 28.17 kg/m     Wt Readings from Last 3 Encounters:  09/29/20 159 lb (72.1 kg)  09/15/20 159 lb (72.1 kg)  08/25/20 160 lb 4.8 oz (72.7 kg)     GEN:  Well nourished, well developed in no acute distress HEENT: Normal NECK: No JVD; No carotid bruits LYMPHATICS: No lymphadenopathy CARDIAC: RRR, no murmurs, no rubs, no gallops RESPIRATORY:  Clear to auscultation without rales, wheezing or rhonchi  ABDOMEN: Soft, non-tender, non-distended MUSCULOSKELETAL:  No edema; No deformity  SKIN: Warm and dry LOWER EXTREMITIES: no swelling NEUROLOGIC:  Alert and oriented x  3 PSYCHIATRIC:  Normal affect   ASSESSMENT:    1. Coronary artery disease involving native coronary artery of native heart without angina pectoris   2. Elevated cholesterol   3. CLL (chronic lymphocytic leukemia) ([place])   4. Hypokalemia   5. Primary hypertension    PLAN:    In order of problems listed above:  Coronary artery disease, stable from that point review denies have any symptoms suggesting reactivation of the problem.  I was thinking about potentially doing stress test to rule out significant ischemia however her symptomatology does not suggest reactivation of the problem.  Therefore we will continue present management. Multiple joint aches not much stiffness in the morning.  It would be rather unusual to develop rheumatoid problem up to her age.  However I will check her C-reactive protein and sed rate.  I explained to them what the purpose of this test is and I told him if it is negative for many normal chances for having some inflammatory process a very low however with overall her poor condition and multiple issue going on a morning dose test became very abnormal that does not help with any diagnosis in this situation.  She may benefit from seeing rheumatologist. Essential hypertension blood pressure well controlled continue present management. Overall it is a difficult situation.  She is struggling and honestly I cannot pinpoint any cardiac explanation for her symptomatology.  She requested to have her uric acid rechecked again since it was elevated last time which I will do for her.  I also check a complete metabolic panel   Medication Adjustments/Labs and Tests Ordered: Current medicines are reviewed at length with the patient today.  Concerns regarding medicines are outlined above.  No orders of the defined types were placed in this encounter.  Medication changes: No orders of the defined types were placed in this encounter.   Signed, Park Liter, MD,  [facility] 09/29/2020 2:00 PM    [place]

## 2020-09-29 NOTE — Patient Instructions (Signed)
Medication Instructions:  Your physician recommends that you continue on your current medications as directed. Please refer to the Current Medication list given to you today.  *If you need a refill on your cardiac medications before your next appointment, please call your pharmacy*   Lab Work: Your physician recommends that you return for lab work in:  TODAY: Uric acid, CMP If you have labs (blood work) drawn today and your tests are completely normal, you will receive your results only by: [place] (if you have MyChart) OR A paper copy in the mail If you have any lab test that is abnormal or we need to change your treatment, we will call you to review the results.   Testing/Procedures: None   Follow-Up: At [facility], you and your health needs are our priority.  As part of our continuing mission to provide you with exceptional heart care, we have created designated Provider Care Teams.  These Care Teams include your primary Cardiologist (physician) and Advanced Practice Providers (APPs -  Physician Assistants and Nurse Practitioners) who all work together to provide you with the care you need, when you need it.  We recommend signing up for the patient portal called "MyChart".  Sign up information is provided on this After Visit Summary.  MyChart is used to connect with patients for Virtual Visits (Telemedicine).  Patients are able to view lab/test results, encounter notes, upcoming appointments, etc.  Non-urgent messages can be sent to your provider as well.   To learn more about what you can do with MyChart, go to [email or web address].    Your next appointment:   5 month(s)  The format for your next appointment:   In Person  Provider:   Jenne Campus, MD   Other Instructions

## 2020-09-30 LAB — COMPREHENSIVE METABOLIC PANEL
ALT: 9 IU/L (ref 0–32)
AST: 14 IU/L (ref 0–40)
Albumin/Globulin Ratio: 2.6 — ABNORMAL HIGH (ref 1.2–2.2)
Albumin: 3.7 g/dL (ref 3.7–4.7)
Alkaline Phosphatase: 138 IU/L — ABNORMAL HIGH (ref 44–121)
BUN/Creatinine Ratio: 19 (ref 12–28)
BUN: 24 mg/dL (ref 8–27)
Bilirubin Total: 0.3 mg/dL (ref 0.0–1.2)
CO2: 17 mmol/L — ABNORMAL LOW (ref 20–29)
Calcium: 8.8 mg/dL (ref 8.7–10.3)
Chloride: 107 mmol/L — ABNORMAL HIGH (ref 96–106)
Creatinine, Ser: 1.28 mg/dL — ABNORMAL HIGH (ref 0.57–1.00)
Globulin, Total: 1.4 g/dL — ABNORMAL LOW (ref 1.5–4.5)
Glucose: 137 mg/dL — ABNORMAL HIGH (ref 65–99)
Potassium: 4.4 mmol/L (ref 3.5–5.2)
Sodium: 141 mmol/L (ref 134–144)
Total Protein: 5.1 g/dL — ABNORMAL LOW (ref 6.0–8.5)
eGFR: 42 mL/min/{1.73_m2} — ABNORMAL LOW (ref >59)

## 2020-09-30 LAB — URIC ACID: Uric Acid: 5.8 mg/dL (ref 3.1–7.9)

## 2020-10-01 ENCOUNTER — Telehealth (INDEPENDENT_AMBULATORY_CARE_PROVIDER_SITE_OTHER): Payer: Medicare Other | Admitting: Family Medicine

## 2020-10-01 ENCOUNTER — Ambulatory Visit: Payer: Medicare Other | Admitting: Family Medicine

## 2020-10-01 ENCOUNTER — Encounter: Payer: Self-pay | Admitting: Family Medicine

## 2020-10-01 VITALS — Ht 63.0 in

## 2020-10-01 DIAGNOSIS — M2550 Pain in unspecified joint: Secondary | ICD-10-CM

## 2020-10-01 MED ORDER — HYDROCODONE-ACETAMINOPHEN 5-325 MG PO TABS: 1.0000 | ORAL_TABLET | Freq: Four times a day (QID) | ORAL | 0 refills | Status: DC | PRN

## 2020-10-01 NOTE — Progress Notes (Signed)
Established Patient Office Visit  Subjective:  Patient ID: Sarah [name], female    DOB: 17-Nov-1940  Age: 80 y.o. MRN: [id number]  CC:  Chief Complaint  Patient presents with   Fever    Fever, body aches.     HPI [name] presents for with a 3 week history of arthralgias. Temperature has been elevated she says for the last few weeks. Has been taking tylenol. Denies URI symptoms. Taking tylenol with little relief of pain.  Was seen by ophthalmology 3 weeks ago for conjunctivitis.  That seems to have improved.  Was given doxycycline and that it caused diarrhea.  She discontinued the doxycycline and her diarrhea resolved.  She is having significant joint pains in her hands shoulders and neck.  NSAID use not advised with depressed renal function.  Past Medical History:  Diagnosis Date   Acute cystitis with hematuria 12/15/2019   AKI (acute kidney injury) ([place]) 05/06/2020   Allergic rhinitis due to pollen 12/08/2019   Allergy    Anxiety    Arthritis    CLL (chronic lymphocytic leukemia) (HCC)    Coronary artery disease 06/09/2020   Dyspnea    continuos   Elevated cholesterol 12/08/2019   Elevated TSH 06/10/2020   Fatigue 06/10/2020   GERD (gastroesophageal reflux disease)    Gout    History of gout 12/08/2019   History of heart attack    History of MI (myocardial infarction) 12/08/2019   History of myocardial infarction 12/08/2019   HLD (hyperlipidemia)    Hospital discharge follow-up 05/25/2020   Hypertension    Hypokalemia 05/25/2020   Hypotension 05/06/2020   Macrocytic anemia 12/15/2019   Non Hodgkin's lymphoma ([place])    Other general symptoms and signs  06/10/2020   Urinary frequency 12/08/2019   UTI (urinary tract infection) 05/06/2020    Past Surgical History:  Procedure Laterality Date   ABDOMINAL HYSTERECTOMY     APPENDECTOMY     CHOLECYSTECTOMY     CYSTOSCOPY WITH STENT PLACEMENT Left 05/12/2020   Procedure: CYSTOSCOPY WITH STENT PLACEMENT;  Surgeon: Lucas Mallow, MD;  Location: [facility];  Service: Urology;  Laterality: Left;   CYSTOSCOPY/URETEROSCOPY/HOLMIUM LASER/STENT PLACEMENT Left 06/23/2020   Procedure: CYSTOSCOPY LEFT diagnostic /URETEROSCOPy /STENT  EXCHANGE ureteral BALLOON DILATION;  Surgeon: Lucas Mallow, MD;  Location[facility];  Service: Urology;  Laterality: Left;  REQUESTING 1 HR   Double masectomy     KIDNEY SURGERY     MOHS SURGERY     TONSILECTOMY, ADENOIDECTOMY, BILATERAL MYRINGOTOMY AND TUBES      Family History  Problem Relation Age of Onset   Colon cancer Mother    Heart attack Father     Social History   Socioeconomic History   Marital status: Widowed    Spouse name: Not on file   Number of children: Not on file   Years of education: Not on file   Highest education level: Not on file  Occupational History   Not on file  Tobacco Use   Smoking status: Never   Smokeless tobacco: Never  Vaping Use   Vaping Use: Never used  Substance and Sexual Activity   Alcohol use: Not Currently    Comment: Rare social drinking   Drug use: Never   Sexual activity: Not Currently  Other Topics Concern   Not on file  Social History Narrative   Not on file   Social Determinants of Health   Financial Resource Strain: Low Risk  Difficulty of Paying Living Expenses: Not hard at all  Food Insecurity: No Food Insecurity   Worried About [place] in the Last Year: Never true   Ran Out of Food in the Last Year: Never true  Transportation Needs: No Transportation Needs   Lack of Transportation (Medical): No   Lack of Transportation (Non-Medical): No  Physical Activity: Inactive   Days of Exercise per Week: 0 days   Minutes of Exercise per Session: 0 min  Stress: No Stress Concern Present   Feeling of Stress : Not at all  Social Connections: Moderately Isolated   Frequency of Communication with Friends and Family: More than three times a week   Frequency of Social Gatherings with Friends and Family: Once  a week   Attends Religious Services: 1 to 4 times per year   Active Member of [employer] or Organizations: No   Attends Archivist Meetings: Never   Marital Status: Widowed  Human resources officer Violence: Not At Risk   Fear of Current or Ex-Partner: No   Emotionally Abused: No   Physically Abused: No   Sexually Abused: No    Outpatient Medications Prior to Visit  Medication Sig Dispense Refill   busPIRone (BUSPAR) 15 MG tablet Take 1 tablet (15 mg total) by mouth 2 (two) times daily. 180 tablet 1   ezetimibe (ZETIA) 10 MG tablet Take 1 tablet (10 mg total) by mouth daily. 90 tablet 3   fluticasone (FLONASE) 50 MCG/ACT nasal spray Place 2 sprays into both nostrils daily. (Patient taking differently: Place 2 sprays into both nostrils as needed for allergies.) 16 g 6   guaifenesin (HUMIBID E) 400 MG TABS tablet Take 400 mg by mouth every 6 (six) hours as needed (allergies/ mucus).     ipratropium (ATROVENT) 0.03 % nasal spray Place 2 sprays into both nostrils 2 (two) times daily. 30 mL 0   lansoprazole (PREVACID) 30 MG capsule TAKE 1 CAPSULE BY MOUTH EVERY DAY (Patient taking differently: Take 30 mg by mouth daily at 12 noon.) 30 capsule 0   levothyroxine (SYNTHROID) 50 MCG tablet TAKE 1 TABLET (50 MCG TOTAL) BY MOUTH DAILY. 30 MINUTES PRIOR TO EATING. 90 tablet 0   loratadine (CLARITIN) 10 MG tablet Take 10 mg by mouth daily as needed for allergies.     Multiple Vitamins-Minerals (EQ MULTIVITAMINS ADULT GUMMY) CHEW Chew 1 tablet by mouth in the morning. Unknown strength     Polyethyl Glycol-Propyl Glycol (SYSTANE) 0.4-0.3 % SOLN Apply 1 drop to eye as needed. (Patient taking differently: Apply 1 drop to eye as needed (dryness).) 10 mL 0   potassium chloride (MICRO-K) 10 MEQ CR capsule TAKE 1 CAPSULE BY MOUTH EVERY DAY (Patient taking differently: Take 10 mEq by mouth daily.) 90 capsule 0   saccharomyces boulardii (FLORASTOR) 250 MG capsule Take 30 mg by mouth daily.     trimethoprim  (TRIMPEX) 100 MG tablet Take 100 mg by mouth daily.     venlafaxine XR (EFFEXOR-XR) 150 MG 24 hr capsule TAKE 1 CAPSULE BY MOUTH EVERY DAY FOR MOOD (Patient taking differently: Take 150 mg by mouth daily with breakfast.) 90 capsule 3   acetaminophen (TYLENOL) 325 MG tablet Take 2 tablets (650 mg total) by mouth every 6 (six) hours as needed for mild pain (or Fever >/= 101).     digoxin (LANOXIN) 0.125 MG tablet Take 0.125 mg by mouth daily.     No facility-administered medications prior to visit.    Allergies  Allergen  Reactions   Amoxicillin     GI upset    Augmentin [Amoxicillin-Pot Clavulanate]     GI upset    Drug [Tape]     Irritation    Sulfa Antibiotics     Childhood    Atenolol Rash   Macrodantin [Nitrofurantoin] Rash    ROS Review of Systems  Constitutional:  Positive for fatigue. Negative for chills, diaphoresis, fever and unexpected weight change.  HENT:  Negative for congestion, postnasal drip, rhinorrhea and sore throat.   Eyes:  Negative for photophobia and visual disturbance.  Respiratory:  Negative for cough, chest tightness and wheezing.   Gastrointestinal:  Negative for abdominal pain, constipation and diarrhea.  Genitourinary: Negative.   Musculoskeletal:  Positive for arthralgias and myalgias.  Skin:  Negative for pallor and rash.  Neurological:  Negative for headaches.  Psychiatric/Behavioral: Negative.       Objective:    Physical Exam Vitals and nursing note reviewed.  Constitutional:      Appearance: Normal appearance.  HENT:     Head: Normocephalic and atraumatic.  Pulmonary:     Effort: Pulmonary effort is normal.  Neurological:     Mental Status: She is alert and oriented to person, place, and time.  Psychiatric:        Mood and Affect: Mood normal.        Behavior: Behavior normal.    Ht 5' 3"  (1.6 m)   BMI 28.17 kg/m  Wt Readings from Last 3 Encounters:  09/29/20 159 lb (72.1 kg)  09/15/20 159 lb (72.1 kg)  08/25/20 160 lb 4.8  oz (72.7 kg)     Health Maintenance Due  Topic Date Due   TETANUS/TDAP  Never done   Zoster Vaccines- Shingrix (1 of 2) Never done   DEXA SCAN  Never done   PNA vac Low Risk Adult (1 of 2 - PCV13) Never done   INFLUENZA VACCINE  09/20/2020    There are no preventive care reminders to display for this patient.  Lab Results  Component Value Date   TSH 5.98 (H) 06/10/2020   Lab Results  Component Value Date   WBC 52.0 (HH) 08/25/2020   HGB 10.4 (L) 08/25/2020   HCT 32.9 (L) 08/25/2020   MCV 103.8 (H) 08/25/2020   PLT 193 08/25/2020   Lab Results  Component Value Date   NA 141 09/29/2020   K 4.4 09/29/2020   CO2 17 (L) 09/29/2020   GLUCOSE 137 (H) 09/29/2020   BUN 24 09/29/2020   CREATININE 1.28 (H) 09/29/2020   BILITOT 0.3 09/29/2020   ALKPHOS 138 (H) 09/29/2020   AST 14 09/29/2020   ALT 9 09/29/2020   PROT 5.1 (L) 09/29/2020   ALBUMIN 3.7 09/29/2020   CALCIUM 8.8 09/29/2020   ANIONGAP 11 08/25/2020   EGFR 42 (L) 09/29/2020   GFR 40.66 (L) 08/11/2020   No results found for: CHOL No results found for: HDL No results found for: LDLCALC No results found for: TRIG No results found for: CHOLHDL No results found for: HGBA1C    Assessment & Plan:   Problem List Items Addressed This Visit       Other   Arthralgia - Primary   Relevant Medications   HYDROcodone-acetaminophen (NORCO) 5-325 MG tablet    Meds ordered this encounter  Medications   HYDROcodone-acetaminophen (NORCO) 5-325 MG tablet    Sig: Take 1 tablet by mouth every 6 (six) hours as needed for moderate pain.    Dispense:  20 tablet  Refill:  0     Follow-up: Return We will follow-up in the clinic here with me this coming week at some point..  Urgent care evaluation with sustained fevers over 100.5 not responding to Tylenol.  Between above Norco and Tylenol, no more than 3000 mg of Tylenol daily.  Libby Maw, MD  Virtual Visit via Video Note  I connected with [name]  on 10/01/20 at  4:15 PM EDT by a video enabled telemedicine application and verified that I am speaking with the correct person using two identifiers.  Location: Patient: at home with her daughter, [name].  Provider: work   I discussed the limitations of evaluation and management by telemedicine and the availability of in person appointments. The patient expressed understanding and agreed to proceed.  History of Present Illness:    Observations/Objective:   Assessment and Plan:   Follow Up Instructions:    I discussed the assessment and treatment plan with the patient. The patient was provided an opportunity to ask questions and all were answered. The patient agreed with the plan and demonstrated an understanding of the instructions.   The patient was advised to call back or seek an in-person evaluation if the symptoms worsen or if the condition fails to improve as anticipated.  I provided 25  minutes of non-face-to-face time during this encounter.   Libby Maw, MD

## 2020-10-03 ENCOUNTER — Emergency Department (HOSPITAL_COMMUNITY): Payer: Medicare Other

## 2020-10-03 ENCOUNTER — Inpatient Hospital Stay (HOSPITAL_COMMUNITY)
Admission: EM | Admit: 2020-10-03 | Discharge: 2020-10-08 | DRG: 690 | Disposition: A | Discharge status: Skilled Nursing Facility | Payer: Medicare Other | Attending: Family Medicine | Admitting: Family Medicine

## 2020-10-03 ENCOUNTER — Encounter (HOSPITAL_COMMUNITY): Payer: Self-pay

## 2020-10-03 DIAGNOSIS — M0239 Reiter's disease, multiple sites: Secondary | ICD-10-CM | POA: Diagnosis present

## 2020-10-03 DIAGNOSIS — E039 Hypothyroidism, unspecified: Secondary | ICD-10-CM | POA: Diagnosis present

## 2020-10-03 DIAGNOSIS — Z882 Allergy status to sulfonamides status: Secondary | ICD-10-CM

## 2020-10-03 DIAGNOSIS — E7800 Pure hypercholesterolemia, unspecified: Secondary | ICD-10-CM | POA: Diagnosis present

## 2020-10-03 DIAGNOSIS — R197 Diarrhea, unspecified: Secondary | ICD-10-CM | POA: Diagnosis present

## 2020-10-03 DIAGNOSIS — Z888 Allergy status to other drugs, medicaments and biological substances status: Secondary | ICD-10-CM

## 2020-10-03 DIAGNOSIS — Z20822 Contact with and (suspected) exposure to covid-19: Secondary | ICD-10-CM | POA: Diagnosis present

## 2020-10-03 DIAGNOSIS — Z853 Personal history of malignant neoplasm of breast: Secondary | ICD-10-CM

## 2020-10-03 DIAGNOSIS — Z91048 Other nonmedicinal substance allergy status: Secondary | ICD-10-CM

## 2020-10-03 DIAGNOSIS — C9110 Chronic lymphocytic leukemia of B-cell type not having achieved remission: Secondary | ICD-10-CM

## 2020-10-03 DIAGNOSIS — C9590 Leukemia, unspecified not having achieved remission: Secondary | ICD-10-CM | POA: Diagnosis present

## 2020-10-03 DIAGNOSIS — K219 Gastro-esophageal reflux disease without esophagitis: Secondary | ICD-10-CM | POA: Diagnosis present

## 2020-10-03 DIAGNOSIS — Z883 Allergy status to other anti-infective agents status: Secondary | ICD-10-CM

## 2020-10-03 DIAGNOSIS — R509 Fever, unspecified: Secondary | ICD-10-CM

## 2020-10-03 DIAGNOSIS — N136 Pyonephrosis: Secondary | ICD-10-CM | POA: Diagnosis not present

## 2020-10-03 DIAGNOSIS — R32 Unspecified urinary incontinence: Secondary | ICD-10-CM | POA: Diagnosis present

## 2020-10-03 DIAGNOSIS — Z9013 Acquired absence of bilateral breasts and nipples: Secondary | ICD-10-CM

## 2020-10-03 DIAGNOSIS — M160 Bilateral primary osteoarthritis of hip: Secondary | ICD-10-CM | POA: Diagnosis present

## 2020-10-03 DIAGNOSIS — Z880 Allergy status to penicillin: Secondary | ICD-10-CM

## 2020-10-03 DIAGNOSIS — A419 Sepsis, unspecified organism: Secondary | ICD-10-CM

## 2020-10-03 DIAGNOSIS — I252 Old myocardial infarction: Secondary | ICD-10-CM

## 2020-10-03 DIAGNOSIS — I10 Essential (primary) hypertension: Secondary | ICD-10-CM | POA: Diagnosis present

## 2020-10-03 DIAGNOSIS — Z79890 Hormone replacement therapy (postmenopausal): Secondary | ICD-10-CM

## 2020-10-03 DIAGNOSIS — M2550 Pain in unspecified joint: Secondary | ICD-10-CM | POA: Diagnosis present

## 2020-10-03 DIAGNOSIS — N390 Urinary tract infection, site not specified: Secondary | ICD-10-CM | POA: Diagnosis present

## 2020-10-03 DIAGNOSIS — Z8249 Family history of ischemic heart disease and other diseases of the circulatory system: Secondary | ICD-10-CM

## 2020-10-03 DIAGNOSIS — M109 Gout, unspecified: Secondary | ICD-10-CM | POA: Diagnosis present

## 2020-10-03 DIAGNOSIS — D649 Anemia, unspecified: Secondary | ICD-10-CM | POA: Diagnosis present

## 2020-10-03 DIAGNOSIS — Z79899 Other long term (current) drug therapy: Secondary | ICD-10-CM

## 2020-10-03 DIAGNOSIS — I2510 Atherosclerotic heart disease of native coronary artery without angina pectoris: Secondary | ICD-10-CM | POA: Diagnosis present

## 2020-10-03 DIAGNOSIS — E785 Hyperlipidemia, unspecified: Secondary | ICD-10-CM | POA: Diagnosis present

## 2020-10-03 DIAGNOSIS — Z66 Do not resuscitate: Secondary | ICD-10-CM | POA: Diagnosis present

## 2020-10-03 DIAGNOSIS — F32A Depression, unspecified: Secondary | ICD-10-CM | POA: Diagnosis present

## 2020-10-03 DIAGNOSIS — M0230 Reiter's disease, unspecified site: Secondary | ICD-10-CM

## 2020-10-03 LAB — CBC WITH DIFFERENTIAL/PLATELET
Abs Immature Granulocytes: 0.2 10*3/uL — ABNORMAL HIGH (ref 0.00–0.07)
Basophils Absolute: 0 10*3/uL (ref 0.0–0.1)
Basophils Relative: 0 %
Eosinophils Absolute: 0.6 10*3/uL — ABNORMAL HIGH (ref 0.0–0.5)
Eosinophils Relative: 1 %
HCT: 34.7 % — ABNORMAL LOW (ref 36.0–46.0)
Hemoglobin: 10.7 g/dL — ABNORMAL LOW (ref 12.0–15.0)
Immature Granulocytes: 0 %
Lymphocytes Relative: 95 %
Lymphs Abs: 58.9 10*3/uL — ABNORMAL HIGH (ref 0.7–4.0)
MCH: 31.5 pg (ref 26.0–34.0)
MCHC: 30.8 g/dL (ref 30.0–36.0)
MCV: 102.1 fL — ABNORMAL HIGH (ref 80.0–100.0)
Monocytes Absolute: 0.7 10*3/uL (ref 0.1–1.0)
Monocytes Relative: 1 %
Neutro Abs: 1.8 10*3/uL (ref 1.7–7.7)
Neutrophils Relative %: 3 %
Platelets: 277 10*3/uL (ref 150–400)
RBC: 3.4 MIL/uL — ABNORMAL LOW (ref 3.87–5.11)
RDW: 16.7 % — ABNORMAL HIGH (ref 11.5–15.5)
WBC Morphology: ABNORMAL
WBC: 62.2 10*3/uL (ref 4.0–10.5)
nRBC: 0 % (ref 0.0–0.2)

## 2020-10-03 LAB — URINALYSIS, ROUTINE W REFLEX MICROSCOPIC
Bilirubin Urine: NEGATIVE
Glucose, UA: NEGATIVE mg/dL
Hgb urine dipstick: NEGATIVE
Ketones, ur: NEGATIVE mg/dL
Nitrite: NEGATIVE
Protein, ur: 30 mg/dL — AB
Specific Gravity, Urine: 1.014 (ref 1.005–1.030)
WBC, UA: >50 WBC/hpf — ABNORMAL HIGH (ref 0–5)
pH: 5 (ref 5.0–8.0)

## 2020-10-03 LAB — COMPREHENSIVE METABOLIC PANEL
ALT: 19 U/L (ref 0–44)
AST: 32 U/L (ref 15–41)
Albumin: 3.4 g/dL — ABNORMAL LOW (ref 3.5–5.0)
Alkaline Phosphatase: 130 U/L — ABNORMAL HIGH (ref 38–126)
Anion gap: 10 (ref 5–15)
BUN: 33 mg/dL — ABNORMAL HIGH (ref 8–23)
CO2: 22 mmol/L (ref 22–32)
Calcium: 9.1 mg/dL (ref 8.9–10.3)
Chloride: 104 mmol/L (ref 98–111)
Creatinine, Ser: 1.17 mg/dL — ABNORMAL HIGH (ref 0.44–1.00)
GFR, Estimated: 47 mL/min — ABNORMAL LOW (ref >60)
Glucose, Bld: 118 mg/dL — ABNORMAL HIGH (ref 70–99)
Potassium: 5.4 mmol/L — ABNORMAL HIGH (ref 3.5–5.1)
Sodium: 136 mmol/L (ref 135–145)
Total Bilirubin: 0.4 mg/dL (ref 0.3–1.2)
Total Protein: 6.2 g/dL — ABNORMAL LOW (ref 6.5–8.1)

## 2020-10-03 LAB — SEDIMENTATION RATE: Sed Rate: 42 mm/hr — ABNORMAL HIGH (ref 0–22)

## 2020-10-03 LAB — BLOOD GAS, VENOUS
Acid-base deficit: 1.8 mmol/L (ref 0.0–2.0)
Bicarbonate: 22.8 mmol/L (ref 20.0–28.0)
O2 Saturation: 89.7 %
Patient temperature: 98.6
pCO2, Ven: 40.6 mmHg — ABNORMAL LOW (ref 44.0–60.0)
pH, Ven: 7.368 (ref 7.250–7.430)
pO2, Ven: 63.4 mmHg — ABNORMAL HIGH (ref 32.0–45.0)

## 2020-10-03 LAB — LACTIC ACID, PLASMA: Lactic Acid, Venous: 1.2 mmol/L (ref 0.5–1.9)

## 2020-10-03 MED ORDER — SODIUM CHLORIDE 0.9 % IV SOLN
1.0000 g | Freq: Once | INTRAVENOUS | Status: AC
  Administered 2020-10-04: 1 g via INTRAVENOUS
  Filled 2020-10-03: qty 10

## 2020-10-03 MED ORDER — HYDROCODONE-ACETAMINOPHEN 5-325 MG PO TABS
1.0000 | ORAL_TABLET | Freq: Once | ORAL | Status: AC
  Administered 2020-10-03: 1 via ORAL
  Filled 2020-10-03: qty 1

## 2020-10-03 MED ORDER — SODIUM CHLORIDE 0.9 % IV BOLUS
500.0000 mL | Freq: Once | INTRAVENOUS | Status: AC
  Administered 2020-10-03: 500 mL via INTRAVENOUS

## 2020-10-03 NOTE — ED Provider Notes (Addendum)
[facility] DEPT Provider Note   CSN: [id number] Arrival date & time: 10/03/20  1810     History Chief Complaint  Patient presents with   Fever    Sarah [name] is a 80 y.o. female.  Patient is an 80 yo female with pmh of breast cancer in remission and CLL presenting for complaints of fever. Patient admits to intermittent fevers with a Tmax of 100.53F orally x 3 weeks. Admits to eye infection 3 weeks ago in which she took doxycyline x 5 days before she discontinued use due to side effects under the care of her eye specialist. States all ocular symptoms have resolved at this time. Currently patient admits to joint pain in all joints. Denies nausea, vomiting, diarrhea, or abdominal pain. Pt also admits to urinary incontinence with hx of multiple infections and urethra stents. Patient denies any dysuria at this time. No current stents.     The history is provided by the patient. No language interpreter was used.  Fever Max temp prior to arrival:  100.4 Temp source:  Oral Severity:  Moderate Onset quality:  Gradual Duration:  3 weeks Associated symptoms: no chest pain, no chills, no cough, no dysuria, no ear pain, no rash, no sore throat and no vomiting       Past Medical History:  Diagnosis Date   Acute cystitis with hematuria 12/15/2019   AKI (acute kidney injury) ([place]) 05/06/2020   Allergic rhinitis due to pollen 12/08/2019   Allergy    Anxiety    Arthritis    CLL (chronic lymphocytic leukemia) ([place])    Coronary artery disease 06/09/2020   Dyspnea    continuos   Elevated cholesterol 12/08/2019   Elevated TSH 06/10/2020   Fatigue 06/10/2020   GERD (gastroesophageal reflux disease)    Gout    History of gout 12/08/2019   History of heart attack    History of MI (myocardial infarction) 12/08/2019   History of myocardial infarction 12/08/2019   HLD (hyperlipidemia)    Hospital discharge follow-up 05/25/2020   Hypertension    Hypokalemia  05/25/2020   Hypotension 05/06/2020   Macrocytic anemia 12/15/2019   Non Hodgkin's lymphoma ([place])    Other general symptoms and signs  06/10/2020   Urinary frequency 12/08/2019   UTI (urinary tract infection) 05/06/2020    Patient Active Problem List   Diagnosis Date Noted   Arthralgia 10/01/2020   Elevated temperature 08/11/2020   Skin lesion of back 08/11/2020   Right upper quadrant abdominal pain 08/11/2020   Hypertension    Abnormal vision of both eyes 07/19/2020   Pain aggravated by walking 07/19/2020   Memory difficulties 07/18/2020   Non Hodgkin's lymphoma ([place])    History of heart attack    Dyspnea    Arthritis    Anxiety    Elevated TSH 06/10/2020   Fatigue 06/10/2020   Other general symptoms and signs  06/10/2020   Coronary artery disease 06/09/2020   HLD (hyperlipidemia)    Gout    GERD (gastroesophageal reflux disease)    Allergy    Hospital discharge follow-up 05/25/2020   Hypokalemia 05/25/2020   AKI (acute kidney injury) ([place]) 05/06/2020   UTI (urinary tract infection) 05/06/2020   Hypotension 05/06/2020   Other specified anemias 12/15/2019   Acute cystitis with hematuria 12/15/2019   Macrocytic anemia 12/15/2019   Allergic rhinitis due to pollen 12/08/2019   Elevated cholesterol 12/08/2019   History of myocardial infarction 12/08/2019   Urinary frequency 12/08/2019  CLL (chronic lymphocytic leukemia) ([place]) 12/08/2019   History of gout 12/08/2019   History of MI (myocardial infarction) 12/08/2019    Past Surgical History:  Procedure Laterality Date   ABDOMINAL HYSTERECTOMY     APPENDECTOMY     CHOLECYSTECTOMY     CYSTOSCOPY WITH STENT PLACEMENT Left 05/12/2020   Procedure: CYSTOSCOPY WITH STENT PLACEMENT;  Surgeon: Lucas Mallow, MD;  Location: [facility];  Service: Urology;  Laterality: Left;   CYSTOSCOPY/URETEROSCOPY/HOLMIUM LASER/STENT PLACEMENT Left 06/23/2020   Procedure: CYSTOSCOPY LEFT diagnostic /URETEROSCOPy /STENT  EXCHANGE ureteral BALLOON  DILATION;  Surgeon: Lucas Mallow, MD;  Location: [facility];  Service: Urology;  Laterality: Left;  REQUESTING 1 HR   Double masectomy     KIDNEY SURGERY     MOHS SURGERY     TONSILECTOMY, ADENOIDECTOMY, BILATERAL MYRINGOTOMY AND TUBES       OB History   No obstetric history on file.     Family History  Problem Relation Age of Onset   Colon cancer Mother    Heart attack Father     Social History   Tobacco Use   Smoking status: Never   Smokeless tobacco: Never  Vaping Use   Vaping Use: Never used  Substance Use Topics   Alcohol use: Not Currently    Comment: Rare social drinking   Drug use: Never    Home Medications Prior to Admission medications   Medication Sig Start Date End Date Taking? Authorizing Provider  busPIRone (BUSPAR) 15 MG tablet Take 1 tablet (15 mg total) by mouth 2 (two) times daily. 07/15/20   Libby Maw, MD  digoxin (LANOXIN) 0.125 MG tablet Take 0.125 mg by mouth daily. 09/20/20   [provider]  ezetimibe (ZETIA) 10 MG tablet Take 1 tablet (10 mg total) by mouth daily. 07/02/20   Richardo Priest, MD  fluticasone (FLONASE) 50 MCG/ACT nasal spray Place 2 sprays into both nostrils daily. Patient taking differently: Place 2 sprays into both nostrils as needed for allergies. 12/08/19   Libby Maw, MD  guaifenesin (HUMIBID E) 400 MG TABS tablet Take 400 mg by mouth every 6 (six) hours as needed (allergies/ mucus).    [provider]  HYDROcodone-acetaminophen (NORCO) 5-325 MG tablet Take 1 tablet by mouth every 6 (six) hours as needed for moderate pain. 10/01/20   Libby Maw, MD  ipratropium (ATROVENT) 0.03 % nasal spray Place 2 sprays into both nostrils 2 (two) times daily. 09/15/20   Nche, Charlene Brooke, NP  lansoprazole (PREVACID) 30 MG capsule TAKE 1 CAPSULE BY MOUTH EVERY DAY Patient taking differently: Take 30 mg by mouth daily at 12 noon. 09/28/20   Libby Maw, MD  levothyroxine (SYNTHROID)  50 MCG tablet TAKE 1 TABLET (50 MCG TOTAL) BY MOUTH DAILY. [place]. 09/13/20   Libby Maw, MD  loratadine (CLARITIN) 10 MG tablet Take 10 mg by mouth daily as needed for allergies.    [provider]  Multiple Vitamins-Minerals (EQ MULTIVITAMINS ADULT GUMMY) CHEW Chew 1 tablet by mouth in the morning. Unknown strength    [provider]  Polyethyl Glycol-Propyl Glycol (SYSTANE) 0.4-0.3 % SOLN Apply 1 drop to eye as needed. Patient taking differently: Apply 1 drop to eye as needed (dryness). 09/15/20   Nche, Charlene Brooke, NP  potassium chloride (MICRO-K) 10 MEQ CR capsule TAKE 1 CAPSULE BY MOUTH EVERY DAY Patient taking differently: Take 10 mEq by mouth daily. 08/02/20   Abelino Derrick  Delrae Alfred, MD  saccharomyces boulardii (FLORASTOR) 250 MG capsule Take 30 mg by mouth daily.    [provider]  trimethoprim (TRIMPEX) 100 MG tablet Take 100 mg by mouth daily. 07/09/20   [provider]  venlafaxine XR (EFFEXOR-XR) 150 MG 24 hr capsule TAKE 1 CAPSULE BY MOUTH EVERY DAY FOR MOOD Patient taking differently: Take 150 mg by mouth daily with breakfast. 07/29/20   Libby Maw, MD  metoprolol tartrate (LOPRESSOR) 25 MG tablet Take 25 mg by mouth 2 (two) times daily.  01/05/20  [provider]    Allergies    Amoxicillin, Augmentin [amoxicillin-pot clavulanate], Drug [tape], Sulfa antibiotics, Atenolol, and Macrodantin [nitrofurantoin]  Review of Systems   Review of Systems  Constitutional:  Positive for fever. Negative for chills.  HENT:  Negative for ear pain and sore throat.   Eyes:  Negative for pain and visual disturbance.  Respiratory:  Negative for cough and shortness of breath.   Cardiovascular:  Negative for chest pain and palpitations.  Gastrointestinal:  Negative for abdominal pain and vomiting.  Genitourinary:  Negative for dysuria and hematuria.  Musculoskeletal:  Positive for arthralgias. Negative for back  pain.  Skin:  Negative for color change and rash.  Neurological:  Negative for seizures and syncope.  All other systems reviewed and are negative.  Physical Exam Updated Vital Signs BP 132/85 (BP Location: Right Arm)   Pulse 95   Temp 98.5 F (36.9 C) (Oral)   Ht '5\' 3"'$  (1.6 m)   Wt 72.1 kg   SpO2 96%   BMI 28.16 kg/m   Physical Exam Vitals and nursing note reviewed.  Constitutional:      General: She is not in acute distress.    Appearance: She is well-developed. She is ill-appearing.  HENT:     Head: Normocephalic and atraumatic.  Eyes:     Conjunctiva/sclera: Conjunctivae normal.  Cardiovascular:     Rate and Rhythm: Normal rate and regular rhythm.     Heart sounds: No murmur heard. Pulmonary:     Effort: Pulmonary effort is normal. No respiratory distress.     Breath sounds: Normal breath sounds.  Abdominal:     Palpations: Abdomen is soft.     Tenderness: There is no abdominal tenderness.  Musculoskeletal:     Cervical back: Neck supple.  Skin:    General: Skin is warm and dry.  Neurological:     Mental Status: She is alert.    ED Results / Procedures / Treatments   Labs (all labs ordered are listed, but only abnormal results are displayed) Labs Reviewed  CBC WITH DIFFERENTIAL/PLATELET - Abnormal; Notable for the following components:      Result Value   WBC 62.2 (*)    RBC 3.40 (*)    Hemoglobin 10.7 (*)    HCT 34.7 (*)    MCV 102.1 (*)    RDW 16.7 (*)    Lymphs Abs 58.9 (*)    Eosinophils Absolute 0.6 (*)    Abs Immature Granulocytes 0.20 (*)    All other components within normal limits  COMPREHENSIVE METABOLIC PANEL  URINALYSIS, ROUTINE W REFLEX MICROSCOPIC  SEDIMENTATION RATE  C-REACTIVE PROTEIN  PATHOLOGIST SMEAR REVIEW    EKG None  Radiology No results found.  Procedures .Critical Care Performed by: Lianne Cure, DO Authorized by: Lianne Cure, DO   Critical care provider statement:    Critical care time (minutes):  37    Critical care was necessary to treat  or prevent imminent or life-threatening deterioration of the following conditions: sepsis of unknown origin.   Critical care was time spent personally by me on the following activities:  Discussions with consultants, evaluation of patient's response to treatment, examination of patient, ordering and performing treatments and interventions, ordering and review of laboratory studies, ordering and review of radiographic studies, pulse oximetry, re-evaluation of patient's condition, obtaining history from patient or surrogate and review of old charts   Care discussed with: admitting provider     Medications Ordered in ED Medications - No data to display  ED Course  I have reviewed the triage vital signs and the nursing notes.  Pertinent labs & imaging results that were available during my care of the patient were reviewed by me and considered in my medical decision making (see chart for details).    MDM Rules/Calculators/A&P                          8:54 PM 80 yo female with pmh of breast cancer in remission and CLL presenting for complaints of fever x 3 week. Pt is Aox3, no acute distress, afebrile, febrile, with otherwise stable vitals. No significant findings on physical exam.   Pt qualifies for sepsis due to fever, rr, and leukocytosis. Blood cx sent. IV fluids and broad spectrum antibiotics given.   -Norco given for pain and fever -Laboratory studies demonstrates leukocytosis of 62 elevated from baseline 40>50 on previous studies.  -Pt denies cough, chest pain, or sob. CXR demonstrates no infiltrates.  -Pt denies abdominal pain, nausea, vomiting, or diarrhea. No abdominal imaging indicated at this time.  -Covid pcr pending -UA pending   11:15 PM Patient signed out to oncoming provider while awaiting rest of work up.     Final Clinical Impression(s) / ED Diagnoses Final diagnoses:  Sepsis, due to unspecified organism, unspecified whether acute  organ dysfunction present ([place])  Fever, unspecified fever cause  History of CLL (chronic lymphocytic leukemia) ([place])    Rx / DC Orders ED Discharge Orders     None        Lianne Cure, DO [id number]    Lianne Cure, DO [id number]

## 2020-10-03 NOTE — ED Provider Notes (Signed)
Emergency Medicine Provider Triage Evaluation Note  Sarah [name] , a 80 y.o. female  was evaluated in triage.  Pt complains of polyarthralgias.  Patient states that about 3 weeks ago she was taking a week of doxycycline for an eye infection.  Afterwards she began developing pain along the feet, ankles, knees, wrists, elbows, shoulders, as well as her neck.  She then began developing low-grade fevers over the past few days.  She states her T-max this morning was 100.4 F on her forehead.  She took hydrocodone this morning and states that her temperature has improved.  Denies any vomiting or diarrhea.  Physical Exam  SpO2 95%  Gen:   Awake, no distress   Resp:  Normal effort  MSK:   Pt unable to ambulate without assistance due to pain. Other:    Medical Decision Making  Medically screening exam initiated at 6:31 PM.  Appropriate orders placed.  Sarah [name] was informed that the remainder of the evaluation will be completed by another provider, this initial triage assessment does not replace that evaluation, and the importance of remaining in the ED until their evaluation is complete.   Rayna Sexton, PA-C 10/03/20 1832    Orpah Greek, MD 10/03/20 [id number]

## 2020-10-03 NOTE — ED Notes (Signed)
Called lab about the blood work. Stated they were still working on it. ETA 15 minutes.

## 2020-10-03 NOTE — ED Triage Notes (Signed)
Pt presents to the ED via EMS for fever for three days. She reports accompanying sx of joint pain and fatigue x3 weeks. Pt's highest fever at home was 100.10F this morning. She has taken Tylenol for her sx alone. Pt with a hx of CLL. Pt is covid vaccinated and denies any recent ill contacts.

## 2020-10-04 ENCOUNTER — Encounter (HOSPITAL_COMMUNITY): Payer: Self-pay | Admitting: Internal Medicine

## 2020-10-04 ENCOUNTER — Emergency Department (HOSPITAL_COMMUNITY): Payer: Medicare Other

## 2020-10-04 DIAGNOSIS — M2550 Pain in unspecified joint: Secondary | ICD-10-CM

## 2020-10-04 DIAGNOSIS — N390 Urinary tract infection, site not specified: Secondary | ICD-10-CM

## 2020-10-04 DIAGNOSIS — M0239 Reiter's disease, multiple sites: Secondary | ICD-10-CM

## 2020-10-04 DIAGNOSIS — M0230 Reiter's disease, unspecified site: Secondary | ICD-10-CM

## 2020-10-04 LAB — CBC WITH DIFFERENTIAL/PLATELET
Abs Immature Granulocytes: 0.07 10*3/uL (ref 0.00–0.07)
Basophils Absolute: 0.1 10*3/uL (ref 0.0–0.1)
Basophils Relative: 0 %
Eosinophils Absolute: 0.6 10*3/uL — ABNORMAL HIGH (ref 0.0–0.5)
Eosinophils Relative: 1 %
HCT: 30.1 % — ABNORMAL LOW (ref 36.0–46.0)
Hemoglobin: 9.2 g/dL — ABNORMAL LOW (ref 12.0–15.0)
Immature Granulocytes: 0 %
Lymphocytes Relative: 95 %
Lymphs Abs: 43.8 10*3/uL — ABNORMAL HIGH (ref 0.7–4.0)
MCH: 31.4 pg (ref 26.0–34.0)
MCHC: 30.6 g/dL (ref 30.0–36.0)
MCV: 102.7 fL — ABNORMAL HIGH (ref 80.0–100.0)
Monocytes Absolute: 0.5 10*3/uL (ref 0.1–1.0)
Monocytes Relative: 1 %
Neutro Abs: 1.4 10*3/uL — ABNORMAL LOW (ref 1.7–7.7)
Neutrophils Relative %: 3 %
Platelets: 191 10*3/uL (ref 150–400)
RBC: 2.93 MIL/uL — ABNORMAL LOW (ref 3.87–5.11)
RDW: 16.7 % — ABNORMAL HIGH (ref 11.5–15.5)
WBC Morphology: ABNORMAL
WBC: 46.4 10*3/uL — ABNORMAL HIGH (ref 4.0–10.5)
nRBC: 0 % (ref 0.0–0.2)

## 2020-10-04 LAB — TSH: TSH: 3.915 u[IU]/mL (ref 0.350–4.500)

## 2020-10-04 LAB — COMPREHENSIVE METABOLIC PANEL
ALT: 15 U/L (ref 0–44)
AST: 24 U/L (ref 15–41)
Albumin: 2.9 g/dL — ABNORMAL LOW (ref 3.5–5.0)
Alkaline Phosphatase: 110 U/L (ref 38–126)
Anion gap: 7 (ref 5–15)
BUN: 30 mg/dL — ABNORMAL HIGH (ref 8–23)
CO2: 24 mmol/L (ref 22–32)
Calcium: 8.5 mg/dL — ABNORMAL LOW (ref 8.9–10.3)
Chloride: 108 mmol/L (ref 98–111)
Creatinine, Ser: 1.1 mg/dL — ABNORMAL HIGH (ref 0.44–1.00)
GFR, Estimated: 51 mL/min — ABNORMAL LOW (ref >60)
Glucose, Bld: 99 mg/dL (ref 70–99)
Potassium: 4.6 mmol/L (ref 3.5–5.1)
Sodium: 139 mmol/L (ref 135–145)
Total Bilirubin: 0.4 mg/dL (ref 0.3–1.2)
Total Protein: 5.4 g/dL — ABNORMAL LOW (ref 6.5–8.1)

## 2020-10-04 LAB — CK: Total CK: 58 U/L (ref 38–234)

## 2020-10-04 LAB — RESP PANEL BY RT-PCR (FLU A&B, COVID) ARPGX2
Influenza A by PCR: NEGATIVE
Influenza B by PCR: NEGATIVE
SARS Coronavirus 2 by RT PCR: NEGATIVE

## 2020-10-04 LAB — URIC ACID: Uric Acid, Serum: 5.7 mg/dL (ref 2.5–7.1)

## 2020-10-04 LAB — C-REACTIVE PROTEIN: CRP: 7.9 mg/dL — ABNORMAL HIGH (ref <1.0)

## 2020-10-04 MED ORDER — SODIUM CHLORIDE 0.9 % IV SOLN
1.0000 g | INTRAVENOUS | Status: DC
  Administered 2020-10-04: 1 g via INTRAVENOUS
  Filled 2020-10-04: qty 10
  Filled 2020-10-04: qty 1

## 2020-10-04 MED ORDER — LEVOTHYROXINE SODIUM 25 MCG PO TABS
25.0000 ug | ORAL_TABLET | Freq: Every day | ORAL | Status: DC
  Administered 2020-10-05 – 2020-10-08 (×4): 25 ug via ORAL
  Filled 2020-10-04 (×4): qty 1

## 2020-10-04 MED ORDER — ACETAMINOPHEN 325 MG PO TABS: 650.0000 mg | ORAL_TABLET | Freq: Four times a day (QID) | ORAL | Status: DC | PRN

## 2020-10-04 MED ORDER — ENOXAPARIN SODIUM 40 MG/0.4ML IJ SOSY
40.0000 mg | PREFILLED_SYRINGE | INTRAMUSCULAR | Status: DC
  Administered 2020-10-05 – 2020-10-08 (×4): 40 mg via SUBCUTANEOUS
  Filled 2020-10-04 (×4): qty 0.4

## 2020-10-04 MED ORDER — PANTOPRAZOLE SODIUM 20 MG PO TBEC
20.0000 mg | DELAYED_RELEASE_TABLET | Freq: Every day | ORAL | Status: DC
  Administered 2020-10-04 – 2020-10-08 (×5): 20 mg via ORAL
  Filled 2020-10-04 (×5): qty 1

## 2020-10-04 MED ORDER — ACETAMINOPHEN 650 MG RE SUPP: 650.0000 mg | Freq: Four times a day (QID) | RECTAL | Status: DC | PRN

## 2020-10-04 MED ORDER — HYDROCODONE-ACETAMINOPHEN 5-325 MG PO TABS
1.0000 | ORAL_TABLET | Freq: Four times a day (QID) | ORAL | Status: DC | PRN
Start: 2020-10-04 — End: 2020-10-08
  Administered 2020-10-04 – 2020-10-08 (×9): 1 via ORAL
  Filled 2020-10-04 (×9): qty 1

## 2020-10-04 MED ORDER — LEVOTHYROXINE SODIUM 50 MCG PO TABS
50.0000 ug | ORAL_TABLET | Freq: Every day | ORAL | Status: DC
  Administered 2020-10-04: 50 ug via ORAL
  Filled 2020-10-04: qty 1

## 2020-10-04 MED ORDER — PREDNISONE 20 MG PO TABS
20.0000 mg | ORAL_TABLET | Freq: Every day | ORAL | Status: DC
  Administered 2020-10-04 – 2020-10-08 (×5): 20 mg via ORAL
  Filled 2020-10-04 (×5): qty 1

## 2020-10-04 MED ORDER — ENOXAPARIN SODIUM 40 MG/0.4ML IJ SOSY
40.0000 mg | PREFILLED_SYRINGE | INTRAMUSCULAR | Status: DC
  Administered 2020-10-04: 40 mg via SUBCUTANEOUS
  Filled 2020-10-04: qty 0.4

## 2020-10-04 MED ORDER — SACCHAROMYCES BOULARDII 250 MG PO CAPS
250.0000 mg | ORAL_CAPSULE | Freq: Two times a day (BID) | ORAL | Status: DC
  Administered 2020-10-04 – 2020-10-08 (×8): 250 mg via ORAL
  Filled 2020-10-04 (×10): qty 1

## 2020-10-04 MED ORDER — SODIUM CHLORIDE 0.9 % IV BOLUS
500.0000 mL | Freq: Once | INTRAVENOUS | Status: AC
  Administered 2020-10-04: 500 mL via INTRAVENOUS

## 2020-10-04 MED ORDER — VENLAFAXINE HCL ER 150 MG PO CP24
150.0000 mg | ORAL_CAPSULE | Freq: Every day | ORAL | Status: DC
  Administered 2020-10-04 – 2020-10-08 (×5): 150 mg via ORAL
  Filled 2020-10-04: qty 1
  Filled 2020-10-04: qty 2
  Filled 2020-10-04 (×3): qty 1

## 2020-10-04 MED ORDER — EZETIMIBE 10 MG PO TABS
10.0000 mg | ORAL_TABLET | Freq: Every day | ORAL | Status: DC
  Administered 2020-10-04 – 2020-10-08 (×5): 10 mg via ORAL
  Filled 2020-10-04 (×5): qty 1

## 2020-10-04 MED ORDER — BUSPIRONE HCL 5 MG PO TABS
15.0000 mg | ORAL_TABLET | Freq: Two times a day (BID) | ORAL | Status: DC
  Administered 2020-10-04 – 2020-10-08 (×9): 15 mg via ORAL
  Filled 2020-10-04 (×5): qty 3
  Filled 2020-10-04: qty 2
  Filled 2020-10-04 (×3): qty 3

## 2020-10-04 NOTE — ED Provider Notes (Signed)
Patient received as signout at shift change.  Patient being seen for fever with generalized arthralgias and myalgias.  Patient with history of CLL, has had documented progressive rise of her white blood cell count.  She is not currently receiving any treatment.  Patient with history of recurrent urinary tract infections.  She has chronic urinary incontinence.  She has previously required urethral stents, but stents were removed and patient had balloon dilation of a ureteral stricture by Dr. Gloriann Loan.  Patient reports that she has not had any infections since that procedure which occurred in May of this year.  Urinalysis does suggest recurrent infection.  CT scan with chronic left hydronephrosis, no hydroureter.  White blood cell count is 62.2.  C-reactive protein and sed rate are elevated.  Lactic acid normal at 1.2.  Patient with elevated BUN and creatinine consistent with mild dehydration.  She has received IV fluids.  No hypotension.  Patient's potassium is slightly elevated at 5.4.  She is not currently receiving any chemotherapy for her CLL.  Uric acid level is normal.  Calcium level is normal.  Doubt tumor lysis syndrome at this time.   Orpah Greek, MD 10/04/20 [phone]

## 2020-10-04 NOTE — Consult Note (Signed)
Urology Consult Note   Requesting Attending Physician:  Dessa Phi, DO Service Providing Consult: Urology  Consulting Attending: Raynelle Bring, MD   Reason for Consult: UTI, hydronephrosis  HPI: Sarah [name] is seen in consultation for reasons noted above at the request of Dessa Phi, DO for evaluation of UTI, hydronephrosis.  This is a 80 y.o. 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  She is a patient of Dr. Arlean Hopping and she underwent diagnostic ureteroscopy and balloon dilation of the proximal left ureter in May 2022.  Since then has been doing okay.  She has chronic left hydronephrosis which she has known about since she was young.  Last functional scan revealed a left renal function split without 23%.  Appears to have a crossing vessel at the UPJ on CT scan.  She presents with increasing joint pain as well as some lower urinary tract symptoms including dysuria.  Denies fevers at home.  Found to have an increase in her white count from her baseline with leukemia.  However blood cell count was 62 on presentation but has down trended to 46.4 with antibiotics and fluids.  Creatinine is 1.1 around her baseline.  She is afebrile hemodynamically stable.  Urinalysis revealed large leukocytes, negative nitrites, rare bacteria.   Past Medical History: Past Medical History:  Diagnosis Date   Acute cystitis with hematuria 12/15/2019   AKI (acute kidney injury) ([place]) 05/06/2020   Allergic rhinitis due to pollen 12/08/2019   Allergy    Anxiety    Arthritis    CLL (chronic lymphocytic leukemia) (HCC)    Coronary artery disease 06/09/2020   Dyspnea    continuos   Elevated cholesterol 12/08/2019   Elevated TSH 06/10/2020   Fatigue 06/10/2020   GERD (gastroesophageal reflux disease)    Gout    History of gout 12/08/2019   History of heart attack    History of MI (myocardial infarction) 12/08/2019   History of myocardial  infarction 12/08/2019   HLD (hyperlipidemia)    Hospital discharge follow-up 05/25/2020   Hypertension    Hypokalemia 05/25/2020   Hypotension 05/06/2020   Macrocytic anemia 12/15/2019   Non Hodgkin's lymphoma ([place])    Other general symptoms and signs  06/10/2020   Urinary frequency 12/08/2019   UTI (urinary tract infection) 05/06/2020    Past Surgical History:  Past Surgical History:  Procedure Laterality Date   ABDOMINAL HYSTERECTOMY     APPENDECTOMY     CHOLECYSTECTOMY     CYSTOSCOPY WITH STENT PLACEMENT Left 05/12/2020   Procedure: CYSTOSCOPY WITH STENT PLACEMENT;  Surgeon: Lucas Mallow, MD;  Location: [facility];  Service: Urology;  Laterality: Left;   CYSTOSCOPY/URETEROSCOPY/HOLMIUM LASER/STENT PLACEMENT Left 06/23/2020   Procedure: CYSTOSCOPY LEFT diagnostic /URETEROSCOPy /STENT  EXCHANGE ureteral BALLOON DILATION;  Surgeon: Lucas Mallow, MD;  Location: [facility];  Service: Urology;  Laterality: Left;  REQUESTING 1 HR   Double masectomy     KIDNEY SURGERY     MOHS SURGERY     TONSILECTOMY, ADENOIDECTOMY, BILATERAL MYRINGOTOMY AND TUBES      Medication: Current Facility-Administered Medications  Medication Dose Route Frequency Provider Last Rate Last Admin   acetaminophen (TYLENOL) tablet 650 mg  650 mg Oral Q6H PRN Rise Patience, MD       Or   acetaminophen (TYLENOL) suppository 650 mg  650 mg Rectal Q6H PRN Rise Patience, MD       busPIRone (BUSPAR) tablet  15 mg  15 mg Oral BID Rise Patience, MD       cefTRIAXone (ROCEPHIN) 1 g in sodium chloride 0.9 % 100 mL IVPB  1 g Intravenous Q24H Rise Patience, MD       enoxaparin (LOVENOX) injection 40 mg  40 mg Subcutaneous Q24H Rise Patience, MD       ezetimibe (ZETIA) tablet 10 mg  10 mg Oral Daily Rise Patience, MD       HYDROcodone-acetaminophen (NORCO/VICODIN) 5-325 MG per tablet 1 tablet  1 tablet Oral Q6H PRN Rise Patience, MD       levothyroxine (SYNTHROID) tablet 50 mcg  50  mcg Oral Daily Rise Patience, MD   50 mcg at 10/04/20 0806   pantoprazole (PROTONIX) EC tablet 20 mg  20 mg Oral Daily Rise Patience, MD       saccharomyces boulardii (FLORASTOR) capsule 250 mg  250 mg Oral BID Rise Patience, MD       venlafaxine XR (EFFEXOR-XR) 24 hr capsule 150 mg  150 mg Oral Daily Rise Patience, MD       Current Outpatient Medications  Medication Sig Dispense Refill   busPIRone (BUSPAR) 15 MG tablet Take 1 tablet (15 mg total) by mouth 2 (two) times daily. 180 tablet 1   ezetimibe (ZETIA) 10 MG tablet Take 1 tablet (10 mg total) by mouth daily. 90 tablet 3   fluticasone (FLONASE) 50 MCG/ACT nasal spray Place 2 sprays into both nostrils daily. (Patient taking differently: Place 2 sprays into both nostrils as needed for allergies.) 16 g 6   guaifenesin (HUMIBID E) 400 MG TABS tablet Take 400 mg by mouth every 6 (six) hours as needed (allergies/ mucus).     HYDROcodone-acetaminophen (NORCO) 5-325 MG tablet Take 1 tablet by mouth every 6 (six) hours as needed for moderate pain. 20 tablet 0   ipratropium (ATROVENT) 0.03 % nasal spray Place 2 sprays into both nostrils 2 (two) times daily. 30 mL 0   lansoprazole (PREVACID) 30 MG capsule TAKE 1 CAPSULE BY MOUTH EVERY DAY (Patient taking differently: Take 30 mg by mouth daily at 12 noon.) 30 capsule 0   levothyroxine (SYNTHROID) 50 MCG tablet TAKE 1 TABLET (50 MCG TOTAL) BY MOUTH DAILY. 30 MINUTES PRIOR TO EATING. 90 tablet 0   loratadine (CLARITIN) 10 MG tablet Take 10 mg by mouth daily as needed for allergies.     Multiple Vitamins-Minerals (EQ MULTIVITAMINS ADULT GUMMY) CHEW Chew 1 tablet by mouth in the morning. Unknown strength     Polyethyl Glycol-Propyl Glycol (SYSTANE) 0.4-0.3 % SOLN Apply 1 drop to eye as needed. (Patient taking differently: Apply 1 drop to eye as needed (dryness).) 10 mL 0   potassium chloride (MICRO-K) 10 MEQ CR capsule TAKE 1 CAPSULE BY MOUTH EVERY DAY (Patient taking differently:  Take 10 mEq by mouth daily.) 90 capsule 0   saccharomyces boulardii (FLORASTOR) 250 MG capsule Take 30 mg by mouth daily.     trimethoprim (TRIMPEX) 100 MG tablet Take 100 mg by mouth daily.     venlafaxine XR (EFFEXOR-XR) 150 MG 24 hr capsule TAKE 1 CAPSULE BY MOUTH EVERY DAY FOR MOOD (Patient taking differently: Take 150 mg by mouth daily with breakfast.) 90 capsule 3    Allergies: Allergies  Allergen Reactions   Amoxicillin     GI upset    Augmentin [Amoxicillin-Pot Clavulanate]     GI upset    Drug [Tape]  Irritation    Sulfa Antibiotics     Childhood    Atenolol Rash   Macrodantin [Nitrofurantoin] Rash    Social History: Social History   Tobacco Use   Smoking status: Never   Smokeless tobacco: Never  Vaping Use   Vaping Use: Never used  Substance Use Topics   Alcohol use: Not Currently    Comment: Rare social drinking   Drug use: Never    Family History Family History  Problem Relation Age of Onset   Colon cancer Mother    Heart attack Father     Review of Systems 10 systems were reviewed and are negative except as noted specifically in the HPI.  Objective   Vital signs in last 24 hours: BP (!) 141/63   Pulse 80   Temp 98.5 F (36.9 C) (Oral)   Resp (!) 23   Ht '5\' 3"'$  (1.6 m)   Wt 72.1 kg   SpO2 95%   BMI 28.16 kg/m   Physical Exam General: NAD, A&O, resting, appropriate HEENT: [place]/AT, EOMI, MMM Pulmonary: Normal work of breathing Cardiovascular: HDS, adequate peripheral perfusion Abdomen: Soft, NTTP, nondistended, no CVA tenderness GU: Pure wick in place Extremities: warm and well perfused Neuro: Appropriate, no focal neurological deficits  Most Recent Labs: Lab Results  Component Value Date   WBC 46.4 (H) 10/04/2020   HGB 9.2 (L) 10/04/2020   HCT 30.1 (L) 10/04/2020   PLT 191 10/04/2020    Lab Results  Component Value Date   NA 139 10/04/2020   K 4.6 10/04/2020   CL 108 10/04/2020   CO2 24 10/04/2020   BUN 30 (H) 10/04/2020    CREATININE 1.10 (H) 10/04/2020   CALCIUM 8.5 (L) 10/04/2020   MG 1.8 05/20/2020   PHOS 3.3 05/20/2020    Lab Results  Component Value Date   INR 1.0 05/17/2020   APTT 34 05/17/2020     Urine Culture: '@[id number]'$ (laburin,org,[id number],[id number])@   IMAGING: DG Chest Portable 1 View  Result Date: 10/03/2020 CLINICAL DATA:  Fevers EXAM: PORTABLE CHEST 1 VIEW COMPARISON:  07/15/2020 FINDINGS: Cardiac shadow is within normal limits. The lungs are clear bilaterally. Old rib fractures are noted on the left. No acute bony abnormality is seen. IMPRESSION: No acute abnormality noted. Electronically Signed   By: Inez Catalina M.D.   On: 10/03/2020 20:47   CT RENAL STONE STUDY  Result Date: 10/04/2020 CLINICAL DATA:  Fever, fatigue and flank pain EXAM: CT ABDOMEN AND PELVIS WITHOUT CONTRAST TECHNIQUE: Multidetector CT imaging of the abdomen and pelvis was performed following the standard protocol without IV contrast. COMPARISON:  05/08/2020 FINDINGS: LOWER CHEST: Normal. HEPATOBILIARY: Normal hepatic contours. No intra- or extrahepatic biliary dilatation. Status post cholecystectomy. PANCREAS: Normal pancreas. No ductal dilatation or peripancreatic fluid collection. SPLEEN: Spleen is enlarged, measuring 13 cm in AP dimension. ADRENALS/URINARY TRACT: The adrenal glands are normal. 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 No right ureteral calculus or hydronephrosis. Unchanged left moderate hydronephrosis without hydroureter. No ureteral calculus. Urinary bladder is normal. STOMACH/BOWEL: There is no hiatal hernia. Normal duodenal course and caliber. No small bowel dilatation or inflammation. No focal colonic abnormality. The appendix is not visualized. No right lower quadrant inflammation or free fluid. VASCULAR/LYMPHATIC: There is calcific atherosclerosis of the abdominal aorta. Unchanged left para-aortic lymphadenopathy. Numerous small mesenteric lymph nodes also appear unchanged. REPRODUCTIVE: Status  post hysterectomy. No adnexal mass. MUSCULOSKELETAL. No bony spinal canal stenosis or focal osseous abnormality. OTHER: None. IMPRESSION: 1. No acute abnormality of  the abdomen or pelvis. 2. Unchanged left moderate hydronephrosis without hydroureter. 3. 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 4. Unchanged appearance of retroperitoneal and mesenteric lymphadenopathy. 5. Mild splenomegaly. Aortic Atherosclerosis (ICD10-I70.0). Electronically Signed   By: Ulyses Jarred M.D.   On: 10/04/2020 02:12    ------  Assessment:  80 y.o. 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  She presents with increasing joint pain, lower urinary tract symptoms.  She is afebrile hemodynamically stable here.  Has been started on ceftriaxone and received fluids.  White blood cell count is downtrending.  Her creatinine is at her baseline.  Given her hydronephrosis is chronic, and the patient is stable, there is no urgent indication for urologic intervention.  We do agree with treatment with brought in a biotics tailored per her urine culture data.  Recommend a treatment course for complicated UTI.  We will monitor for stabilization with conservative medical therapy.  At this time urgent decompression of the upper urinary tract does not indicated.  Recommendations: - At this time urgent decompression of the upper urinary tract does not indicated as creatinine is stable, degree of hydronephrosis is stable, patient is hemodynamically stable -Agree with broad antibiotics tailored per her culture data for complicated UTI course   Thank you for this consult. Please contact the urology consult pager with any further questions/concerns.

## 2020-10-04 NOTE — ED Notes (Signed)
Back from CT

## 2020-10-04 NOTE — H&P (Signed)
History and Physical    Sarah [name] [id number] DOB: 1940/10/03 DOA: 10/03/2020  PCP: Libby Maw, MD  Patient coming from: Home.  Chief Complaint: Joint pains.  Dysuria.  HPI: Sarah [name] is a 80 y.o. female with history of CLL, breast cancer in remission, CAD, hypothyroidism, hyperlipidemia presents to the ER because of joint pains involving symmetrically the knee ankle wrist and hand.  Patient states about 3 weeks ago she had some eye congestion which was treated with drops and patient also was given oral antibiotic following which patient developed diarrhea and joint pain.  Her antibiotic was discontinued.  Joint pain persisted.  Patient finds it difficult to walk.  Over the last few days patient also had some dysuria subjective feeling of fever chills.  Patient denies any insect bites or sick contacts.  No obvious rash.  ED Course: In the ER patient's UA concerning for UTI.  Patient's sed rate is 42 and CRP is 7.9 lactic acid normal WBC count has increased from baseline around 50s it is now 62.  Uric acid is 5.7.  CT abdomen shows left Hydro nephrosis with no hydroureter.  Patient was started on antibiotics admitted for further observation.  Potassium was 5.4.  Patient does take potassium supplements.  Review of Systems: As per HPI, rest all negative.   Past Medical History:  Diagnosis Date   Acute cystitis with hematuria 12/15/2019   AKI (acute kidney injury) ([place]) 05/06/2020   Allergic rhinitis due to pollen 12/08/2019   Allergy    Anxiety    Arthritis    CLL (chronic lymphocytic leukemia) (HCC)    Coronary artery disease 06/09/2020   Dyspnea    continuos   Elevated cholesterol 12/08/2019   Elevated TSH 06/10/2020   Fatigue 06/10/2020   GERD (gastroesophageal reflux disease)    Gout    History of gout 12/08/2019   History of heart attack    History of MI (myocardial infarction) 12/08/2019   History of myocardial infarction 12/08/2019   HLD  (hyperlipidemia)    Hospital discharge follow-up 05/25/2020   Hypertension    Hypokalemia 05/25/2020   Hypotension 05/06/2020   Macrocytic anemia 12/15/2019   Non Hodgkin's lymphoma ([place])    Other general symptoms and signs  06/10/2020   Urinary frequency 12/08/2019   UTI (urinary tract infection) 05/06/2020    Past Surgical History:  Procedure Laterality Date   ABDOMINAL HYSTERECTOMY     APPENDECTOMY     CHOLECYSTECTOMY     CYSTOSCOPY WITH STENT PLACEMENT Left 05/12/2020   Procedure: CYSTOSCOPY WITH STENT PLACEMENT;  Surgeon: Lucas Mallow, MD;  Location: [facility];  Service: Urology;  Laterality: Left;   CYSTOSCOPY/URETEROSCOPY/HOLMIUM LASER/STENT PLACEMENT Left 06/23/2020   Procedure: CYSTOSCOPY LEFT diagnostic /URETEROSCOPy /STENT  EXCHANGE ureteral BALLOON DILATION;  Surgeon: Lucas Mallow, MD;  Location: [facility];  Service: Urology;  Laterality: Left;  REQUESTING 1 HR   Double masectomy     KIDNEY SURGERY     MOHS SURGERY     TONSILECTOMY, ADENOIDECTOMY, BILATERAL MYRINGOTOMY AND TUBES       reports that she has never smoked. She has never used smokeless tobacco. She reports that she does not currently use alcohol. She reports that she does not use drugs.  Allergies  Allergen Reactions   Amoxicillin     GI upset    Augmentin [Amoxicillin-Pot Clavulanate]     GI upset    Drug [Tape]     Irritation    Sulfa  Antibiotics     Childhood    Atenolol Rash   Macrodantin [Nitrofurantoin] Rash    Family History  Problem Relation Age of Onset   Colon cancer Mother    Heart attack Father     Prior to Admission medications   Medication Sig Start Date End Date Taking? Authorizing Provider  busPIRone (BUSPAR) 15 MG tablet Take 1 tablet (15 mg total) by mouth 2 (two) times daily. 07/15/20  Yes Libby Maw, MD  ezetimibe (ZETIA) 10 MG tablet Take 1 tablet (10 mg total) by mouth daily. 07/02/20  Yes Richardo Priest, MD  fluticasone (FLONASE) 50 MCG/ACT nasal spray Place 2  sprays into both nostrils daily. Patient taking differently: Place 2 sprays into both nostrils as needed for allergies. 12/08/19  Yes Libby Maw, MD  guaifenesin (HUMIBID E) 400 MG TABS tablet Take 400 mg by mouth every 6 (six) hours as needed (allergies/ mucus).   Yes [provider]  HYDROcodone-acetaminophen (NORCO) 5-325 MG tablet Take 1 tablet by mouth every 6 (six) hours as needed for moderate pain. 10/01/20  Yes Libby Maw, MD  ipratropium (ATROVENT) 0.03 % nasal spray Place 2 sprays into both nostrils 2 (two) times daily. 09/15/20  Yes Nche, Charlene Brooke, NP  lansoprazole (PREVACID) 30 MG capsule TAKE 1 CAPSULE BY MOUTH EVERY DAY Patient taking differently: Take 30 mg by mouth daily at 12 noon. 09/28/20  Yes Libby Maw, MD  levothyroxine (SYNTHROID) 50 MCG tablet TAKE 1 TABLET (50 MCG TOTAL) BY MOUTH DAILY. [place]. 09/13/20  Yes Libby Maw, MD  loratadine (CLARITIN) 10 MG tablet Take 10 mg by mouth daily as needed for allergies.   Yes [provider]  Multiple Vitamins-Minerals (EQ MULTIVITAMINS ADULT GUMMY) CHEW Chew 1 tablet by mouth in the morning. Unknown strength   Yes [provider]  Polyethyl Glycol-Propyl Glycol (SYSTANE) 0.4-0.3 % SOLN Apply 1 drop to eye as needed. Patient taking differently: Apply 1 drop to eye as needed (dryness). 09/15/20  Yes Nche, Charlene Brooke, NP  potassium chloride (MICRO-K) 10 MEQ CR capsule TAKE 1 CAPSULE BY MOUTH EVERY DAY Patient taking differently: Take 10 mEq by mouth daily. 08/02/20  Yes Libby Maw, MD  saccharomyces boulardii (FLORASTOR) 250 MG capsule Take 30 mg by mouth daily.   Yes [provider]  trimethoprim (TRIMPEX) 100 MG tablet Take 100 mg by mouth daily. 07/09/20  Yes [provider]  venlafaxine XR (EFFEXOR-XR) 150 MG 24 hr capsule TAKE 1 CAPSULE BY MOUTH EVERY DAY FOR MOOD Patient taking differently: Take 150 mg by  mouth daily with breakfast. 07/29/20  Yes Libby Maw, MD  metoprolol tartrate (LOPRESSOR) 25 MG tablet Take 25 mg by mouth 2 (two) times daily.  01/05/20  [provider]    Physical Exam: Constitutional: Moderately built and nourished. Vitals:   10/04/20 0430 10/04/20 0500 10/04/20 0630 10/04/20 0635  BP: 126/70 138/64 123/74   Pulse: 73 75 76   Resp: 20 (!) 22 (!) 25   Temp:    98.5 F (36.9 C)  TempSrc:    Oral  SpO2: 100% 100% 93%   Weight:      Height:       Eyes: Anicteric no pallor. ENMT: No discharge from the ears eyes nose and mouth. Neck: No mass felt.  No neck rigidity. Respiratory: No rhonchi or crepitations. Cardiovascular: S1-S2 heard. Abdomen: Soft nontender bowel sound present. Musculoskeletal: No obvious joint swellings. Skin:  No rash. Neurologic: Alert awake oriented to time place and person.  Moves all extremities. Psychiatric: Appears normal.  Normal affect.   Labs on Admission: I have personally reviewed following labs and imaging studies  CBC: Recent Labs  Lab 10/03/20 1912  WBC 62.2*  NEUTROABS 1.8  HGB 10.7*  HCT 34.7*  MCV 102.1*  PLT 99991111   Basic Metabolic Panel: Recent Labs  Lab 09/29/20 1415 10/03/20 1912  NA 141 136  K 4.4 5.4*  CL 107* 104  CO2 17* 22  GLUCOSE 137* 118*  BUN 24 33*  CREATININE 1.28* 1.17*  CALCIUM 8.8 9.1   GFR: Estimated Creatinine Clearance: 36.5 mL/min (A) (by C-G formula based on SCr of 1.17 mg/dL (H)). Liver Function Tests: Recent Labs  Lab 09/29/20 1415 10/03/20 1912  AST 14 32  ALT 9 19  ALKPHOS 138* 130*  BILITOT 0.3 0.4  PROT 5.1* 6.2*  ALBUMIN 3.7 3.4*   No results for input(s): LIPASE, AMYLASE in the last 168 hours. No results for input(s): AMMONIA in the last 168 hours. Coagulation Profile: No results for input(s): INR, PROTIME in the last 168 hours. Cardiac Enzymes: No results for input(s): CKTOTAL, CKMB, CKMBINDEX, TROPONINI in the last 168 hours. BNP (last 3  results) Recent Labs    07/15/20 1057  PROBNP 558   HbA1C: No results for input(s): HGBA1C in the last 72 hours. CBG: No results for input(s): GLUCAP in the last 168 hours. Lipid Profile: No results for input(s): CHOL, HDL, LDLCALC, TRIG, CHOLHDL, LDLDIRECT in the last 72 hours. Thyroid Function Tests: No results for input(s): TSH, T4TOTAL, FREET4, T3FREE, THYROIDAB in the last 72 hours. Anemia Panel: No results for input(s): VITAMINB12, FOLATE, FERRITIN, TIBC, IRON, RETICCTPCT in the last 72 hours. Urine analysis:    Component Value Date/Time   COLORURINE YELLOW 10/03/2020 2232   APPEARANCEUR HAZY (A) 10/03/2020 2232   LABSPEC 1.014 10/03/2020 2232   PHURINE 5.0 10/03/2020 2232   GLUCOSEU NEGATIVE 10/03/2020 2232   GLUCOSEU NEGATIVE 08/11/2020 1141   HGBUR NEGATIVE 10/03/2020 2232   BILIRUBINUR NEGATIVE 10/03/2020 2232   BILIRUBINUR neg 06/10/2020 [place] 10/03/2020 2232   PROTEINUR 30 (A) 10/03/2020 2232   UROBILINOGEN 0.2 08/11/2020 1141   NITRITE NEGATIVE 10/03/2020 2232   LEUKOCYTESUR LARGE (A) 10/03/2020 2232   Sepsis Labs: '@LABRCNTIP'$ (procalcitonin:4,lacticidven:4) ) Recent Results (from the past 240 hour(s))  Blood culture (routine x 2)     Status: None (Preliminary result)   Collection Time: 10/03/20  8:20 PM   Specimen: BLOOD RIGHT ARM  Result Value Ref Range Status   Specimen Description   Final    BLOOD RIGHT ARM Performed at [facility], [place] [address], [place], [place] [address]    Special Requests   Final    BOTTLES DRAWN AEROBIC AND ANAEROBIC Blood Culture adequate volume Performed at [facility] [address], [place], [place] [address]    Culture PENDING  Incomplete   Report Status PENDING  Incomplete     Radiological Exams on Admission: DG Chest Portable 1 View  Result Date: 10/03/2020 CLINICAL DATA:  Fevers EXAM: PORTABLE CHEST 1 VIEW COMPARISON:  07/15/2020 FINDINGS: Cardiac shadow is within  normal limits. The lungs are clear bilaterally. Old rib fractures are noted on the left. No acute bony abnormality is seen. IMPRESSION: No acute abnormality noted. Electronically Signed   By: Inez Catalina M.D.   On: 10/03/2020 20:47   CT RENAL STONE STUDY  Result Date:  10/04/2020 CLINICAL DATA:  Fever, fatigue and flank pain EXAM: CT ABDOMEN AND PELVIS WITHOUT CONTRAST TECHNIQUE: Multidetector CT imaging of the abdomen and pelvis was performed following the standard protocol without IV contrast. COMPARISON:  05/08/2020 FINDINGS: LOWER CHEST: Normal. HEPATOBILIARY: Normal hepatic contours. No intra- or extrahepatic biliary dilatation. Status post cholecystectomy. PANCREAS: Normal pancreas. No ductal dilatation or peripancreatic fluid collection. SPLEEN: Spleen is enlarged, measuring 13 cm in AP dimension. ADRENALS/URINARY TRACT: The adrenal glands are normal. 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 No right ureteral calculus or hydronephrosis. Unchanged left moderate hydronephrosis without hydroureter. No ureteral calculus. Urinary bladder is normal. STOMACH/BOWEL: There is no hiatal hernia. Normal duodenal course and caliber. No small bowel dilatation or inflammation. No focal colonic abnormality. The appendix is not visualized. No right lower quadrant inflammation or free fluid. VASCULAR/LYMPHATIC: There is calcific atherosclerosis of the abdominal aorta. Unchanged left para-aortic lymphadenopathy. Numerous small mesenteric lymph nodes also appear unchanged. REPRODUCTIVE: Status post hysterectomy. No adnexal mass. MUSCULOSKELETAL. No bony spinal canal stenosis or focal osseous abnormality. OTHER: None. IMPRESSION: 1. No acute abnormality of the abdomen or pelvis. 2. Unchanged left moderate hydronephrosis without hydroureter. 3. 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 4. Unchanged appearance of retroperitoneal and mesenteric lymphadenopathy. 5. Mild splenomegaly. Aortic Atherosclerosis  (ICD10-I70.0). Electronically Signed   By: Ulyses Jarred M.D.   On: 10/04/2020 02:12      Assessment/Plan Active Problems:   HLD (hyperlipidemia)   Coronary artery disease   Hypertension   Arthralgia   Complicated UTI (urinary tract infection)    Generalized weakness with arthralgia and myalgia cause not clear.  Cultures have been sent.  Patient was empirically placed on antibiotics for UTI.  Since patient has symmetrical joint pain and elevated CRP levels will check ANA and rheumatoid factor.  Be unusual for presenting at this age. Complicated UTI with history of left hydronephrosis.  Placed on antibiotics for urine cultures did discuss with on-call urologist will be seeing patient in consult. History of CLL with worsening WBC count.  Will list Dr. Lorenso Courier patient's oncologist for consult. Hypothyroidism on Synthroid. History of CAD denies any chest pain. Anemia follow CBC.  COVID test is pending.   DVT prophylaxis: Lovenox. Code Status: Full code. Family Communication: Discussed with patient. Disposition Plan: Home. Consults called: Physical therapy.  Urology.  Oncology. Admission status: Observation.   Rise Patience MD [facility] Pager [phone].  If 7PM-7AM, please contact night-coverage [email or web address] Password [facility]  10/04/2020, 6:47 AM

## 2020-10-04 NOTE — ED Notes (Signed)
Lab called to add on additional labs.

## 2020-10-04 NOTE — Progress Notes (Signed)
  PROGRESS NOTE  Patient admitted earlier this morning. See H&P.   Sarah [name] is a 80 y.o. female with history of CLL, breast cancer in remission, CAD, hypothyroidism, hyperlipidemia presents to the ER because of joint pains involving symmetrically the knee ankle wrist and hand.  She states that about 3 weeks ago, she had bilateral eye redness, irritation and saw an ophthalmologist.  She was diagnosed with viral syndrome, given oral doxycycline to ward off bacterial superinfection.  This caused her to have diarrhea.  Antibiotic was decreased.  She then developed acute onset of diffuse joint pain.  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  This presented in bilateral feet, ankles, knees, wrists as well as knuckles specifically right MTP and left DIP, as well as her neck.  She has never had this sort of joint pain in the past.  Also admits to dysuria.  She is not sexually active.  Diarrhea has now resolved.  On physical exam, joints are without effusion or erythema.  Suspicion for septic arthritis is very low.  Uric acid is normal.  Her age does not correlate with onset of rheumatoid arthritis.  Concern for reactive arthritis in setting of recent illness, with extra-articular signs such as conjunctivitis/uveitis, diarrhea, GU infection.  CRP, sed rate remain elevated  Avoiding NSAIDs due to renal function Trial prednisone 20 mg daily ANA, rheumatoid factor, anti-CCP antibody, HLA B27 Pending Rocephin for UTI Urology consulted due to left hydronephrosis Need PT evaluation as patient says she is unable to walk.  At baseline, she is currently living with her daughter after moving to [place] from [place].  Status is: Observation  The patient will require care spanning > 2 midnights and should be moved to inpatient because: Unsafe d/c plan and Inpatient level of care appropriate due to severity of illness  Dispo: The patient is from: Home               Anticipated d/c is to: SNF              Patient currently is not medically stable to d/c.   Difficult to place patient No       Dessa Phi, DO [facility] 10/04/2020, 10:54 AM  Available via Epic secure chat 7am-7pm After these hours, please refer to coverage provider listed on [email or web address]

## 2020-10-05 ENCOUNTER — Encounter (HOSPITAL_COMMUNITY): Payer: Self-pay | Admitting: Internal Medicine

## 2020-10-05 DIAGNOSIS — R197 Diarrhea, unspecified: Secondary | ICD-10-CM | POA: Diagnosis present

## 2020-10-05 DIAGNOSIS — I10 Essential (primary) hypertension: Secondary | ICD-10-CM | POA: Diagnosis present

## 2020-10-05 DIAGNOSIS — Z883 Allergy status to other anti-infective agents status: Secondary | ICD-10-CM | POA: Diagnosis not present

## 2020-10-05 DIAGNOSIS — Z888 Allergy status to other drugs, medicaments and biological substances status: Secondary | ICD-10-CM | POA: Diagnosis not present

## 2020-10-05 DIAGNOSIS — E039 Hypothyroidism, unspecified: Secondary | ICD-10-CM | POA: Diagnosis present

## 2020-10-05 DIAGNOSIS — D649 Anemia, unspecified: Secondary | ICD-10-CM | POA: Diagnosis present

## 2020-10-05 DIAGNOSIS — Z853 Personal history of malignant neoplasm of breast: Secondary | ICD-10-CM | POA: Diagnosis not present

## 2020-10-05 DIAGNOSIS — I2510 Atherosclerotic heart disease of native coronary artery without angina pectoris: Secondary | ICD-10-CM | POA: Diagnosis present

## 2020-10-05 DIAGNOSIS — I252 Old myocardial infarction: Secondary | ICD-10-CM | POA: Diagnosis not present

## 2020-10-05 DIAGNOSIS — E7800 Pure hypercholesterolemia, unspecified: Secondary | ICD-10-CM | POA: Diagnosis present

## 2020-10-05 DIAGNOSIS — Z79899 Other long term (current) drug therapy: Secondary | ICD-10-CM | POA: Diagnosis not present

## 2020-10-05 DIAGNOSIS — Z66 Do not resuscitate: Secondary | ICD-10-CM | POA: Diagnosis present

## 2020-10-05 DIAGNOSIS — M109 Gout, unspecified: Secondary | ICD-10-CM | POA: Diagnosis present

## 2020-10-05 DIAGNOSIS — K219 Gastro-esophageal reflux disease without esophagitis: Secondary | ICD-10-CM | POA: Diagnosis present

## 2020-10-05 DIAGNOSIS — M0239 Reiter's disease, multiple sites: Secondary | ICD-10-CM | POA: Diagnosis present

## 2020-10-05 DIAGNOSIS — Z91048 Other nonmedicinal substance allergy status: Secondary | ICD-10-CM | POA: Diagnosis not present

## 2020-10-05 DIAGNOSIS — Z880 Allergy status to penicillin: Secondary | ICD-10-CM | POA: Diagnosis not present

## 2020-10-05 DIAGNOSIS — C9590 Leukemia, unspecified not having achieved remission: Secondary | ICD-10-CM | POA: Diagnosis present

## 2020-10-05 DIAGNOSIS — Z20822 Contact with and (suspected) exposure to covid-19: Secondary | ICD-10-CM | POA: Diagnosis present

## 2020-10-05 DIAGNOSIS — Z79890 Hormone replacement therapy (postmenopausal): Secondary | ICD-10-CM | POA: Diagnosis not present

## 2020-10-05 DIAGNOSIS — Z8249 Family history of ischemic heart disease and other diseases of the circulatory system: Secondary | ICD-10-CM | POA: Diagnosis not present

## 2020-10-05 DIAGNOSIS — Z882 Allergy status to sulfonamides status: Secondary | ICD-10-CM | POA: Diagnosis not present

## 2020-10-05 DIAGNOSIS — F32A Depression, unspecified: Secondary | ICD-10-CM | POA: Diagnosis present

## 2020-10-05 DIAGNOSIS — N136 Pyonephrosis: Secondary | ICD-10-CM | POA: Diagnosis present

## 2020-10-05 DIAGNOSIS — C9110 Chronic lymphocytic leukemia of B-cell type not having achieved remission: Secondary | ICD-10-CM | POA: Diagnosis present

## 2020-10-05 LAB — BASIC METABOLIC PANEL
Anion gap: 8 (ref 5–15)
BUN: 28 mg/dL — ABNORMAL HIGH (ref 8–23)
CO2: 24 mmol/L (ref 22–32)
Calcium: 8.5 mg/dL — ABNORMAL LOW (ref 8.9–10.3)
Chloride: 107 mmol/L (ref 98–111)
Creatinine, Ser: 0.89 mg/dL (ref 0.44–1.00)
GFR, Estimated: >60 mL/min (ref >60)
Glucose, Bld: 149 mg/dL — ABNORMAL HIGH (ref 70–99)
Potassium: 4 mmol/L (ref 3.5–5.1)
Sodium: 139 mmol/L (ref 135–145)

## 2020-10-05 LAB — CBC
HCT: 27.2 % — ABNORMAL LOW (ref 36.0–46.0)
Hemoglobin: 8.2 g/dL — ABNORMAL LOW (ref 12.0–15.0)
MCH: 31.1 pg (ref 26.0–34.0)
MCHC: 30.1 g/dL (ref 30.0–36.0)
MCV: 103 fL — ABNORMAL HIGH (ref 80.0–100.0)
Platelets: 205 10*3/uL (ref 150–400)
RBC: 2.64 MIL/uL — ABNORMAL LOW (ref 3.87–5.11)
RDW: 16.4 % — ABNORMAL HIGH (ref 11.5–15.5)
WBC: 34 10*3/uL — ABNORMAL HIGH (ref 4.0–10.5)
nRBC: 0.1 % (ref 0.0–0.2)

## 2020-10-05 LAB — CYCLIC CITRUL PEPTIDE ANTIBODY, IGG/IGA: CCP Antibodies IgG/IgA: 2 units (ref 0–19)

## 2020-10-05 LAB — RHEUMATOID FACTOR: Rheumatoid fact SerPl-aCnc: 10.7 IU/mL (ref <14.0)

## 2020-10-05 LAB — ANA: Anti Nuclear Antibody (ANA): NEGATIVE

## 2020-10-05 MED ORDER — AMOXICILLIN 250 MG PO CAPS
500.0000 mg | ORAL_CAPSULE | Freq: Three times a day (TID) | ORAL | Status: DC
  Administered 2020-10-05 – 2020-10-06 (×2): 500 mg via ORAL
  Filled 2020-10-05 (×2): qty 2

## 2020-10-05 MED ORDER — DOCUSATE SODIUM 100 MG PO CAPS
100.0000 mg | ORAL_CAPSULE | Freq: Two times a day (BID) | ORAL | Status: DC | PRN
  Administered 2020-10-05 (×2): 100 mg via ORAL
  Filled 2020-10-05 (×2): qty 1

## 2020-10-05 MED ORDER — POLYETHYLENE GLYCOL 3350 17 G PO PACK
17.0000 g | PACK | Freq: Every day | ORAL | Status: DC
  Administered 2020-10-05 – 2020-10-08 (×4): 17 g via ORAL
  Filled 2020-10-05 (×4): qty 1

## 2020-10-05 NOTE — Evaluation (Addendum)
Physical Therapy Evaluation Patient Details Name: Sarah [name] MRN: [id number] DOB: 1941-01-27 Today's Date: 10/05/2020   History of Present Illness  80 y.o. female  admitted with joint pains involving symmetrically the knee ankle wrist and hand.  Patient states about 3 weeks ago she had some eye congestion which was treated with drops and patient also was given oral antibiotic following which patient developed diarrhea and joint pain, difficulty walking, dysuria.  Dx of reactive arthritis, UTI. Pt with history of CLL, breast cancer in remission, CAD, hypothyroidism, hyperlipidemia.  Clinical Impression  Pt admitted with above diagnosis. Min assist for bed mobility. Pt sat at edge of bed for ~6 minutes. She reported severe B ankle pain with weight bearing through feet so was unable to tolerate an attempt to stand. She reports she likely could tolerate more activity after having prednisone, which she's not yet had today.  Encouraged pt to attempt getting OOB (with nursing) to bedside commode and to recliner later today after getting prednisone. Pt currently with functional limitations due to the deficits listed below (see PT Problem List). Pt will benefit from skilled PT to increase their independence and safety with mobility to allow discharge to the venue listed below.       Follow Up Recommendations SNF    Equipment Recommendations  None recommended by PT    Recommendations for Other Services   OT (order placed)    Precautions / Restrictions Precautions Precautions: Fall Precaution Comments: denies falls in the past 1 year Restrictions Weight Bearing Restrictions: No      Mobility  Bed Mobility Overal bed mobility: Needs Assistance Bed Mobility: Rolling;Sit to Supine;Sidelying to Sit Rolling: Supervision Sidelying to sit: Min assist   Sit to supine: Min assist   General bed mobility comments: min A to raise trunk, then min A to bring LEs into bed; pt sat at edge of bed x ~6  minutes, no dizziness, B ankle pain with attempted weight bearing on feet so didn't attempt sit to stand    Transfers                 General transfer comment: unable 2* B ankle pain with weight bearing  Ambulation/Gait                Stairs            Wheelchair Mobility    Modified Rankin (Stroke Patients Only)       Balance   Sitting-balance support: Feet supported;No upper extremity supported Sitting balance-Leahy Scale: Good                                       Pertinent Vitals/Pain Pain Assessment: 0-10 Pain Score: 10-Worst pain ever Pain Location: hands, ankles Pain Descriptors / Indicators: Aching Pain Intervention(s): Limited activity within patient's tolerance;Monitored during session;Repositioned;Patient requesting pain meds-RN notified    Home Living Family/patient expects to be discharged to:: Private residence Living Arrangements: Children Available Help at Discharge: Available 24 hours/day Type of Home: House Home Access: Stairs to enter Entrance Stairs-Rails: None Entrance Stairs-Number of Steps: 2 Home Layout: Able to live on main level with bedroom/bathroom;Two level Home Equipment: Walker - 4 wheels;Shower seat;Bedside commode;Cane - single point      Prior Function Level of Independence: Independent with assistive device(s)         Comments: Uses rollator. Sponge bathes independently.  Daughter has fibromyalgia, daughter can  help minimally as long as she doesn't exert herself. Son-in-law able to help getting into and out of house.     Hand Dominance   Dominant Hand: Right    Extremity/Trunk Assessment   Upper Extremity Assessment Upper Extremity Assessment: Generalized weakness RUE Deficits / Details: can elevate  B shoulders actively ~90*, grip +3/5 B 2* pain RUE Coordination: decreased fine motor LUE Deficits / Details: an elevate B shoulders actively ~90*, grip +3/5 B 2* pain LUE Coordination:  decreased fine motor    Lower Extremity Assessment Lower Extremity Assessment: Generalized weakness;RLE deficits/detail;LLE deficits/detail RLE Deficits / Details: able to tolerate bed mobility, pain with WBing on feet while sitting RLE: Unable to fully assess due to pain RLE Sensation: history of peripheral neuropathy;decreased light touch LLE Deficits / Details: able to tolerate bed mobility, pain with WBing on feet while sitting LLE: Unable to fully assess due to pain LLE Sensation: history of peripheral neuropathy;decreased light touch    Cervical / Trunk Assessment Cervical / Trunk Assessment: Normal  Communication   Communication: No difficulties  Cognition Arousal/Alertness: Awake/alert Behavior During Therapy: WFL for tasks assessed/performed Overall Cognitive Status: Within Functional Limits for tasks assessed                                        General Comments      Exercises  Ankle pumps both x 10 AROM supine    Assessment/Plan    PT Assessment Patient needs continued PT services  PT Problem List Decreased strength;Decreased activity tolerance;Decreased range of motion;Decreased mobility;Decreased balance;Pain;Decreased coordination       PT Treatment Interventions DME instruction;Gait training;Functional mobility training;Therapeutic exercise;Therapeutic activities;Patient/family education    PT Goals (Current goals can be found in the Care Plan section)  Acute Rehab PT Goals Patient Stated Goal: to reduce pain, be able to walk PT Goal Formulation: With patient Time For Goal Achievement: 10/19/20 Potential to Achieve Goals: Good    Frequency Min 3X/week   Barriers to discharge        Co-evaluation               AM-PAC PT "6 Clicks" Mobility  Outcome Measure Help needed turning from your back to your side while in a flat bed without using bedrails?: A Little Help needed moving from lying on your back to sitting on the side  of a flat bed without using bedrails?: A Lot Help needed moving to and from a bed to a chair (including a wheelchair)?: Total Help needed standing up from a chair using your arms (e.g., wheelchair or bedside chair)?: Total Help needed to walk in hospital room?: Total Help needed climbing 3-5 steps with a railing? : Total 6 Click Score: 9    End of Session Equipment Utilized During Treatment: Gait belt Activity Tolerance: Patient limited by pain Patient left: in bed;with call bell/phone within reach;with bed alarm set Nurse Communication: Mobility status PT Visit Diagnosis: Difficulty in walking, not elsewhere classified (R26.2);Pain Pain - Right/Left:  (both) Pain - part of body: Ankle and joints of foot;Hand    Time: 0912-0951 PT Time Calculation (min) (ACUTE ONLY): 39 min   Charges:   PT Evaluation $PT Eval Moderate Complexity: 1 Mod PT Treatments $Therapeutic Activity: 23-37 mins       Blondell Reveal Kistler PT 10/05/2020  Acute Rehabilitation Services Pager [phone] Office [phone]

## 2020-10-05 NOTE — Progress Notes (Signed)
Pt sitting in bed, pt careplan discussed, pt c/o pain 9/10, RN gave scheduled and prn meds. Pt given water and juice. Pt room straightened up, pt denies bathroom needs. Pt denies any further needs at this time, call bell within reach and RN number on board.

## 2020-10-05 NOTE — Progress Notes (Signed)
PROGRESS NOTE    Sarah [name]  RAQ:[id number] DOB: 02-26-1940 DOA: 10/03/2020 PCP: Libby Maw, MD     Brief Narrative:  Sarah [name] is a 80 y.o. female with history of CLL, breast cancer in remission, CAD, hypothyroidism, hyperlipidemia presents to the ER because of joint pains involving symmetrically the knee ankle wrist and hand.  She states that about 3 weeks ago, she had bilateral eye redness, irritation and saw an ophthalmologist.  She was diagnosed with viral syndrome, given oral doxycycline to ward off bacterial superinfection.  This caused her to have diarrhea.  Antibiotic was decreased.  She then developed acute onset of diffuse joint pain.  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  This presented in bilateral feet, ankles, knees, wrists as well as knuckles specifically right MTP and left DIP, as well as her neck.  She has never had this sort of joint pain in the past.  Also admits to dysuria.  She is not sexually active.  Diarrhea has now resolved.  New events last 24 hours / Subjective: States that her joints were feeling better yesterday, but started to hurt again this morning.  She admits to right wrist being the most sore joint, but has soreness in both hands.  Has not been out of bed yet.  Assessment & Plan:   Principal Problem:   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[place]) Active Problems:   HLD (hyperlipidemia)   Coronary artery disease   Hypertension   Arthralgia   Complicated UTI (urinary tract infection)   Reactive arthritis -Concern for reactive arthritis in setting of recent illness, with extra-articular signs such as conjunctivitis/uveitis, diarrhea, GU infection.  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 Gloriann Loan and now s/p left ureteral stent removal.  -Urology consulted, patient does not have obstruction of left kidney at this point.  No current indication for intervention.  Recommended continued antibiotics  UTI -Urine culture showing Enterococcus, susceptibilities to follow -Rocephin --> Amoxicillin   History of CLL -Followed by Dr. Lorenso Courier  Hypothyroidism -Continue Synthroid  Depression -Continue Buspar, effexor   DVT prophylaxis:  enoxaparin (LOVENOX) injection 40 mg Start: 10/04/20 1200  Code Status:     Code Status Orders  (From admission, onward)           Start     Ordered   10/04/20 0643  Do not attempt resuscitation (DNR)  Continuous       Question Answer Comment  In the event of cardiac or respiratory ARREST Do not call a "code blue"   In the event of cardiac or respiratory ARREST Do not perform Intubation, CPR, defibrillation or ACLS   In the event of cardiac or respiratory ARREST Use medication by any route, position, wound care, and other measures to relive pain and suffering. May use oxygen, suction and manual treatment of airway obstruction as needed for comfort.      10/04/20 0643           Code Status History     Date Active Date Inactive Code Status Order ID Comments User Context   05/17/2020 1758 05/20/2020 1906 DNR [id number]  Terrilee Croak, MD Inpatient   05/06/2020 2101 05/14/2020 1832 DNR [id number]  Shela Leff, MD  Inpatient   05/06/2020 2030 05/06/2020 2101 Full Code [id number]  Shela Leff, MD Inpatient      Family Communication: Updated daughter over the phone today  Disposition Plan:  Status is: Inpatient  Remains inpatient appropriate because:Unsafe d/c plan  Dispo: The patient is from: Home              Anticipated d/c is to: SNF              Patient currently is not medically stable to d/c.   Difficult to place patient  No      Consultants:  Urology  Procedures:  None  Antimicrobials:  Anti-infectives (From admission, onward)    Start     Dose/Rate Route Frequency Ordered Stop   10/04/20 2200  cefTRIAXone (ROCEPHIN) 1 g in sodium chloride 0.9 % 100 mL IVPB        1 g 200 mL/hr over 30 Minutes Intravenous Every 24 hours 10/04/20 0654     10/03/20 2330  cefTRIAXone (ROCEPHIN) 1 g in sodium chloride 0.9 % 100 mL IVPB        1 g 200 mL/hr over 30 Minutes Intravenous  Once 10/03/20 2318 10/04/20 0113        Objective: Vitals:   10/04/20 1833 10/04/20 2157 10/05/20 0323 10/05/20 0518  BP: (!) 116/54 128/66 139/67 133/71  Pulse: 73 79 78 74  Resp: 16 17 17 17   Temp: 98.4 F (36.9 C) (!) 97.5 F (36.4 C) 97.6 F (36.4 C) 98 F (36.7 C)  TempSrc: Oral Oral Oral Oral  SpO2: 100% 98% 96% 96%  Weight:      Height:        Intake/Output Summary (Last 24 hours) at 10/05/2020 1216 Last data filed at 10/05/2020 0500 Gross per 24 hour  Intake --  Output 700 ml  Net -700 ml   Filed Weights   10/03/20 1832  Weight: 72.1 kg    Examination:  General exam: Appears calm and comfortable  Respiratory system: Clear to auscultation. Respiratory effort normal. No respiratory distress. No conversational dyspnea.  Cardiovascular system: S1 & S2 heard, RRR. No murmurs. No pedal edema. Gastrointestinal system: Abdomen is nondistended, soft and nontender. Normal bowel sounds heard. Central nervous system: Alert and oriented. No focal neurological deficits. Speech clear.  Extremities: Symmetric in appearance, no significant joint effusion or erythema  Skin: No rashes, lesions or ulcers on exposed skin  Psychiatry: Judgement and insight appear normal. Mood & affect appropriate.   Data Reviewed: I have personally reviewed following labs and imaging studies  CBC: Recent Labs  Lab 10/03/20 1912 10/04/20 0703 10/05/20 0456  WBC 62.2* 46.4* 34.0*  NEUTROABS 1.8 1.4*  --   HGB 10.7* 9.2* 8.2*  HCT  34.7* 30.1* 27.2*  MCV 102.1* 102.7* 103.0*  PLT 277 191 378   Basic Metabolic Panel: Recent Labs  Lab 09/29/20 1415 10/03/20 1912 10/04/20 0703 10/05/20 0456  NA 141 136 139 139  K 4.4 5.4* 4.6 4.0  CL 107* 104 108 107  CO2 17* 22 24 24   GLUCOSE 137* 118* 99 149*  BUN 24 33* 30* 28*  CREATININE 1.28* 1.17* 1.10* 0.89  CALCIUM 8.8 9.1 8.5* 8.5*   GFR: Estimated Creatinine Clearance: 48 mL/min (by C-G formula based on SCr of 0.89 mg/dL). Liver Function Tests: Recent Labs  Lab 09/29/20 1415 10/03/20 1912 10/04/20 0703  AST 14 32 24  ALT 9 19 15   ALKPHOS 138* 130* 110  BILITOT 0.3 0.4 0.4  PROT  5.1* 6.2* 5.4*  ALBUMIN 3.7 3.4* 2.9*   No results for input(s): LIPASE, AMYLASE in the last 168 hours. No results for input(s): AMMONIA in the last 168 hours. Coagulation Profile: No results for input(s): INR, PROTIME in the last 168 hours. Cardiac Enzymes: Recent Labs  Lab 10/04/20 0703  CKTOTAL 58   BNP (last 3 results) Recent Labs    07/15/20 1057  PROBNP 558   HbA1C: No results for input(s): HGBA1C in the last 72 hours. CBG: No results for input(s): GLUCAP in the last 168 hours. Lipid Profile: No results for input(s): CHOL, HDL, LDLCALC, TRIG, CHOLHDL, LDLDIRECT in the last 72 hours. Thyroid Function Tests: Recent Labs    10/04/20 0703  TSH 3.915   Anemia Panel: No results for input(s): VITAMINB12, FOLATE, FERRITIN, TIBC, IRON, RETICCTPCT in the last 72 hours. Sepsis Labs: Recent Labs  Lab 10/03/20 2140  LATICACIDVEN 1.2    Recent Results (from the past 240 hour(s))  Blood culture (routine x 2)     Status: None (Preliminary result)   Collection Time: 10/03/20  8:20 PM   Specimen: BLOOD RIGHT ARM  Result Value Ref Range Status   Specimen Description   Final    BLOOD RIGHT ARM Performed at [facility], [place] [address] [place], [place] [address]    Special Requests   Final    BOTTLES DRAWN AEROBIC AND ANAEROBIC Blood Culture adequate  volume Performed at [facility] [address], [place], [place] [address]    Culture PENDING  Incomplete   Report Status PENDING  Incomplete  Urine Culture     Status: Abnormal (Preliminary result)   Collection Time: 10/03/20 10:32 PM   Specimen: Urine, Clean Catch  Result Value Ref Range Status   Specimen Description   Final    URINE, CLEAN CATCH Performed at [facility], [place] [address], [place], [place] [address]    Special Requests   Final    NONE Performed at [facility], [place] [address], [place], [place] [address]    Culture (A)  Final    >=100,000 COLONIES/mL ENTEROCOCCUS FAECIUM SUSCEPTIBILITIES TO FOLLOW Performed at [facility], [place] [address] [place],  [address]    Report Status PENDING  Incomplete  Resp Panel by RT-PCR (Flu A&B, Covid) Nasopharyngeal Swab     Status: None   Collection Time: 10/04/20  6:36 AM   Specimen: Nasopharyngeal Swab; Nasopharyngeal(NP) swabs in vial transport medium  Result Value Ref Range Status   SARS Coronavirus 2 by RT PCR NEGATIVE NEGATIVE Final    Comment: (NOTE) SARS-CoV-2 target nucleic acids are NOT DETECTED.  The SARS-CoV-2 RNA is generally detectable in upper respiratory specimens during the acute phase of infection. The lowest concentration of SARS-CoV-2 viral copies this assay can detect is 138 copies/mL. A negative result does not preclude SARS-Cov-2 infection and should not be used as the sole basis for treatment or other patient management decisions. A negative result may occur with  improper specimen collection/handling, submission of specimen other than nasopharyngeal swab, presence of viral mutation(s) within the areas targeted by this assay, and inadequate number of viral copies(<138 copies/mL). A negative result must be combined with clinical observations, patient history, and epidemiological information. The expected result is Negative.  Fact  Sheet for Patients:  EntrepreneurPulse.com.au  Fact Sheet for Healthcare Providers:  IncredibleEmployment.be  This test is no t yet approved or cleared by the [place] FDA and  has been  authorized for detection and/or diagnosis of SARS-CoV-2 by FDA under an Emergency Use Authorization (EUA). This EUA will remain  in effect (meaning this test can be used) for the duration of the COVID-19 declaration under Section 564(b)(1) of the Act, 21 U.S.C.section 360bbb-3(b)(1), unless the authorization is terminated  or revoked sooner.       Influenza A by PCR NEGATIVE NEGATIVE Final   Influenza B by PCR NEGATIVE NEGATIVE Final    Comment: (NOTE) The Xpert Xpress SARS-CoV-2/FLU/RSV plus assay is intended as an aid in the diagnosis of influenza from Nasopharyngeal swab specimens and should not be used as a sole basis for treatment. Nasal washings and aspirates are unacceptable for Xpert Xpress SARS-CoV-2/FLU/RSV testing.  Fact Sheet for Patients: EntrepreneurPulse.com.au  Fact Sheet for Healthcare Providers: IncredibleEmployment.be  This test is not yet approved or cleared by the [place] FDA and has been authorized for detection and/or diagnosis of SARS-CoV-2 by FDA under an Emergency Use Authorization (EUA). This EUA will remain in effect (meaning this test can be used) for the duration of the COVID-19 declaration under Section 564(b)(1) of the Act, 21 U.S.C. section 360bbb-3(b)(1), unless the authorization is terminated or revoked.  Performed at [facility], [place] [address] [place], [place] [address]       Radiology Studies: DG Chest Portable 1 View  Result Date: 10/03/2020 CLINICAL DATA:  Fevers EXAM: PORTABLE CHEST 1 VIEW COMPARISON:  07/15/2020 FINDINGS: Cardiac shadow is within normal limits. The lungs are clear bilaterally. Old rib fractures are noted on the left. No acute  bony abnormality is seen. IMPRESSION: No acute abnormality noted. Electronically Signed   By: Inez Catalina M.D.   On: 10/03/2020 20:47   CT RENAL STONE STUDY  Result Date: 10/04/2020 CLINICAL DATA:  Fever, fatigue and flank pain EXAM: CT ABDOMEN AND PELVIS WITHOUT CONTRAST TECHNIQUE: Multidetector CT imaging of the abdomen and pelvis was performed following the standard protocol without IV contrast. COMPARISON:  05/08/2020 FINDINGS: LOWER CHEST: Normal. HEPATOBILIARY: Normal hepatic contours. No intra- or extrahepatic biliary dilatation. Status post cholecystectomy. PANCREAS: Normal pancreas. No ductal dilatation or peripancreatic fluid collection. SPLEEN: Spleen is enlarged, measuring 13 cm in AP dimension. ADRENALS/URINARY TRACT: The adrenal glands are normal. 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 No right ureteral calculus or hydronephrosis. Unchanged left moderate hydronephrosis without hydroureter. No ureteral calculus. Urinary bladder is normal. STOMACH/BOWEL: There is no hiatal hernia. Normal duodenal course and caliber. No small bowel dilatation or inflammation. No focal colonic abnormality. The appendix is not visualized. No right lower quadrant inflammation or free fluid. VASCULAR/LYMPHATIC: There is calcific atherosclerosis of the abdominal aorta. Unchanged left para-aortic lymphadenopathy. Numerous small mesenteric lymph nodes also appear unchanged. REPRODUCTIVE: Status post hysterectomy. No adnexal mass. MUSCULOSKELETAL. No bony spinal canal stenosis or focal osseous abnormality. OTHER: None. IMPRESSION: 1. No acute abnormality of the abdomen or pelvis. 2. Unchanged left moderate hydronephrosis without hydroureter. 3. 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 4. Unchanged appearance of retroperitoneal and mesenteric lymphadenopathy. 5. Mild splenomegaly. Aortic Atherosclerosis (ICD10-I70.0). Electronically Signed   By: Ulyses Jarred M.D.   On: 10/04/2020 02:12      Scheduled  Meds:  busPIRone  15 mg Oral BID   enoxaparin (LOVENOX) injection  40 mg Subcutaneous Q24H   ezetimibe  10 mg Oral Daily   levothyroxine  25 mcg Oral Daily   pantoprazole  20 mg Oral Daily   polyethylene glycol  17 g Oral Daily   predniSONE  20 mg Oral Q breakfast   saccharomyces boulardii  250 mg Oral BID   venlafaxine XR  150 mg Oral Daily   Continuous Infusions:  cefTRIAXone (ROCEPHIN)  IV 1 g (10/04/20 2235)     LOS: 0 days      Time spent: 25 minutes   Dessa Phi, DO [facility] 10/05/2020, 12:16 PM   Available via Epic secure chat 7am-7pm After these hours, please refer to coverage provider listed on [email or web address]

## 2020-10-06 ENCOUNTER — Other Ambulatory Visit: Payer: Self-pay

## 2020-10-06 LAB — CBC
HCT: 29.3 % — ABNORMAL LOW (ref 36.0–46.0)
Hemoglobin: 9 g/dL — ABNORMAL LOW (ref 12.0–15.0)
MCH: 31.9 pg (ref 26.0–34.0)
MCHC: 30.7 g/dL (ref 30.0–36.0)
MCV: 103.9 fL — ABNORMAL HIGH (ref 80.0–100.0)
Platelets: 228 10*3/uL (ref 150–400)
RBC: 2.82 MIL/uL — ABNORMAL LOW (ref 3.87–5.11)
RDW: 16.2 % — ABNORMAL HIGH (ref 11.5–15.5)
WBC: 39.1 10*3/uL — ABNORMAL HIGH (ref 4.0–10.5)
nRBC: 0.1 % (ref 0.0–0.2)

## 2020-10-06 LAB — BASIC METABOLIC PANEL
Anion gap: 9 (ref 5–15)
BUN: 24 mg/dL — ABNORMAL HIGH (ref 8–23)
CO2: 26 mmol/L (ref 22–32)
Calcium: 8.7 mg/dL — ABNORMAL LOW (ref 8.9–10.3)
Chloride: 106 mmol/L (ref 98–111)
Creatinine, Ser: 0.8 mg/dL (ref 0.44–1.00)
GFR, Estimated: >60 mL/min (ref >60)
Glucose, Bld: 103 mg/dL — ABNORMAL HIGH (ref 70–99)
Potassium: 4.2 mmol/L (ref 3.5–5.1)
Sodium: 141 mmol/L (ref 135–145)

## 2020-10-06 LAB — URINE CULTURE: Culture: >=100000 — AB

## 2020-10-06 MED ORDER — LINEZOLID 600 MG PO TABS
600.0000 mg | ORAL_TABLET | Freq: Two times a day (BID) | ORAL | Status: DC
  Administered 2020-10-06 – 2020-10-08 (×5): 600 mg via ORAL
  Filled 2020-10-06 (×5): qty 1

## 2020-10-06 NOTE — TOC Initial Note (Signed)
Transition of Care [facility]) - Initial/Assessment Note    Patient Details  Name: Sarah [name] MRN: [id number] Date of Birth: 01-May-1940  Transition of [facility]) CM/SW Contact:    Lynnell Catalan, RN Phone Number: 10/06/2020, 10:16 AM  Clinical Narrative:                 Spoke with pt at bedside for dc planning. Pt lives with her daughter and son in law but feels she might need SNF at this time. FL2 faxed out for bed offers.  Expected Discharge Plan: Skilled Nursing Facility Barriers to Discharge: Continued Medical Work up   Patient Goals and CMS Choice Patient states their goals for this hospitalization and ongoing recovery are:: To get stronger      Expected Discharge Plan and Services Expected Discharge Plan: [place]   Discharge Planning Services: CM Consult   Living arrangements for the past 2 months: Single Family Home                  Prior Living Arrangements/Services Living arrangements for the past 2 months: Single Family Home Lives with:: Adult Children Patient language and need for interpreter reviewed:: Yes        Need for Family Participation in Patient Care: Yes (Comment) Care giver support system in place?: Yes (comment)   Criminal Activity/Legal Involvement Pertinent to Current Situation/Hospitalization: No - Comment as needed  Activities of Daily Living Home Assistive Devices/Equipment: Cane (specify quad or straight), Eyeglasses, Walker (specify type) (single point cane, front wheeled walker) ADL Screening (condition at time of admission) Patient's cognitive ability adequate to safely complete daily activities?: Yes Is the patient deaf or have difficulty hearing?: No Does the patient have difficulty seeing, even when wearing glasses/contacts?: No Does the patient have difficulty concentrating, remembering, or making decisions?: No Patient able to express need for assistance with ADLs?: Yes Does the patient have difficulty dressing or  bathing?: Yes Independently performs ADLs?: No Communication: Independent Dressing (OT): Needs assistance Is this a change from baseline?: Change from baseline, expected to last >3 days Grooming: Independent Feeding: Independent Bathing: Needs assistance Is this a change from baseline?: Change from baseline, expected to last >3 days Toileting: Needs assistance Is this a change from baseline?: Change from baseline, expected to last >3days In/Out Bed: Needs assistance Is this a change from baseline?: Change from baseline, expected to last >3 days Walks in Home: Needs assistance Is this a change from baseline?: Change from baseline, expected to last >3 days Does the patient have difficulty walking or climbing stairs?: Yes (secondary to weakness and joint pain) Weakness of Legs: Both Weakness of Arms/Hands: None  Permission Sought/Granted Permission sought to share information with : Chartered certified accountant granted to share information with : Yes, Verbal Permission Granted     Permission granted to share info w AGENCY: [employer] in the hub        Emotional Assessment Appearance:: Appears stated age Attitude/Demeanor/Rapport: Gracious Affect (typically observed): Calm Orientation: : Oriented to Self, Oriented to Place, Oriented to  Time, Oriented to Situation   Psych Involvement: No (comment)  Admission diagnosis:  CLL (chronic lymphocytic leukemia) (HCC) 0000000 Complicated UTI (urinary tract infection) [N39.0] Fever, unspecified fever cause [R50.9] Sepsis, due to unspecified organism, unspecified whether acute organ dysfunction present [facility]) [A41.9] Patient Active Problem List   Diagnosis Date Noted   Complicated UTI (urinary tract infection) 10/04/2020   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[place]) 10/04/2020   Arthralgia 10/01/2020   Elevated temperature 08/11/2020  Skin lesion of back 08/11/2020   Right upper quadrant abdominal pain 08/11/2020   Hypertension    Abnormal  vision of both eyes 07/19/2020   Pain aggravated by walking 07/19/2020   Memory difficulties 07/18/2020   Non Hodgkin's lymphoma ([place])    History of heart attack    Dyspnea    Arthritis    Anxiety    Elevated TSH 06/10/2020   Fatigue 06/10/2020   Other general symptoms and signs  06/10/2020   Coronary artery disease 06/09/2020   HLD (hyperlipidemia)    Gout    GERD (gastroesophageal reflux disease)    Allergy    Hospital discharge follow-up 05/25/2020   Hypokalemia 05/25/2020   AKI (acute kidney injury) ([place]) 05/06/2020   UTI (urinary tract infection) 05/06/2020   Hypotension 05/06/2020   Other specified anemias 12/15/2019   Acute cystitis with hematuria 12/15/2019   Macrocytic anemia 12/15/2019   Allergic 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: [phone]: [phone]    Social Determinants of Health (SDOH) Interventions    Readmission Risk Interventions Readmission Risk Prevention Plan 10/06/2020 05/20/2020  Transportation Screening Complete Complete  PCP or Specialist Appt within 3-5 Days - Complete  HRI or [place]- Complete  Social Work Consult for [place]Counseling - Complete  Palliative Care Screening - Not Applicable  Medication Review (Press photographer Complete Complete  PCP or Specialist appointment within 3-5 days of discharge Complete -  [place]or Home Care Consult Complete -  SW Recovery Care/Counseling  Consult Complete -  Palliative Care Screening Not Applicable -  [place]Complete -

## 2020-10-06 NOTE — NC FL2 (Signed)
[place] LEVEL OF CARE SCREENING TOOL     IDENTIFICATION  Patient Name: Sarah [name] Birthdate: [birth year] Sex: female Admission Date (Current Location): 10/03/2020  [facility] and [place] Number:  Herbalist and Address:  [facility],  [place] [address] [place]      Provider Number: [id number]  Attending Physician Name and Address:  Darliss Cheney, MD  Relative Name and Phone Number:       Current Level of Care: Hospital Recommended Level of Care: [place] Prior Approval Number:    Date Approved/Denied:   PASRR Number: [id number]  Discharge Plan: SNF    Current Diagnoses: Patient Active Problem List   Diagnosis Date Noted   Complicated UTI (urinary tract infection) 10/04/2020   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[place]) 10/04/2020   Arthralgia 10/01/2020   Elevated temperature 08/11/2020   Skin lesion of back 08/11/2020   Right upper quadrant abdominal pain 08/11/2020   Hypertension    Abnormal vision of both eyes 07/19/2020   Pain aggravated by walking 07/19/2020   Memory difficulties 07/18/2020   Non Hodgkin's lymphoma ([place])    History of heart attack    Dyspnea    Arthritis    Anxiety    Elevated TSH 06/10/2020   Fatigue 06/10/2020   Other general symptoms and signs  06/10/2020   Coronary artery disease 06/09/2020   HLD (hyperlipidemia)    Gout    GERD (gastroesophageal reflux disease)    Allergy    Hospital discharge follow-up 05/25/2020   Hypokalemia 05/25/2020   AKI (acute kidney injury) ([place]) 05/06/2020   UTI (urinary tract infection) 05/06/2020   Hypotension 05/06/2020   Other specified anemias 12/15/2019   Acute cystitis with hematuria 12/15/2019   Macrocytic anemia 12/15/2019   Allergic rhinitis due to pollen 12/08/2019   Elevated cholesterol 12/08/2019   History of myocardial infarction 12/08/2019   Urinary frequency 12/08/2019   CLL (chronic lymphocytic leukemia) ([place]) 12/08/2019   History of  gout 12/08/2019   History of MI (myocardial infarction) 12/08/2019    Orientation RESPIRATION BLADDER Height & Weight     Self, Time, Situation, Place  Normal Incontinent Weight: 72.1 kg Height:  '5\' 3"'$  (160 cm)  BEHAVIORAL SYMPTOMS/MOOD NEUROLOGICAL BOWEL NUTRITION STATUS      Continent Diet (Heart healthy)  AMBULATORY STATUS COMMUNICATION OF NEEDS Skin   Extensive Assist Verbally Other (Comment) (Scratches to buttocks)                       Personal Care Assistance Level of Assistance  Bathing, Feeding, Dressing Bathing Assistance: Limited assistance Feeding assistance: Independent Dressing Assistance: Limited assistance     Functional Limitations Info  Sight, Hearing, Speech Sight Info: Impaired (Wears glasses) Hearing Info: Impaired Speech Info: Adequate    SPECIAL CARE FACTORS FREQUENCY  PT (By licensed PT), OT (By licensed OT)     PT Frequency: 5 x weekly OT Frequency: 5 x weekly            Contractures Contractures Info: Not present    Additional Factors Info  Code Status, Allergies Code Status Info: DNR Allergies Info: Amoxicillin, Augmentin (Amoxicillin-pot Clavulanate), Drug (Tape), Sulfa Antibiotics, Atenolol, Macrodantin (Nitrofurantoin)           Current Medications (10/06/2020):  This is the current hospital active medication list Current Facility-Administered Medications  Medication Dose Route Frequency Provider Last Rate Last Admin   acetaminophen (TYLENOL) tablet 650 mg  650 mg Oral Q6H PRN Hal Hope,  Doreatha Lew, MD       Or   acetaminophen (TYLENOL) suppository 650 mg  650 mg Rectal Q6H PRN Rise Patience, MD       busPIRone (BUSPAR) tablet 15 mg  15 mg Oral BID Rise Patience, MD   15 mg at 10/05/20 2229   docusate sodium (COLACE) capsule 100 mg  100 mg Oral BID PRN Dessa Phi, DO   100 mg at 10/05/20 2231   enoxaparin (LOVENOX) injection 40 mg  40 mg Subcutaneous Q24H Dessa Phi, DO   40 mg at 10/05/20 1029    ezetimibe (ZETIA) tablet 10 mg  10 mg Oral Daily Rise Patience, MD   10 mg at 10/05/20 1029   HYDROcodone-acetaminophen (NORCO/VICODIN) 5-325 MG per tablet 1 tablet  1 tablet Oral Q6H PRN Rise Patience, MD   1 tablet at 10/05/20 2233   levothyroxine (SYNTHROID) tablet 25 mcg  25 mcg Oral Daily Dessa Phi, DO   25 mcg at 10/06/20 0600   linezolid (ZYVOX) tablet 600 mg  600 mg Oral Q12H Pahwani, Einar Grad, MD       pantoprazole (PROTONIX) EC tablet 20 mg  20 mg Oral Daily Rise Patience, MD   20 mg at 10/05/20 1028   polyethylene glycol (MIRALAX / GLYCOLAX) packet 17 g  17 g Oral Daily Dessa Phi, DO   17 g at 10/05/20 1030   predniSONE (DELTASONE) tablet 20 mg  20 mg Oral Q breakfast Dessa Phi, DO   20 mg at 10/06/20 [id number]   saccharomyces boulardii (FLORASTOR) capsule 250 mg  250 mg Oral BID Rise Patience, MD   250 mg at 10/05/20 2230   venlafaxine XR (EFFEXOR-XR) 24 hr capsule 150 mg  150 mg Oral Daily Rise Patience, MD   150 mg at 10/05/20 1030     Discharge Medications: Please see discharge summary for a list of discharge medications.  Relevant Imaging Results:  Relevant Lab Results:   Additional Information SS# [id number]  Covid vaccination x 3  Chivas Notz, Marjie Skiff, RN

## 2020-10-06 NOTE — Progress Notes (Signed)
PROGRESS NOTE    Sarah [name]  MAY:[id number] DOB: Sep 28, 1940 DOA: 10/03/2020 PCP: Libby Maw, MD   Brief Narrative:  Sarah [name] is a 80 y.o. female with history of CLL, breast cancer in remission, CAD, hypothyroidism, hyperlipidemia presents to the ER because of joint pains involving symmetrically the knee ankle wrist and hand.  She states that about 3 weeks ago, she had bilateral eye redness, irritation and saw an ophthalmologist.  She was diagnosed with viral syndrome, given oral doxycycline to ward off bacterial superinfection.  This caused her to have diarrhea.  Antibiotic was decreased.  She then developed acute onset of diffuse joint pain.  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  This presented in bilateral feet, ankles, knees, wrists as well as knuckles specifically right MTP and left DIP, as well as her neck.  She has never had this sort of joint pain in the past.  Also admits to dysuria.  She is not sexually active.  Diarrhea has now resolved.  Assessment & Plan:   Principal Problem:   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[place]) Active Problems:   HLD (hyperlipidemia)   Coronary artery disease   Hypertension   Arthralgia   Complicated UTI (urinary tract infection)   Reactive arthritis -Concern for reactive arthritis in setting of recent illness/UTI, with extra-articular signs such as conjunctivitis/uveitis, diarrhea, GU infection.  CRP, sed rate remain elevated but rest of the rheumatological work-up was negative which includes rheumatoid factor, ANA, anti-CCP however HLA-B27 is pending.  Per patient, her pain is improving.  Continue current pain management.  PT recommends SNF.  TOC on board.  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 Gloriann Loan and now s/p left ureteral stent removal.  -Urology consulted, patient does not have  obstruction of left kidney at this point.  No current indication for intervention.  Recommended continued antibiotics.  Her urine culture is now growing VRE, antibiotics transition to Zyvox.  History of CLL -Followed by Dr. Lorenso Courier   Hypothyroidism -Continue Synthroid   Depression -Continue Buspar, effexor   DVT prophylaxis: enoxaparin (LOVENOX) injection 40 mg Start: 10/04/20 1200   Code Status: DNR  Family Communication:  None present at bedside.  Plan of care discussed with patient in length and he verbalized understanding and agreed with it.  Status is: Inpatient  Remains inpatient appropriate because:Inpatient level of care appropriate due to severity of illness  Dispo: The patient is from: Home              Anticipated d/c is to: SNF              Patient currently is not medically stable to d/c.   Difficult to place patient No        Estimated body mass index is 28.16 kg/m as calculated from the following:   Height as of this encounter: _0  (1.6 m).   Weight as of this encounter: 72.1 kg.     Nutritional Assessment: Body mass index is 28.16 kg/m.Marland Kitchen Seen by dietician.  I agree with the assessment and plan as outlined below: Nutrition Status:        .  Skin Assessment: I have examined the patient's skin and I agree with the wound assessment as performed by the wound care RN as outlined below:    Consultants:  None  Procedures:  None  Antimicrobials:  Anti-infectives (  From admission, onward)    Start     Dose/Rate Route Frequency Ordered Stop   10/06/20 1100  linezolid (ZYVOX) tablet 600 mg        600 mg Oral Every 12 hours 10/06/20 0955     10/05/20 2200  amoxicillin (AMOXIL) capsule 500 mg  Status:  Discontinued        500 mg Oral Every 8 hours 10/05/20 1220 10/06/20 0955   10/04/20 2200  cefTRIAXone (ROCEPHIN) 1 g in sodium chloride 0.9 % 100 mL IVPB  Status:  Discontinued        1 g 200 mL/hr over 30 Minutes Intravenous Every 24 hours  10/04/20 0654 10/05/20 1220   10/03/20 2330  cefTRIAXone (ROCEPHIN) 1 g in sodium chloride 0.9 % 100 mL IVPB        1 g 200 mL/hr over 30 Minutes Intravenous  Once 10/03/20 2318 10/04/20 0113          Subjective: Seen and examined.  Still complains of pain in the joints in bilateral hands and knees.  No other complaint.  Pain improving.  Objective: Vitals:   10/05/20 1231 10/05/20 2015 10/06/20 0534 10/06/20 1343  BP: 119/65 (!) 136/59 (!) 158/89 125/72  Pulse: 79 81 78 79  Resp: _0 Temp: 98.9 F (37.2 C) 98.6 F (37 C) 97.9 F (36.6 C) 98.2 F (36.8 C)  TempSrc: Oral Oral Oral Oral  SpO2: 97% 98% 98% 97%  Weight:      Height:        Intake/Output Summary (Last 24 hours) at 10/06/2020 1415 Last data filed at 10/06/2020 1093 Gross per 24 hour  Intake 240 ml  Output 1000 ml  Net -760 ml   Filed Weights   10/03/20 1832  Weight: 72.1 kg    Examination:  General exam: Appears calm and comfortable  Respiratory system: Clear to auscultation. Respiratory effort normal. Cardiovascular system: S1 & S2 heard, RRR. No JVD, murmurs, rubs, gallops or clicks.  Trace pitting edema bilateral lower extremity. Gastrointestinal system: Abdomen is nondistended, soft and nontender. No organomegaly or masses felt. Normal bowel sounds heard. Central nervous system: Alert and oriented. No focal neurological deficits. Extremities: Symmetric 5 x 5 power. Skin: No rashes, lesions or ulcers Psychiatry: Judgement and insight appear normal. Mood & affect appropriate.    Data Reviewed: I have personally reviewed following labs and imaging studies  CBC: Recent Labs  Lab 10/03/20 1912 10/04/20 0703 10/05/20 0456 10/06/20 0538  WBC 62.2* 46.4* 34.0* 39.1*  NEUTROABS 1.8 1.4*  --   --   HGB 10.7* 9.2* 8.2* 9.0*  HCT 34.7* 30.1* 27.2* 29.3*  MCV 102.1* 102.7* 103.0* 103.9*  PLT 277 191 205 235   Basic Metabolic Panel: Recent Labs  Lab 10/03/20 1912 10/04/20 0703  10/05/20 0456 10/06/20 0538  NA 136 139 139 141  K 5.4* 4.6 4.0 4.2  CL 104 108 107 106  CO2 _1 GLUCOSE 118* 99 149* 103*  BUN 33* 30* 28* 24*  CREATININE 1.17* 1.10* 0.89 0.80  CALCIUM 9.1 8.5* 8.5* 8.7*   GFR: Estimated Creatinine Clearance: 53.4 mL/min (by C-G formula based on SCr of 0.8 mg/dL). Liver Function Tests: Recent Labs  Lab 10/03/20 1912 10/04/20 0703  AST 32 24  ALT 19 15  ALKPHOS 130* 110  BILITOT 0.4 0.4  PROT 6.2* 5.4*  ALBUMIN 3.4* 2.9*   No results for input(s): LIPASE, AMYLASE in the last 168 hours. No results  for input(s): AMMONIA in the last 168 hours. Coagulation Profile: No results for input(s): INR, PROTIME in the last 168 hours. Cardiac Enzymes: Recent Labs  Lab 10/04/20 0703  CKTOTAL 58   BNP (last 3 results) Recent Labs    07/15/20 1057  PROBNP 558   HbA1C: No results for input(s): HGBA1C in the last 72 hours. CBG: No results for input(s): GLUCAP in the last 168 hours. Lipid Profile: No results for input(s): CHOL, HDL, LDLCALC, TRIG, CHOLHDL, LDLDIRECT in the last 72 hours. Thyroid Function Tests: Recent Labs    10/04/20 0703  TSH 3.915   Anemia Panel: No results for input(s): VITAMINB12, FOLATE, FERRITIN, TIBC, IRON, RETICCTPCT in the last 72 hours. Sepsis Labs: Recent Labs  Lab 10/03/20 2140  LATICACIDVEN 1.2    Recent Results (from the past 240 hour(s))  Blood culture (routine x 2)     Status: None (Preliminary result)   Collection Time: 10/03/20  8:20 PM   Specimen: BLOOD RIGHT ARM  Result Value Ref Range Status   Specimen Description   Final    BLOOD RIGHT ARM Performed at [facility], [place] [address] [place], [place] [address]    Special Requests   Final    BOTTLES DRAWN AEROBIC AND ANAEROBIC Blood Culture adequate volume Performed at [place] [address] [place], [place] [address]    Culture   Final    NO GROWTH 2 DAYS Performed at [place] [address] [place], [place] [address]    Report Status PENDING  Incomplete  Urine Culture     Status: Abnormal   Collection Time: 10/03/20 10:32 PM   Specimen: Urine, Clean Catch  Result Value Ref Range Status   Specimen Description   Final    URINE, CLEAN CATCH Performed at [facility], [place] [address] [place], [place] [address]    Special Requests   Final    NONE Performed at [facility], [place] [address], [place], [place] [address]    Culture (A)  Final    >=100,000 COLONIES/mL ENTEROCOCCUS FAECIUM VANCOMYCIN RESISTANT ENTEROCOCCUS    Report Status 10/06/2020 FINAL  Final   Organism ID, Bacteria ENTEROCOCCUS FAECIUM (A)  Final      Susceptibility   Enterococcus faecium - MIC*    AMPICILLIN >=32 RESISTANT Resistant     NITROFURANTOIN 64 INTERMEDIATE Intermediate     VANCOMYCIN >=32 RESISTANT Resistant     * >=100,000 COLONIES/mL ENTEROCOCCUS FAECIUM  Blood culture (routine x 2)     Status: None (Preliminary result)   Collection Time: 10/04/20  2:52 AM   Specimen: BLOOD  Result Value Ref Range Status   Specimen Description   Final    BLOOD BLOOD LEFT HAND Performed at [place] [address], [place], [place] [address]    Special Requests   Final    BOTTLES DRAWN AEROBIC AND ANAEROBIC Blood Culture adequate volume Performed at [place] [address] [place], [place] [address]    Culture   Final    NO GROWTH 2 DAYS Performed at [place] [address] [place], [place] [address]    Report Status PENDING  Incomplete  Resp Panel by RT-PCR (Flu A&B, Covid) Nasopharyngeal Swab     Status: None   Collection Time: 10/04/20  6:36 AM   Specimen: Nasopharyngeal Swab; Nasopharyngeal(NP) swabs in vial transport medium  Result Value Ref Range Status  SARS Coronavirus 2 by RT PCR NEGATIVE NEGATIVE Final    Comment: (NOTE) SARS-CoV-2 target nucleic acids are NOT DETECTED.  The  SARS-CoV-2 RNA is generally detectable in upper respiratory specimens during the acute phase of infection. The lowest concentration of SARS-CoV-2 viral copies this assay can detect is 138 copies/mL. A negative result does not preclude SARS-Cov-2 infection and should not be used as the sole basis for treatment or other patient management decisions. A negative result may occur with  improper specimen collection/handling, submission of specimen other than nasopharyngeal swab, presence of viral mutation(s) within the areas targeted by this assay, and inadequate number of viral copies(<138 copies/mL). A negative result must be combined with clinical observations, patient history, and epidemiological information. The expected result is Negative.  Fact Sheet for Patients:  EntrepreneurPulse.com.au  Fact Sheet for Healthcare Providers:  IncredibleEmployment.be  This test is no t yet approved or cleared by the [place] FDA and  has been authorized for detection and/or diagnosis of SARS-CoV-2 by FDA under an Emergency Use Authorization (EUA). This EUA will remain  in effect (meaning this test can be used) for the duration of the COVID-19 declaration under Section 564(b)(1) of the Act, 21 U.S.C.section 360bbb-3(b)(1), unless the authorization is terminated  or revoked sooner.       Influenza A by PCR NEGATIVE NEGATIVE Final   Influenza B by PCR NEGATIVE NEGATIVE Final    Comment: (NOTE) The Xpert Xpress SARS-CoV-2/FLU/RSV plus assay is intended as an aid in the diagnosis of influenza from Nasopharyngeal swab specimens and should not be used as a sole basis for treatment. Nasal washings and aspirates are unacceptable for Xpert Xpress SARS-CoV-2/FLU/RSV testing.  Fact Sheet for Patients: EntrepreneurPulse.com.au  Fact Sheet for Healthcare Providers: IncredibleEmployment.be  This test is not yet approved or  cleared by the [place] FDA and has been authorized for detection and/or diagnosis of SARS-CoV-2 by FDA under an Emergency Use Authorization (EUA). This EUA will remain in effect (meaning this test can be used) for the duration of the COVID-19 declaration under Section 564(b)(1) of the Act, 21 U.S.C. section 360bbb-3(b)(1), unless the authorization is terminated or revoked.  Performed at [facility], [place] [address] [place], [place] [address]       Radiology Studies: No results found.  Scheduled Meds:  busPIRone  15 mg Oral BID   enoxaparin (LOVENOX) injection  40 mg Subcutaneous Q24H   ezetimibe  10 mg Oral Daily   levothyroxine  25 mcg Oral Daily   linezolid  600 mg Oral Q12H   pantoprazole  20 mg Oral Daily   polyethylene glycol  17 g Oral Daily   predniSONE  20 mg Oral Q breakfast   saccharomyces boulardii  250 mg Oral BID   venlafaxine XR  150 mg Oral Daily   Continuous Infusions:   LOS: 1 day   Time spent: 35 minutes   Darliss Cheney, MD [facility]  10/06/2020, 2:15 PM   How to contact the [facility] Attending or Consulting provider [place] or covering provider during after hours [place], for this patient?  Check the care team in [facility] and look for a) attending/consulting TRH provider listed and b) the [facility] team listed. Page or secure chat [phone]. Log into [email or web address] and use [place]'s universal password to access. If you do not have the password, please contact the hospital operator. Locate the [facility] provider you are looking for under [facility] and page to a number that you  can be directly reached. If you still have difficulty reaching the provider, please page the [facility] (Director on Call) for the Hospitalists listed on amion for assistance.

## 2020-10-06 NOTE — Evaluation (Signed)
Occupational Therapy Evaluation Patient Details Name: Sarah [name] MRN: [id number] DOB: 01/11/1941 Today's Date: 10/06/2020    History of Present Illness 80 y.o. female  admitted with joint pains involving symmetrically the knee ankle wrist and hand.  Patient states about 3 weeks ago she had some eye congestion which was treated with drops and patient also was given oral antibiotic following which patient developed diarrhea and joint pain, difficulty walking, dysuria.  Dx of reactive arthritis, UTI. Pt with history of CLL, breast cancer in remission, CAD, hypothyroidism, hyperlipidemia.   Clinical Impression   Patient lives with daughter and son in law, able to provide limited physical assistance. At baseline mod I with BADLs and uses rollator for ambulation. Currently patient limited 2* pain in bilateral wrist, hands and ankles despite premedicated with prednisone. Patient needing mod A x1-2 to power up to standing with cues for body mechanics. Initial posterior lean with cues to correct, mod x2 to manage turning walker and take small shuffled steps to [facility] with patient leaning posteriorly "iIm going to fall" needing reassurance. Heavily reliant on B UE support needing total A for perianal care after bowel movement. Chair brought up behind patient after using commode needing assist to let go of walker and reach back for chair. Notified RN to use stedy back to bed as patient having difficulty pivoting feet but doing well with static standing with 1-2 assist. Recommend rehab prior to D/C home as patient needing increased assist with all self care and family only able to provide limited physical help. Acute OT to follow.     Follow Up Recommendations  SNF    Equipment Recommendations  None recommended by OT       Precautions / Restrictions Precautions Precautions: Fall Precaution Comments: denies falls in the past 1 year Restrictions Weight Bearing Restrictions: No      Mobility Bed  Mobility Overal bed mobility: Needs Assistance Bed Mobility: Rolling;Sidelying to Sit Rolling: Min assist Sidelying to sit: Mod assist;HOB elevated       General bed mobility comments: mod A to elevate trunk    Transfers Overall transfer level: Needs assistance Equipment used: Rolling walker (2 wheeled) Transfers: Sit to/from [facility] Sit to Stand: Mod assist;+2 safety/equipment Stand pivot transfers: Mod assist;+2 physical assistance;+2 safety/equipment       General transfer comment: please see toilet transfer in ADL section; posterior lean, impaired balance, increased pain with weight bearing    Balance Overall balance assessment: Needs assistance Sitting-balance support: Feet supported Sitting balance-Leahy Scale: Fair   Postural control: Posterior lean Standing balance support: Bilateral upper extremity supported Standing balance-Leahy Scale: Poor Standing balance comment: reliant on UE support and mod A x1-2                           ADL either performed or assessed with clinical judgement   ADL Overall ADL's : Needs assistance/impaired     Grooming: Set up;Sitting   Upper Body Bathing: Minimal assistance;Sitting   Lower Body Bathing: Maximal assistance;Sitting/lateral leans;Sit to/from stand   Upper Body Dressing : Minimal assistance;Sitting   Lower Body Dressing: Total assistance;Sitting/lateral leans;Sit to/from stand   Toilet Transfer: Moderate assistance;+2 for physical assistance;+2 for safety/equipment;Stand-pivot;Cueing for sequencing;Cueing for safety;BSC;RW Toilet Transfer Details (indicate cue type and reason): mod A x1-2 to power up to standing, two assist to safely pivot to bedside commode as patient having difficulty clearing feet off floor to take full step towards BSC, posterior lean  Toileting- Clothing Manipulation and Hygiene: Total assistance;Sit to/from stand Toileting - Clothing Manipulation Details (indicate  cue type and reason): due to B UE support on walker, posterior lean, limited standing tolerance     Functional mobility during ADLs: Moderate assistance;+2 for physical assistance;+2 for safety/equipment;Cueing for safety;Cueing for sequencing;Rolling walker General ADL Comments: patient needing increased assist from baseline for functional transfers and ADL tasks due to ankle and hand/wrist pain, impaired balance                         Pertinent Vitals/Pain Pain Assessment: Faces Faces Pain Scale: Hurts even more Pain Location: hands, ankles Pain Descriptors / Indicators: Aching Pain Intervention(s): Premedicated before session     Hand Dominance Right   Extremity/Trunk Assessment Upper Extremity Assessment Upper Extremity Assessment: Generalized weakness   Lower Extremity Assessment Lower Extremity Assessment: Defer to PT evaluation   Cervical / Trunk Assessment Cervical / Trunk Assessment: Normal   Communication Communication Communication: No difficulties   Cognition Arousal/Alertness: Awake/alert Behavior During Therapy: WFL for tasks assessed/performed Overall Cognitive Status: Within Functional Limits for tasks assessed                                                Home Living Family/patient expects to be discharged to:: Private residence Living Arrangements: Children Available Help at Discharge: Available 24 hours/day Type of Home: House Home Access: Stairs to enter [employer] of Steps: 2 Entrance Stairs-Rails: None Home Layout: Able to live on main level with bedroom/bathroom;Two level     Bathroom Shower/Tub: Teacher, early years/pre: Handicapped height Bathroom Accessibility: Yes   Home Equipment: Environmental consultant - 4 wheels;Shower seat;Bedside commode;Cane - single point          Prior Functioning/Environment Level of Independence: Independent with assistive device(s)        Comments: Uses rollator.  Sponge bathes independently.  Daughter has fibromyalgia, daughter can help minimally as long as she doesn't exert herself. Son-in-law able to help getting into and out of house.        OT Problem List: Decreased strength;Decreased activity tolerance;Impaired balance (sitting and/or standing);Pain      OT Treatment/Interventions: Self-care/ADL training;DME and/or AE instruction;Balance training;Patient/family education;Therapeutic activities    OT Goals(Current goals can be found in the care plan section) Acute Rehab OT Goals Patient Stated Goal: to reduce pain, be able to walk OT Goal Formulation: With patient Time For Goal Achievement: 10/20/20 Potential to Achieve Goals: Good  OT Frequency: Min 2X/week    AM-PAC OT "6 Clicks" Daily Activity     Outcome Measure Help from another person eating meals?: None Help from another person taking care of personal grooming?: A Little Help from another person toileting, which includes using toliet, bedpan, or urinal?: A Lot Help from another person bathing (including washing, rinsing, drying)?: A Lot Help from another person to put on and taking off regular upper body clothing?: A Little Help from another person to put on and taking off regular lower body clothing?: Total 6 Click Score: 15   End of Session Equipment Utilized During Treatment: Rolling walker;Gait belt Nurse Communication: Mobility status;Other (comment) (use of stedy to get back to bed)  Activity Tolerance: Patient limited by pain;Patient tolerated treatment well Patient left: in chair;with call bell/phone within reach;with chair alarm set  OT Visit Diagnosis:  Unsteadiness on feet (R26.81);Other abnormalities of gait and mobility (R26.89);Muscle weakness (generalized) (M62.81);Pain Pain - Right/Left:  (bilateral) Pain - part of body: Hand;Ankle and joints of foot                Time: [id number] OT Time Calculation (min): 23 min Charges:  OT General Charges $OT Visit: 1  Visit OT Evaluation $OT Eval Low Complexity: 1 Low OT Treatments $Self Care/Home Management : 8-22 mins  Delbert Phenix OT OT pager: [place] 10/06/2020, 1:17 PM

## 2020-10-07 LAB — HIV ANTIBODY (ROUTINE TESTING W REFLEX): HIV Screen 4th Generation wRfx: NONREACTIVE

## 2020-10-07 NOTE — TOC Progression Note (Signed)
Transition of Care [facility]) - Progression Note    Patient Details  Name: Sayesha Mcfarlain MRN: [id number] Date of Birth: Jun 21, 1940  Transition of Care [facility]) CM/SW Contact  Kamilya Wakeman, Marjie Skiff, RN Phone Number: 10/07/2020, 2:20 PM  Clinical Narrative:     Pt given SNF bed offers and provided with Medicare.gov list. After speaking with daughter pt has decided to choose [place]. [facility] liaison contacted to accept bed for pt. Plan is for pt to dc to [facility] tomorrow. Covid test to be ordered by MD.  Expected Discharge Plan: [place] Barriers to Discharge: Continued Medical Work up  Expected Discharge Plan and Services Expected Discharge Plan: [place]   Discharge Planning Services: CM Consult   Living arrangements for the past 2 months: Single Family Home                   Readmission Risk Interventions Readmission Risk Prevention Plan 10/06/2020 05/20/2020  Transportation Screening Complete Complete  PCP or Specialist Appt within 3-5 Days - Complete  HRI or [place] - Complete  Social Work Consult for [place] Planning/Counseling - Complete  Palliative Care Screening - Not Applicable  Medication Review Press photographer) Complete Complete  PCP or Specialist appointment within 3-5 days of discharge Complete -  [place] or Home Care Consult Complete -  SW Recovery Care/Counseling Consult Complete -  Palliative Care Screening Not Applicable -  [place] Complete -

## 2020-10-07 NOTE — Progress Notes (Signed)
PROGRESS NOTE    Sarah [name]  [id number] DOB: [birth year] DOA: 10/03/2020 PCP: Libby Maw, MD   Brief Narrative:  Sarah [name] is a 80 y.o. female with history of CLL, breast cancer in remission, CAD, hypothyroidism, hyperlipidemia presents to the ER because of joint pains involving symmetrically the knee ankle wrist and hand.  She states that about 3 weeks ago, she had bilateral eye redness, irritation and saw an ophthalmologist.  She was diagnosed with viral syndrome, given oral doxycycline to ward off bacterial superinfection.  This caused her to have diarrhea.  Antibiotic was decreased.  She then developed acute onset of diffuse joint pain.  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  This presented in bilateral feet, ankles, knees, wrists as well as knuckles specifically right MTP and left DIP, as well as her neck.  She has never had this sort of joint pain in the past.  Also admits to dysuria.  She is not sexually active.  Diarrhea has now resolved.  Assessment & Plan:   Principal Problem:   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[place]) Active Problems:   HLD (hyperlipidemia)   Coronary artery disease   Hypertension   Arthralgia   Complicated UTI (urinary tract infection)   Reactive arthritis -Concern for reactive arthritis in setting of recent illness/UTI, with extra-articular signs such as conjunctivitis/uveitis, diarrhea, GU infection.  CRP, sed rate remain elevated but rest of the rheumatological work-up was negative which includes rheumatoid factor, ANA, anti-CCP however HLA-B27 is pending.  I will also check RPR today.  Per patient, her pain is improving.  Continue current pain management.  PT recommends SNF.  TOC on board.  Potential plan for discharge tomorrow.  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 Gloriann Loan and now s/p left  ureteral stent removal.  -Urology consulted, patient does not have obstruction of left kidney at this point.  No current indication for intervention.  Recommended continued antibiotics.  Her urine culture is now growing VRE, antibiotics transition to Zyvox on 10/06/2020.  History of CLL -Followed by Dr. Lorenso Courier   Hypothyroidism -Continue Synthroid   Depression -Continue Buspar, effexor   DVT prophylaxis: enoxaparin (LOVENOX) injection 40 mg Start: 10/04/20 1200   Code Status: DNR  Family Communication:  None present at bedside.  Plan of care discussed with patient in length and he verbalized understanding and agreed with it.  Status is: Inpatient  Remains inpatient appropriate because:Inpatient level of care appropriate due to severity of illness  Dispo: The patient is from: Home              Anticipated d/c is to: SNF              Patient currently is medically stable to d/c.   Difficult to place patient No        Estimated body mass index is 28.16 kg/m as calculated from the following:   Height as of this encounter: 5' 3"  (1.6 m).   Weight as of this encounter: 72.1 kg.     Nutritional Assessment: Body mass index is 28.16 kg/m.Marland Kitchen Seen by dietician.  I agree with the assessment and plan as outlined below: Nutrition Status:        .  Skin Assessment: I have examined the patient's skin and I agree with the wound assessment as performed by the wound care RN as outlined below:  Consultants:  None  Procedures:  None  Antimicrobials:  Anti-infectives (From admission, onward)    Start     Dose/Rate Route Frequency Ordered Stop   10/06/20 1100  linezolid (ZYVOX) tablet 600 mg        600 mg Oral Every 12 hours 10/06/20 0955     10/05/20 2200  amoxicillin (AMOXIL) capsule 500 mg  Status:  Discontinued        500 mg Oral Every 8 hours 10/05/20 1220 10/06/20 0955   10/04/20 2200  cefTRIAXone (ROCEPHIN) 1 g in sodium chloride 0.9 % 100 mL IVPB  Status:   Discontinued        1 g 200 mL/hr over 30 Minutes Intravenous Every 24 hours 10/04/20 0654 10/05/20 1220   10/03/20 2330  cefTRIAXone (ROCEPHIN) 1 g in sodium chloride 0.9 % 100 mL IVPB        1 g 200 mL/hr over 30 Minutes Intravenous  Once 10/03/20 2318 10/04/20 0113          Subjective: Seen and examined.  Still complains of pain in the hands bilaterally and bilateral knee but improved compared to yesterday.  No new complaint.  Objective: Vitals:   10/06/20 0534 10/06/20 1343 10/06/20 2123 10/07/20 0508  BP: (!) 158/89 125/72 128/76 128/65  Pulse: 78 79 73 69  Resp: 18 16 14 14   Temp: 97.9 F (36.6 C) 98.2 F (36.8 C) 98.1 F (36.7 C) 97.8 F (36.6 C)  TempSrc: Oral Oral Oral Oral  SpO2: 98% 97% 98% 98%  Weight:      Height:        Intake/Output Summary (Last 24 hours) at 10/07/2020 1130 Last data filed at 10/07/2020 0513 Gross per 24 hour  Intake 120 ml  Output 1250 ml  Net -1130 ml    Filed Weights   10/03/20 1832  Weight: 72.1 kg    Examination:  General exam: Appears calm and comfortable  Respiratory system: Clear to auscultation. Respiratory effort normal. Cardiovascular system: S1 & S2 heard, RRR. No JVD, murmurs, rubs, gallops or clicks. No pedal edema. Gastrointestinal system: Abdomen is nondistended, soft and nontender. No organomegaly or masses felt. Normal bowel sounds heard. Central nervous system: Alert and oriented. No focal neurological deficits. Extremities: Symmetric 5 x 5 power. Skin: No rashes, lesions or ulcers.  Psychiatry: Judgement and insight appear normal. Mood & affect appropriate.    Data Reviewed: I have personally reviewed following labs and imaging studies  CBC: Recent Labs  Lab 10/03/20 1912 10/04/20 0703 10/05/20 0456 10/06/20 0538  WBC 62.2* 46.4* 34.0* 39.1*  NEUTROABS 1.8 1.4*  --   --   HGB 10.7* 9.2* 8.2* 9.0*  HCT 34.7* 30.1* 27.2* 29.3*  MCV 102.1* 102.7* 103.0* 103.9*  PLT 277 191 205 228    Basic  Metabolic Panel: Recent Labs  Lab 10/03/20 1912 10/04/20 0703 10/05/20 0456 10/06/20 0538  NA 136 139 139 141  K 5.4* 4.6 4.0 4.2  CL 104 108 107 106  CO2 22 24 24 26   GLUCOSE 118* 99 149* 103*  BUN 33* 30* 28* 24*  CREATININE 1.17* 1.10* 0.89 0.80  CALCIUM 9.1 8.5* 8.5* 8.7*    GFR: Estimated Creatinine Clearance: 53.4 mL/min (by C-G formula based on SCr of 0.8 mg/dL). Liver Function Tests: Recent Labs  Lab 10/03/20 1912 10/04/20 0703  AST 32 24  ALT 19 15  ALKPHOS 130* 110  BILITOT 0.4 0.4  PROT 6.2* 5.4*  ALBUMIN 3.4* 2.9*    No  results for input(s): LIPASE, AMYLASE in the last 168 hours. No results for input(s): AMMONIA in the last 168 hours. Coagulation Profile: No results for input(s): INR, PROTIME in the last 168 hours. Cardiac Enzymes: Recent Labs  Lab 10/04/20 0703  CKTOTAL 58    BNP (last 3 results) Recent Labs    07/15/20 1057  PROBNP 558    HbA1C: No results for input(s): HGBA1C in the last 72 hours. CBG: No results for input(s): GLUCAP in the last 168 hours. Lipid Profile: No results for input(s): CHOL, HDL, LDLCALC, TRIG, CHOLHDL, LDLDIRECT in the last 72 hours. Thyroid Function Tests: No results for input(s): TSH, T4TOTAL, FREET4, T3FREE, THYROIDAB in the last 72 hours.  Anemia Panel: No results for input(s): VITAMINB12, FOLATE, FERRITIN, TIBC, IRON, RETICCTPCT in the last 72 hours. Sepsis Labs: Recent Labs  Lab 10/03/20 2140  LATICACIDVEN 1.2     Recent Results (from the past 240 hour(s))  Blood culture (routine x 2)     Status: None (Preliminary result)   Collection Time: 10/03/20  8:20 PM   Specimen: BLOOD RIGHT ARM  Result Value Ref Range Status   Specimen Description   Final    BLOOD RIGHT ARM Performed at [facility], [place] [address], [place], [place] [address]    Special Requests   Final    BOTTLES DRAWN AEROBIC AND ANAEROBIC Blood Culture adequate volume Performed at [place]  [address], [place], [place] [address]    Culture   Final    NO GROWTH 2 DAYS Performed at [facility] [address] [place], [place] [address]    Report Status PENDING  Incomplete  Urine Culture     Status: Abnormal   Collection Time: 10/03/20 10:32 PM   Specimen: Urine, Clean Catch  Result Value Ref Range Status   Specimen Description   Final    URINE, CLEAN CATCH Performed at [facility], [place] [address], [place], [place] [address]    Special Requests   Final    NONE Performed at [facility], [place] [address], [place], [place] [address]    Culture (A)  Final    >=100,000 COLONIES/mL ENTEROCOCCUS FAECIUM VANCOMYCIN RESISTANT ENTEROCOCCUS    Report Status 10/06/2020 FINAL  Final   Organism ID, Bacteria ENTEROCOCCUS FAECIUM (A)  Final      Susceptibility   Enterococcus faecium - MIC*    AMPICILLIN >=32 RESISTANT Resistant     NITROFURANTOIN 64 INTERMEDIATE Intermediate     VANCOMYCIN >=32 RESISTANT Resistant     * >=100,000 COLONIES/mL ENTEROCOCCUS FAECIUM  Blood culture (routine x 2)     Status: None (Preliminary result)   Collection Time: 10/04/20  2:52 AM   Specimen: BLOOD  Result Value Ref Range Status   Specimen Description   Final    BLOOD BLOOD LEFT HAND Performed at [place] [facility] [address], [place], [place] [address]    Special Requests   Final    BOTTLES DRAWN AEROBIC AND ANAEROBIC Blood Culture adequate volume Performed at [place] [address], [place], [place] [address]    Culture   Final    NO GROWTH 2 DAYS Performed at [facility] [place] [address] [place],  [address]    Report Status PENDING  Incomplete  Resp Panel by RT-PCR (Flu A&B, Covid) Nasopharyngeal Swab     Status: None   Collection Time: 10/04/20  6:36 AM  Specimen: Nasopharyngeal Swab; Nasopharyngeal(NP) swabs in vial transport medium  Result Value Ref Range Status   SARS Coronavirus  2 by RT PCR NEGATIVE NEGATIVE Final    Comment: (NOTE) SARS-CoV-2 target nucleic acids are NOT DETECTED.  The SARS-CoV-2 RNA is generally detectable in upper respiratory specimens during the acute phase of infection. The lowest concentration of SARS-CoV-2 viral copies this assay can detect is 138 copies/mL. A negative result does not preclude SARS-Cov-2 infection and should not be used as the sole basis for treatment or other patient management decisions. A negative result may occur with  improper specimen collection/handling, submission of specimen other than nasopharyngeal swab, presence of viral mutation(s) within the areas targeted by this assay, and inadequate number of viral copies(<138 copies/mL). A negative result must be combined with clinical observations, patient history, and epidemiological information. The expected result is Negative.  Fact Sheet for Patients:  EntrepreneurPulse.com.au  Fact Sheet for Healthcare Providers:  IncredibleEmployment.be  This test is no t yet approved or cleared by the [place] FDA and  has been authorized for detection and/or diagnosis of SARS-CoV-2 by FDA under an Emergency Use Authorization (EUA). This EUA will remain  in effect (meaning this test can be used) for the duration of the COVID-19 declaration under Section 564(b)(1) of the Act, 21 U.S.C.section 360bbb-3(b)(1), unless the authorization is terminated  or revoked sooner.       Influenza A by PCR NEGATIVE NEGATIVE Final   Influenza B by PCR NEGATIVE NEGATIVE Final    Comment: (NOTE) The Xpert Xpress SARS-CoV-2/FLU/RSV plus assay is intended as an aid in the diagnosis of influenza from Nasopharyngeal swab specimens and should not be used as a sole basis for treatment. Nasal washings and aspirates are unacceptable for Xpert Xpress SARS-CoV-2/FLU/RSV testing.  Fact Sheet for Patients: EntrepreneurPulse.com.au  Fact  Sheet for Healthcare Providers: IncredibleEmployment.be  This test is not yet approved or cleared by the [place] FDA and has been authorized for detection and/or diagnosis of SARS-CoV-2 by FDA under an Emergency Use Authorization (EUA). This EUA will remain in effect (meaning this test can be used) for the duration of the COVID-19 declaration under Section 564(b)(1) of the Act, 21 U.S.C. section 360bbb-3(b)(1), unless the authorization is terminated or revoked.  Performed at [facility], [place] [address], [place], [place] [address]        Radiology Studies: No results found.  Scheduled Meds:  busPIRone  15 mg Oral BID   enoxaparin (LOVENOX) injection  40 mg Subcutaneous Q24H   ezetimibe  10 mg Oral Daily   levothyroxine  25 mcg Oral Daily   linezolid  600 mg Oral Q12H   pantoprazole  20 mg Oral Daily   polyethylene glycol  17 g Oral Daily   predniSONE  20 mg Oral Q breakfast   saccharomyces boulardii  250 mg Oral BID   venlafaxine XR  150 mg Oral Daily   Continuous Infusions:   LOS: 2 days   Time spent: 30 minutes   Darliss Cheney, MD [facility]  10/07/2020, 11:30 AM   How to contact the [facility] Attending or Consulting provider [place] or covering provider during after hours [place], for this patient?  Check the care team in [facility] and look for a) attending/consulting TRH provider listed and b) the [facility] team listed. Page or secure chat [phone]. Log into [email or web address] and use [place]'s universal password to access. If you do not have the password, please contact the hospital operator.  Locate the [facility] provider you are looking for under [facility] and page to a number that you can be directly reached. If you still have difficulty reaching the provider, please page the [facility] (Director on Call) for the Hospitalists listed on amion for assistance.

## 2020-10-08 LAB — RPR: RPR Ser Ql: NONREACTIVE

## 2020-10-08 LAB — SARS CORONAVIRUS 2 (TAT 6-24 HRS): SARS Coronavirus 2: NEGATIVE

## 2020-10-08 MED ORDER — LINEZOLID 600 MG PO TABS: 600.0000 mg | ORAL_TABLET | Freq: Two times a day (BID) | ORAL | 0 refills | Status: AC

## 2020-10-08 MED ORDER — HYDROCODONE-ACETAMINOPHEN 5-325 MG PO TABS: 1.0000 | ORAL_TABLET | Freq: Four times a day (QID) | ORAL | 0 refills | Status: DC | PRN

## 2020-10-08 MED ORDER — PREDNISONE 10 MG PO TABS: 10.0000 mg | ORAL_TABLET | Freq: Every day | ORAL | 0 refills | Status: AC

## 2020-10-08 NOTE — Care Management Important Message (Signed)
Important Message  Patient Details IM Letter given to the Patient. Name: Sarah [name] MRN: [id number] Date of Birth: Apr 10, 1940   Medicare Important Message Given:  Yes     Kerin Salen 10/08/2020, 10:05 AM

## 2020-10-08 NOTE — TOC Transition Note (Signed)
Transition of Care [facility]) - CM/SW Discharge Note   Patient Details  Name: Sarah [name] MRN: [id number] Date of Birth: November 12, 1940  Transition of Care [facility]) CM/SW Contact:  Lynnell Catalan, RN Phone Number: 10/08/2020, 12:38 PM   Clinical Narrative:    Pt to dc to [facility] today. [facility] contacted for transport. Yellow DNR on chart. RN to call report to [phone].    Final next level of care: Skilled Nursing Facility Barriers to Discharge: Continued Medical Work up   Patient Goals and CMS Choice Patient states their goals for this hospitalization and ongoing recovery are:: To get stronger      Discharge Placement              Patient chooses bed at: [facility] Patient to be transferred to facility by: [facility] Name of family member notified: Daughter [name] Patient and family notified of of transfer: 10/08/20  Discharge Plan and Services   Discharge Planning Services: CM Consult               Readmission Risk Interventions Readmission Risk Prevention Plan 10/06/2020 05/20/2020  Transportation Screening Complete Complete  PCP or Specialist Appt within 3-5 Days - Complete  HRI or [place] - Complete  Social Work Consult for Experiment Planning/Counseling - Complete  Palliative Care Screening - Not Applicable  Medication Review [employer] photographer) Complete Complete  PCP or Specialist appointment within 3-5 days of discharge Complete -  [place] or Home Care Consult Complete -  SW Recovery Care/Counseling Consult Complete -  Palliative Care Screening Not Applicable -  [place] Complete -

## 2020-10-08 NOTE — Discharge Summary (Signed)
Physician Discharge Summary  Sarah [name] IRW:[id number] DOB: Feb 05, 1941 DOA: 10/03/2020  PCP: Libby Maw, MD  Admit date: 10/03/2020 Discharge date: 10/08/2020 30 Day Unplanned Readmission Risk Score    Flowsheet Row ED to Hosp-Admission (Current) from 10/03/2020 in [facility]  30 Day Unplanned Readmission Risk Score (%) 31.33 Filed at 10/08/2020 0400       This score is the patient's risk of an unplanned readmission within 30 days of being discharged (0 -100%). The score is based on dignosis, age, lab data, medications, orders, and past utilization.   Low:  0-14.9   Medium: 15-21.9   High: 22-29.9   Extreme: 30 and above          Admitted From: Home Disposition: SNF  Recommendations for Outpatient Follow-up:  Follow up with PCP in 1-2 weeks Please obtain BMP/CBC in one week Follow-up with rheumatology if joint pain persists after 3 weeks Please follow up with your PCP on the following pending results: Unresulted Labs (From admission, onward)     Start     Ordered   10/07/20 1131  RPR  Once,   R       Question:  Specimen collection method  Answer:  Lab=Lab collect   10/07/20 1130   10/04/20 0746  HLA-B27 antigen  Once,   STAT        10/04/20 0745              Home Health: None Equipment/Devices: None  Discharge Condition: Stable CODE STATUS: DNR Diet recommendation: Cardiac/consistent carbohydrate  Subjective: Seen and examined.  No new complaints.  Pain in hand which is improving.  Brief/Interim Summary: Sarah [name] is a 80 y.o. female with history of CLL, breast cancer in remission, CAD, hypothyroidism, hyperlipidemia presented to the ER because of joint pains involving symmetrically the knee ankle wrist and hand.  She states that about 3 weeks ago, she had bilateral eye redness, irritation and saw an ophthalmologist.  She was diagnosed with viral syndrome, given oral doxycycline to ward off bacterial superinfection.  This caused  her to have diarrhea.  Antibiotic was decreased.  She then developed acute onset of diffuse joint pain.  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  This presented in bilateral feet, ankles, knees, wrists as well as knuckles specifically right MTP and left DIP, as well as her neck.  She has never had this sort of joint pain in the past.  Also admits to dysuria.  She is not sexually active.  Diarrhea has now resolved.  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 Gloriann Loan and now s/p left ureteral stent removal.  Urology was consulted however since she does not have any obstruction, they did not recommend any intervention.  They recommended continuing antibiotics for her UTI.  Extensive work-up was done for her arthritis which included rheumatoid factor, ANA, anti-CCP , RPR and HLA-B27 are still pending.  She was started on tapering dose of prednisone.  Over the course of last few days, her joint pain has improved.  It is only focused in the hands bilaterally now.  Eventually her urine culture grew VRE, her antibiotics were switched from IV Rocephin to Zyvox.  Patient was seen by PT OT who recommended SNF.  Finally a bed has been arranged for  her at the SNF and she is going to be discharged in stable condition.  She needs to take 5 more days of oral Zyvox.  I also prescribed her low-dose of prednisone 10 mg p.o. daily for 5 days.  Hopefully if this is reactive arthritis, the pain should improve in next 2 to 3 weeks.  If pain persists, I recommend she follows up with rheumatologist.    Discharge Diagnoses:  Principal Problem:   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[place]) Active Problems:   HLD (hyperlipidemia)   Coronary artery disease   Hypertension   Arthralgia   Complicated UTI (urinary tract  infection)    Discharge Instructions   Allergies as of 10/08/2020       Reactions   Amoxicillin    GI upset    Augmentin [amoxicillin-pot Clavulanate]    GI upset   Drug [tape]    Irritation    Sulfa Antibiotics    Childhood    Atenolol Rash   Macrodantin [nitrofurantoin] Rash        Medication List     TAKE these medications    busPIRone 15 MG tablet Commonly known as: BUSPAR Take 1 tablet (15 mg total) by mouth 2 (two) times daily.   EQ Multivitamins Adult Gummy Chew Chew 1 tablet by mouth in the morning. Unknown strength   ezetimibe 10 MG tablet Commonly known as: ZETIA Take 1 tablet (10 mg total) by mouth daily.   fluticasone 50 MCG/ACT nasal spray Commonly known as: FLONASE Place 2 sprays into both nostrils daily. What changed:  when to take this reasons to take this   guaifenesin 400 MG Tabs tablet Commonly known as: HUMIBID E Take 400 mg by mouth every 6 (six) hours as needed (allergies/ mucus).   HYDROcodone-acetaminophen 5-325 MG tablet Commonly known as: Norco Take 1 tablet by mouth every 6 (six) hours as needed for moderate pain.   ipratropium 0.03 % nasal spray Commonly known as: ATROVENT Place 2 sprays into both nostrils 2 (two) times daily.   lansoprazole 30 MG capsule Commonly known as: PREVACID TAKE 1 CAPSULE BY MOUTH EVERY DAY What changed:  how much to take when to take this   levothyroxine 50 MCG tablet Commonly known as: SYNTHROID TAKE 1 TABLET (50 MCG TOTAL) BY MOUTH DAILY. 30 MINUTES PRIOR TO EATING. What changed: how much to take   linezolid 600 MG tablet Commonly known as: ZYVOX Take 1 tablet (600 mg total) by mouth every 12 (twelve) hours for 5 days.   loratadine 10 MG tablet Commonly known as: CLARITIN Take 10 mg by mouth daily as needed for allergies.   potassium chloride 10 MEQ CR capsule Commonly known as: MICRO-K TAKE 1 CAPSULE BY MOUTH EVERY DAY What changed: how much to take   predniSONE 10 MG  tablet Commonly known as: DELTASONE Take 1 tablet (10 mg total) by mouth daily for 5 days.   saccharomyces boulardii 250 MG capsule Commonly known as: FLORASTOR Take 30 mg by mouth daily.   Systane 0.4-0.3 % Soln Generic drug: Polyethyl Glycol-Propyl Glycol Apply 1 drop to eye as needed. What changed: reasons to take this   trimethoprim 100 MG tablet Commonly known as: TRIMPEX Take 100 mg by mouth daily.   venlafaxine XR 150 MG 24 hr capsule Commonly known as: EFFEXOR-XR TAKE 1 CAPSULE BY MOUTH EVERY DAY FOR MOOD What changed: See the new instructions.        Follow-up Information     Libby Maw, MD Follow up in  1 week(s).   Specialty: Family Medicine Contact information: [place] [address] [phone]         Park Liter, MD .   Specialty: Cardiology Contact information: [address] [place] [address] [phone]                Allergies  Allergen Reactions   Amoxicillin     GI upset    Augmentin [Amoxicillin-Pot Clavulanate]     GI upset    Drug [Tape]     Irritation    Sulfa Antibiotics     Childhood    Atenolol Rash   Macrodantin [Nitrofurantoin] Rash    Consultations: Urology   Procedures/Studies: DG Chest Portable 1 View  Result Date: 10/03/2020 CLINICAL DATA:  Fevers EXAM: PORTABLE CHEST 1 VIEW COMPARISON:  07/15/2020 FINDINGS: Cardiac shadow is within normal limits. The lungs are clear bilaterally. Old rib fractures are noted on the left. No acute bony abnormality is seen. IMPRESSION: No acute abnormality noted. Electronically Signed   By: Inez Catalina M.D.   On: 10/03/2020 20:47   CT RENAL STONE STUDY  Result Date: 10/04/2020 CLINICAL DATA:  Fever, fatigue and flank pain EXAM: CT ABDOMEN AND PELVIS WITHOUT CONTRAST TECHNIQUE: Multidetector CT imaging of the abdomen and pelvis was performed following the standard protocol without IV contrast. COMPARISON:  05/08/2020 FINDINGS:  LOWER CHEST: Normal. HEPATOBILIARY: Normal hepatic contours. No intra- or extrahepatic biliary dilatation. Status post cholecystectomy. PANCREAS: Normal pancreas. No ductal dilatation or peripancreatic fluid collection. SPLEEN: Spleen is enlarged, measuring 13 cm in AP dimension. ADRENALS/URINARY TRACT: The adrenal glands are normal. 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 No right ureteral calculus or hydronephrosis. Unchanged left moderate hydronephrosis without hydroureter. No ureteral calculus. Urinary bladder is normal. STOMACH/BOWEL: There is no hiatal hernia. Normal duodenal course and caliber. No small bowel dilatation or inflammation. No focal colonic abnormality. The appendix is not visualized. No right lower quadrant inflammation or free fluid. VASCULAR/LYMPHATIC: There is calcific atherosclerosis of the abdominal aorta. Unchanged left para-aortic lymphadenopathy. Numerous small mesenteric lymph nodes also appear unchanged. REPRODUCTIVE: Status post hysterectomy. No adnexal mass. MUSCULOSKELETAL. No bony spinal canal stenosis or focal osseous abnormality. OTHER: None. IMPRESSION: 1. No acute abnormality of the abdomen or pelvis. 2. Unchanged left moderate hydronephrosis without hydroureter. 3. 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 4. Unchanged appearance of retroperitoneal and mesenteric lymphadenopathy. 5. Mild splenomegaly. Aortic Atherosclerosis (ICD10-I70.0). Electronically Signed   By: Ulyses Jarred M.D.   On: 10/04/2020 02:12     Discharge Exam: Vitals:   10/07/20 2034 10/08/20 0531  BP: 125/77 138/66  Pulse: 77 77  Resp: 14 14  Temp: 98.6 F (37 C) 98.3 F (36.8 C)  SpO2: 98% 98%   Vitals:   10/07/20 0508 10/07/20 1321 10/07/20 2034 10/08/20 0531  BP: 128/65 120/69 125/77 138/66  Pulse: 69 74 77 77  Resp: 14 14 14 14   Temp: 97.8 F (36.6 C) 98.6 F (37 C) 98.6 F (37 C) 98.3 F (36.8 C)  TempSrc: Oral Oral Oral Oral  SpO2: 98% 96% 98% 98%  Weight:       Height:        General: Pt is alert, awake, not in acute distress Cardiovascular: RRR, S1/S2 +, no rubs, no gallops Respiratory: CTA bilaterally, no wheezing, no rhonchi Abdominal: Soft, NT, ND, bowel sounds + Extremities: no edema, no cyanosis    The results of significant diagnostics from this hospitalization (including imaging,  microbiology, ancillary and laboratory) are listed below for reference.     Microbiology: Recent Results (from the past 240 hour(s))  Blood culture (routine x 2)     Status: None (Preliminary result)   Collection Time: 10/03/20  8:20 PM   Specimen: BLOOD RIGHT ARM  Result Value Ref Range Status   Specimen Description   Final    BLOOD RIGHT ARM Performed at [facility], [address] [place], [place] [address]    Special Requests   Final    BOTTLES DRAWN AEROBIC AND ANAEROBIC Blood Culture adequate volume Performed at [facility] [address], [place], [place] [address]    Culture   Final    NO GROWTH 3 DAYS Performed at [facility], [place] [address], [place], [place] [address]    Report Status PENDING  Incomplete  Urine Culture     Status: Abnormal   Collection Time: 10/03/20 10:32 PM   Specimen: Urine, Clean Catch  Result Value Ref Range Status   Specimen Description   Final    URINE, CLEAN CATCH Performed at [facility], [place] [address] [place], [place] [address]    Special Requests   Final    NONE Performed at [facility], [place] [address] [place], [place] [address]    Culture (A)  Final    >=100,000 COLONIES/mL ENTEROCOCCUS FAECIUM VANCOMYCIN RESISTANT ENTEROCOCCUS    Report Status 10/06/2020 FINAL  Final   Organism ID, Bacteria ENTEROCOCCUS FAECIUM (A)  Final      Susceptibility   Enterococcus faecium - MIC*    AMPICILLIN >=32 RESISTANT Resistant     NITROFURANTOIN 64 INTERMEDIATE Intermediate     VANCOMYCIN >=32 RESISTANT Resistant     * >=100,000  COLONIES/mL ENTEROCOCCUS FAECIUM  Blood culture (routine x 2)     Status: None (Preliminary result)   Collection Time: 10/04/20  2:52 AM   Specimen: BLOOD  Result Value Ref Range Status   Specimen Description   Final    BLOOD BLOOD LEFT HAND Performed at [facility] [address], [place], [place] [address]    Special Requests   Final    BOTTLES DRAWN AEROBIC AND ANAEROBIC Blood Culture adequate volume Performed at [facility] [address], [place], [place] [address]    Culture   Final    NO GROWTH 3 DAYS Performed at [facility], [place] [address] [place], [place] [address]    Report Status PENDING  Incomplete  Resp Panel by RT-PCR (Flu A&B, Covid) Nasopharyngeal Swab     Status: None   Collection Time: 10/04/20  6:36 AM   Specimen: Nasopharyngeal Swab; Nasopharyngeal(NP) swabs in vial transport medium  Result Value Ref Range Status   SARS Coronavirus 2 by RT PCR NEGATIVE NEGATIVE Final    Comment: (NOTE) SARS-CoV-2 target nucleic acids are NOT DETECTED.  The SARS-CoV-2 RNA is generally detectable in upper respiratory specimens during the acute phase of infection. The lowest concentration of SARS-CoV-2 viral copies this assay can detect is 138 copies/mL. A negative result does not preclude SARS-Cov-2 infection and should not be used as the sole basis for treatment or other patient management decisions. A negative result may occur with  improper specimen collection/handling, submission of specimen other than nasopharyngeal swab, presence of viral mutation(s) within the areas targeted by this assay, and inadequate number of viral copies(<138 copies/mL). A negative result must be combined with clinical observations, patient  history, and epidemiological information. The expected result is Negative.  Fact Sheet for Patients:  EntrepreneurPulse.com.au  Fact Sheet for Healthcare Providers:   IncredibleEmployment.be  This test is no t yet approved or cleared by the [place] FDA and  has been authorized for detection and/or diagnosis of SARS-CoV-2 by FDA under an Emergency Use Authorization (EUA). This EUA will remain  in effect (meaning this test can be used) for the duration of the COVID-19 declaration under Section 564(b)(1) of the Act, 21 U.S.C.section 360bbb-3(b)(1), unless the authorization is terminated  or revoked sooner.       Influenza A by PCR NEGATIVE NEGATIVE Final   Influenza B by PCR NEGATIVE NEGATIVE Final    Comment: (NOTE) The Xpert Xpress SARS-CoV-2/FLU/RSV plus assay is intended as an aid in the diagnosis of influenza from Nasopharyngeal swab specimens and should not be used as a sole basis for treatment. Nasal washings and aspirates are unacceptable for Xpert Xpress SARS-CoV-2/FLU/RSV testing.  Fact Sheet for Patients: EntrepreneurPulse.com.au  Fact Sheet for Healthcare Providers: IncredibleEmployment.be  This test is not yet approved or cleared by the [place] FDA and has been authorized for detection and/or diagnosis of SARS-CoV-2 by FDA under an Emergency Use Authorization (EUA). This EUA will remain in effect (meaning this test can be used) for the duration of the COVID-19 declaration under Section 564(b)(1) of the Act, 21 U.S.C. section 360bbb-3(b)(1), unless the authorization is terminated or revoked.  Performed at [facility], [place] [address] [place], [place] [address]   SARS CORONAVIRUS 2 (TAT 6-24 HRS) Nasopharyngeal Nasopharyngeal Swab     Status: None   Collection Time: 10/07/20  6:09 PM   Specimen: Nasopharyngeal Swab  Result Value Ref Range Status   SARS Coronavirus 2 NEGATIVE NEGATIVE Final    Comment: (NOTE) SARS-CoV-2 target nucleic acids are NOT DETECTED.  The SARS-CoV-2 RNA is generally detectable in upper and lower respiratory specimens  during the acute phase of infection. Negative results do not preclude SARS-CoV-2 infection, do not rule out co-infections with other pathogens, and should not be used as the sole basis for treatment or other patient management decisions. Negative results must be combined with clinical observations, patient history, and epidemiological information. The expected result is Negative.  Fact Sheet for Patients: SugarRoll.be  Fact Sheet for Healthcare Providers: https://www.woods-mathews.com/  This test is not yet approved or cleared by the [place] FDA and  has been authorized for detection and/or diagnosis of SARS-CoV-2 by FDA under an Emergency Use Authorization (EUA). This EUA will remain  in effect (meaning this test can be used) for the duration of the COVID-19 declaration under Se ction 564(b)(1) of the Act, 21 U.S.C. section 360bbb-3(b)(1), unless the authorization is terminated or revoked sooner.  Performed at [facility], [place] [address] [place], [place] [address]      Labs: BNP (last 3 results) Recent Labs    05/12/20 0224 05/13/20 0131 05/14/20 0044  BNP 80.0 163.3* 073.7*   Basic Metabolic Panel: Recent Labs  Lab 10/03/20 1912 10/04/20 0703 10/05/20 0456 10/06/20 0538  NA 136 139 139 141  K 5.4* 4.6 4.0 4.2  CL 104 108 107 106  CO2 22 24 24 26   GLUCOSE 118* 99 149* 103*  BUN 33* 30* 28* 24*  CREATININE 1.17* 1.10* 0.89 0.80  CALCIUM 9.1 8.5* 8.5* 8.7*   Liver Function Tests: Recent Labs  Lab 10/03/20 1912 10/04/20 0703  AST 32 24  ALT 19 15  ALKPHOS 130*  110  BILITOT 0.4 0.4  PROT 6.2* 5.4*  ALBUMIN 3.4* 2.9*   No results for input(s): LIPASE, AMYLASE in the last 168 hours. No results for input(s): AMMONIA in the last 168 hours. CBC: Recent Labs  Lab 10/03/20 1912 10/04/20 0703 10/05/20 0456 10/06/20 0538  WBC 62.2* 46.4* 34.0* 39.1*  NEUTROABS 1.8 1.4*  --   --   HGB 10.7* 9.2* 8.2*  9.0*  HCT 34.7* 30.1* 27.2* 29.3*  MCV 102.1* 102.7* 103.0* 103.9*  PLT 277 191 205 228   Cardiac Enzymes: Recent Labs  Lab 10/04/20 0703  CKTOTAL 58   BNP: Invalid input(s): POCBNP CBG: No results for input(s): GLUCAP in the last 168 hours. D-Dimer No results for input(s): DDIMER in the last 72 hours. Hgb A1c No results for input(s): HGBA1C in the last 72 hours. Lipid Profile No results for input(s): CHOL, HDL, LDLCALC, TRIG, CHOLHDL, LDLDIRECT in the last 72 hours. Thyroid function studies No results for input(s): TSH, T4TOTAL, T3FREE, THYROIDAB in the last 72 hours.  Invalid input(s): FREET3 Anemia work up No results for input(s): VITAMINB12, FOLATE, FERRITIN, TIBC, IRON, RETICCTPCT in the last 72 hours. Urinalysis    Component Value Date/Time   COLORURINE YELLOW 10/03/2020 2232   APPEARANCEUR HAZY (A) 10/03/2020 2232   LABSPEC 1.014 10/03/2020 2232   PHURINE 5.0 10/03/2020 2232   GLUCOSEU NEGATIVE 10/03/2020 2232   GLUCOSEU NEGATIVE 08/11/2020 1141   HGBUR NEGATIVE 10/03/2020 2232   BILIRUBINUR NEGATIVE 10/03/2020 2232   BILIRUBINUR neg 06/10/2020 1152   [place] 10/03/2020 2232   PROTEINUR 30 (A) 10/03/2020 2232   UROBILINOGEN 0.2 08/11/2020 1141   NITRITE NEGATIVE 10/03/2020 2232   LEUKOCYTESUR LARGE (A) 10/03/2020 2232   Sepsis Labs Invalid input(s): PROCALCITONIN,  WBC,  LACTICIDVEN Microbiology Recent Results (from the past 240 hour(s))  Blood culture (routine x 2)     Status: None (Preliminary result)   Collection Time: 10/03/20  8:20 PM   Specimen: BLOOD RIGHT ARM  Result Value Ref Range Status   Specimen Description   Final    BLOOD RIGHT ARM Performed at [facility], [place] [address] [place], [place] [address]    Special Requests   Final    BOTTLES DRAWN AEROBIC AND ANAEROBIC Blood Culture adequate volume Performed at [facility] [address] [place], [place] [address]    Culture   Final    NO GROWTH 3  DAYS Performed at [facility], [place] [address] [place], [place] [address]    Report Status PENDING  Incomplete  Urine Culture     Status: Abnormal   Collection Time: 10/03/20 10:32 PM   Specimen: Urine, Clean Catch  Result Value Ref Range Status   Specimen Description   Final    URINE, CLEAN CATCH Performed at [facility], [place] [address] [place], [place] [address]    Special Requests   Final    NONE Performed at [facility], [place] [address] [place], [place] [address]    Culture (A)  Final    >=100,000 COLONIES/mL ENTEROCOCCUS FAECIUM VANCOMYCIN RESISTANT ENTEROCOCCUS    Report Status 10/06/2020 FINAL  Final   Organism ID, Bacteria ENTEROCOCCUS FAECIUM (A)  Final      Susceptibility   Enterococcus faecium - MIC*    AMPICILLIN >=32 RESISTANT Resistant     NITROFURANTOIN 64 INTERMEDIATE Intermediate     VANCOMYCIN >=32 RESISTANT Resistant     * >=100,000 COLONIES/mL ENTEROCOCCUS FAECIUM  Blood culture (routine x 2)     Status: None (Preliminary result)   Collection Time: 10/04/20  2:52 AM   Specimen: BLOOD  Result Value Ref Range Status   Specimen Description   Final    BLOOD BLOOD LEFT HAND Performed at [facility] [address], [place], [place] [address]    Special Requests   Final    BOTTLES DRAWN AEROBIC AND ANAEROBIC Blood Culture adequate volume Performed at [facility] [address] [place], [place] [address]    Culture   Final    NO GROWTH 3 DAYS Performed at [facility], [place] [address], [place], [place] [address]    Report Status PENDING  Incomplete  Resp Panel by RT-PCR (Flu A&B, Covid) Nasopharyngeal Swab     Status: None   Collection Time: 10/04/20  6:36 AM   Specimen: Nasopharyngeal Swab; Nasopharyngeal(NP) swabs in vial transport medium  Result Value Ref Range Status   SARS Coronavirus 2 by RT PCR NEGATIVE NEGATIVE Final    Comment: (NOTE) SARS-CoV-2  target nucleic acids are NOT DETECTED.  The SARS-CoV-2 RNA is generally detectable in upper respiratory specimens during the acute phase of infection. The lowest concentration of SARS-CoV-2 viral copies this assay can detect is 138 copies/mL. A negative result does not preclude SARS-Cov-2 infection and should not be used as the sole basis for treatment or other patient management decisions. A negative result may occur with  improper specimen collection/handling, submission of specimen other than nasopharyngeal swab, presence of viral mutation(s) within the areas targeted by this assay, and inadequate number of viral copies(<138 copies/mL). A negative result must be combined with clinical observations, patient history, and epidemiological information. The expected result is Negative.  Fact Sheet for Patients:  EntrepreneurPulse.com.au  Fact Sheet for Healthcare Providers:  IncredibleEmployment.be  This test is no t yet approved or cleared by the [place] FDA and  has been authorized for detection and/or diagnosis of SARS-CoV-2 by FDA under an Emergency Use Authorization (EUA). This EUA will remain  in effect (meaning this test can be used) for the duration of the COVID-19 declaration under Section 564(b)(1) of the Act, 21 U.S.C.section 360bbb-3(b)(1), unless the authorization is terminated  or revoked sooner.       Influenza A by PCR NEGATIVE NEGATIVE Final   Influenza B by PCR NEGATIVE NEGATIVE Final    Comment: (NOTE) The Xpert Xpress SARS-CoV-2/FLU/RSV plus assay is intended as an aid in the diagnosis of influenza from Nasopharyngeal swab specimens and should not be used as a sole basis for treatment. Nasal washings and aspirates are unacceptable for Xpert Xpress SARS-CoV-2/FLU/RSV testing.  Fact Sheet for Patients: EntrepreneurPulse.com.au  Fact Sheet for Healthcare  Providers: IncredibleEmployment.be  This test is not yet approved or cleared by the [place] FDA and has been authorized for detection and/or diagnosis of SARS-CoV-2 by FDA under an Emergency Use Authorization (EUA). This EUA will remain in effect (meaning this test can be used) for the duration of the COVID-19 declaration under Section 564(b)(1) of the Act, 21 U.S.C. section 360bbb-3(b)(1), unless the authorization is terminated or revoked.  Performed at [facility], [place] [address] [place], [place] [address]   SARS CORONAVIRUS 2 (TAT 6-24 HRS) Nasopharyngeal Nasopharyngeal Swab     Status: None   Collection Time: 10/07/20  6:09 PM   Specimen: Nasopharyngeal Swab  Result Value Ref Range Status   SARS Coronavirus 2 NEGATIVE NEGATIVE Final    Comment: (  NOTE) SARS-CoV-2 target nucleic acids are NOT DETECTED.  The SARS-CoV-2 RNA is generally detectable in upper and lower respiratory specimens during the acute phase of infection. Negative results do not preclude SARS-CoV-2 infection, do not rule out co-infections with other pathogens, and should not be used as the sole basis for treatment or other patient management decisions. Negative results must be combined with clinical observations, patient history, and epidemiological information. The expected result is Negative.  Fact Sheet for Patients: SugarRoll.be  Fact Sheet for Healthcare Providers: [email or web address]  This test is not yet approved or cleared by the [place] FDA and  has been authorized for detection and/or diagnosis of SARS-CoV-2 by FDA under an Emergency Use Authorization (EUA). This EUA will remain  in effect (meaning this test can be used) for the duration of the COVID-19 declaration under Se ction 564(b)(1) of the Act, 21 U.S.C. section 360bbb-3(b)(1), unless the authorization is terminated or revoked  sooner.  Performed at [facility], [place] [address], [place], [place] [address]      Time coordinating discharge: Over 30 minutes  SIGNED:   Darliss Cheney, MD  [facility] 10/08/2020, 7:50 AM  If 7PM-7AM, please contact night-coverage [email or web address]

## 2020-10-09 LAB — CULTURE, BLOOD (ROUTINE X 2)
Culture: NO GROWTH
Culture: NO GROWTH
Special Requests: ADEQUATE
Special Requests: ADEQUATE

## 2020-10-11 LAB — HLA-B27 ANTIGEN: HLA-B27: NEGATIVE

## 2020-10-22 ENCOUNTER — Other Ambulatory Visit: Payer: Self-pay | Admitting: Family Medicine

## 2020-11-05 ENCOUNTER — Encounter: Payer: Self-pay | Admitting: Family Medicine

## 2020-11-05 ENCOUNTER — Other Ambulatory Visit: Payer: Self-pay

## 2020-11-05 ENCOUNTER — Ambulatory Visit (INDEPENDENT_AMBULATORY_CARE_PROVIDER_SITE_OTHER): Payer: Medicare Other | Admitting: Family Medicine

## 2020-11-05 VITALS — BP 102/64 | HR 92 | Temp 96.7°F | Ht 63.0 in | Wt 155.0 lb

## 2020-11-05 DIAGNOSIS — R5383 Other fatigue: Secondary | ICD-10-CM | POA: Diagnosis not present

## 2020-11-05 DIAGNOSIS — M2550 Pain in unspecified joint: Secondary | ICD-10-CM

## 2020-11-05 DIAGNOSIS — Z23 Encounter for immunization: Secondary | ICD-10-CM | POA: Diagnosis not present

## 2020-11-05 NOTE — Progress Notes (Signed)
Established Patient Office Visit  Subjective:  Patient ID: Sarah [name], female    DOB: 20-Apr-1940  Age: 80 y.o. MRN: [id number]  CC:  Chief Complaint  Patient presents with   Hospitalization [facility] follow up seen for UTI, per patient still having bladder pains when urinating.     HPI Heavyn [name] presents for hospital discharge follow-up, mid lower abdominal pain when urinating, fatigue and chronic joint aches and pains.  Hydrocodone helps her joint aches and pains she is taking it 1-2 times daily in addition to Tylenol.  She uses a walker constantly.  She feels some suprapubic discomfort when she urinates.  She denies dysuria frequency or urgency.  She is currently taking 5 days of Cipro.  Will return to trimethoprim for daily prophylaxis when she finishes Cipro.  Ongoing fatigue which is not surprising given her age and health history.  Past Medical History:  Diagnosis Date   Acute cystitis with hematuria 12/15/2019   AKI (acute kidney injury) ([place]) 05/06/2020   Allergic rhinitis due to pollen 12/08/2019   Allergy    Anxiety    Arthritis    CLL (chronic lymphocytic leukemia) (HCC)    Coronary artery disease 06/09/2020   Dyspnea    continuos   Elevated cholesterol 12/08/2019   Elevated TSH 06/10/2020   Fatigue 06/10/2020   GERD (gastroesophageal reflux disease)    Gout    History of gout 12/08/2019   History of heart attack    History of MI (myocardial infarction) 12/08/2019   History of myocardial infarction 12/08/2019   HLD (hyperlipidemia)    Hospital discharge follow-up 05/25/2020   Hypertension    Hypokalemia 05/25/2020   Hypotension 05/06/2020   Macrocytic anemia 12/15/2019   Non Hodgkin's lymphoma ([place])    Other general symptoms and signs  06/10/2020   Urinary frequency 12/08/2019   UTI (urinary tract infection) 05/06/2020    Past Surgical History:  Procedure Laterality Date   ABDOMINAL HYSTERECTOMY     APPENDECTOMY     CHOLECYSTECTOMY      CYSTOSCOPY WITH STENT PLACEMENT Left 05/12/2020   Procedure: CYSTOSCOPY WITH STENT PLACEMENT;  Surgeon: Lucas Mallow, MD;  Location: [place] [facility];  Service: Urology;  Laterality: Left;   CYSTOSCOPY/URETEROSCOPY/HOLMIUM LASER/STENT PLACEMENT Left 06/23/2020   Procedure: CYSTOSCOPY LEFT diagnostic /URETEROSCOPy /STENT  EXCHANGE ureteral BALLOON DILATION;  Surgeon: Lucas Mallow, MD;  Location: [facility];  Service: Urology;  Laterality: Left;  REQUESTING 1 HR   Double masectomy     KIDNEY SURGERY     MOHS SURGERY     TONSILECTOMY, ADENOIDECTOMY, BILATERAL MYRINGOTOMY AND TUBES      Family History  Problem Relation Age of Onset   Colon cancer Mother    Heart attack Father     Social History   Socioeconomic History   Marital status: Widowed    Spouse name: Not on file   Number of children: Not on file   Years of education: Not on file   Highest education level: Not on file  Occupational History   Not on file  Tobacco Use   Smoking status: Never   Smokeless tobacco: Never  Vaping Use   Vaping Use: Never used  Substance and Sexual Activity   Alcohol use: Not Currently    Comment: Rare social drinking   Drug use: Never   Sexual activity: Not Currently  Other Topics Concern   Not on file  Social History Narrative   Not on file  Social Determinants of Health   Financial Resource Strain: Low Risk    Difficulty of Paying Living Expenses: Not hard at all  Food Insecurity: No Food Insecurity   Worried About Charity fundraiser in the Last Year: Never true   [place] in the Last Year: Never true  Transportation Needs: No Transportation Needs   Lack of Transportation (Medical): No   Lack of Transportation (Non-Medical): No  Physical Activity: Inactive   Days of Exercise per Week: 0 days   Minutes of Exercise per Session: 0 min  Stress: No Stress Concern Present   Feeling of Stress : Not at all  Social Connections: Moderately Isolated   Frequency of Communication with  Friends and Family: More than three times a week   Frequency of Social Gatherings with Friends and Family: Once a week   Attends Religious Services: 1 to 4 times per year   Active Member of [employer] or Organizations: No   Attends Archivist Meetings: Never   Marital Status: Widowed  Human resources officer Violence: Not At Risk   Fear of Current or Ex-Partner: No   Emotionally Abused: No   Physically Abused: No   Sexually Abused: No    Outpatient Medications Prior to Visit  Medication Sig Dispense Refill   busPIRone (BUSPAR) 15 MG tablet Take 1 tablet (15 mg total) by mouth 2 (two) times daily. 180 tablet 1   ciprofloxacin (CIPRO) 500 MG tablet Take 500 mg by mouth 2 (two) times daily.     ezetimibe (ZETIA) 10 MG tablet Take 1 tablet (10 mg total) by mouth daily. 90 tablet 3   fluticasone (FLONASE) 50 MCG/ACT nasal spray Place 2 sprays into both nostrils daily. (Patient taking differently: Place 2 sprays into both nostrils as needed for allergies.) 16 g 6   guaifenesin (HUMIBID E) 400 MG TABS tablet Take 400 mg by mouth every 6 (six) hours as needed (allergies/ mucus).     HYDROcodone-acetaminophen (NORCO) 5-325 MG tablet Take 1 tablet by mouth every 6 (six) hours as needed for moderate pain. 20 tablet 0   ipratropium (ATROVENT) 0.03 % nasal spray Place 2 sprays into both nostrils 2 (two) times daily. 30 mL 0   lansoprazole (PREVACID) 30 MG capsule TAKE 1 CAPSULE BY MOUTH EVERY DAY 90 capsule 0   levothyroxine (SYNTHROID) 50 MCG tablet TAKE 1 TABLET (50 MCG TOTAL) BY MOUTH DAILY. 30 MINUTES PRIOR TO EATING. (Patient taking differently: Take 25 mcg by mouth daily. 30 minutes prior to eating.) 90 tablet 0   loratadine (CLARITIN) 10 MG tablet Take 10 mg by mouth daily as needed for allergies.     Multiple Vitamins-Minerals (EQ MULTIVITAMINS ADULT GUMMY) CHEW Chew 1 tablet by mouth in the morning. Unknown strength     Polyethyl Glycol-Propyl Glycol (SYSTANE) 0.4-0.3 % SOLN Apply 1 drop to eye  as needed. (Patient taking differently: Apply 1 drop to eye as needed (dryness).) 10 mL 0   potassium chloride (MICRO-K) 10 MEQ CR capsule TAKE 1 CAPSULE BY MOUTH EVERY DAY (Patient taking differently: Take 10 mEq by mouth daily.) 90 capsule 0   saccharomyces boulardii (FLORASTOR) 250 MG capsule Take 30 mg by mouth daily.     trimethoprim (TRIMPEX) 100 MG tablet Take 100 mg by mouth daily. (Patient not taking: Reported on 11/05/2020)     venlafaxine XR (EFFEXOR-XR) 150 MG 24 hr capsule TAKE 1 CAPSULE BY MOUTH EVERY DAY FOR MOOD (Patient taking differently: Take 150 mg by mouth daily  with breakfast.) 90 capsule 3   digoxin (LANOXIN) 0.125 MG tablet Take 125 mcg by mouth daily. (Patient not taking: Reported on 11/05/2020)     No facility-administered medications prior to visit.    Allergies  Allergen Reactions   Amoxicillin     GI upset    Augmentin [Amoxicillin-Pot Clavulanate]     GI upset    Drug [Tape]     Irritation    Sulfa Antibiotics     Childhood    Atenolol Rash   Macrodantin [Nitrofurantoin] Rash    ROS Review of Systems  Constitutional:  Positive for fatigue. Negative for diaphoresis, fever and unexpected weight change.  HENT:  Positive for congestion.   Respiratory: Negative.  Negative for chest tightness, shortness of breath and wheezing.   Cardiovascular:  Negative for chest pain and palpitations.  Gastrointestinal:  Positive for abdominal pain. Negative for nausea and vomiting.  Genitourinary:  Negative for dysuria, frequency and urgency.  Musculoskeletal:  Positive for arthralgias, gait problem and myalgias.  Neurological:  Negative for speech difficulty.  Psychiatric/Behavioral: Negative.       Objective:    Physical Exam Vitals and nursing note reviewed.  Constitutional:      General: She is not in acute distress.    Appearance: Normal appearance. She is ill-appearing. She is not toxic-appearing or diaphoretic.  HENT:     Head: Normocephalic and  atraumatic.     Left Ear: External ear normal.  Eyes:     General: No scleral icterus.       Right eye: No discharge.        Left eye: No discharge.     Extraocular Movements: Extraocular movements intact.     Conjunctiva/sclera: Conjunctivae normal.  Cardiovascular:     Rate and Rhythm: Normal rate and regular rhythm.  Pulmonary:     Effort: Pulmonary effort is normal. No respiratory distress.     Breath sounds: Normal breath sounds. No stridor. No wheezing, rhonchi or rales.  Abdominal:     General: Bowel sounds are normal. There is no distension.     Palpations: There is no mass.     Tenderness: There is no abdominal tenderness. There is no guarding or rebound.     Hernia: No hernia is present.  Musculoskeletal:     Cervical back: No rigidity.  Lymphadenopathy:     Cervical: No cervical adenopathy.  Skin:    General: Skin is warm and dry.  Neurological:     Mental Status: She is alert and oriented to person, place, and time.  Psychiatric:        Mood and Affect: Mood normal.        Behavior: Behavior normal.    BP 102/64 (BP Location: Left Arm, Patient Position: Sitting, Cuff Size: Normal)   Pulse 92   Temp (!) 96.7 F (35.9 C) (Temporal)   Ht 5' 3"  (1.6 m)   Wt 155 lb (70.3 kg)   SpO2 96%   BMI 27.46 kg/m  Wt Readings from Last 3 Encounters:  11/05/20 155 lb (70.3 kg)  10/03/20 158 lb 15.2 oz (72.1 kg)  09/29/20 159 lb (72.1 kg)     Health Maintenance Due  Topic Date Due   TETANUS/TDAP  Never done   Zoster Vaccines- Shingrix (1 of 2) Never done   DEXA SCAN  Never done    There are no preventive care reminders to display for this patient.  Lab Results  Component Value Date   TSH 3.915  10/04/2020   Lab Results  Component Value Date   WBC 39.1 (H) 10/06/2020   HGB 9.0 (L) 10/06/2020   HCT 29.3 (L) 10/06/2020   MCV 103.9 (H) 10/06/2020   PLT 228 10/06/2020   Lab Results  Component Value Date   NA 141 10/06/2020   K 4.2 10/06/2020   CO2 26  10/06/2020   GLUCOSE 103 (H) 10/06/2020   BUN 24 (H) 10/06/2020   CREATININE 0.80 10/06/2020   BILITOT 0.4 10/04/2020   ALKPHOS 110 10/04/2020   AST 24 10/04/2020   ALT 15 10/04/2020   PROT 5.4 (L) 10/04/2020   ALBUMIN 2.9 (L) 10/04/2020   CALCIUM 8.7 (L) 10/06/2020   ANIONGAP 9 10/06/2020   EGFR 42 (L) 09/29/2020   GFR 40.66 (L) 08/11/2020   No results found for: CHOL No results found for: HDL No results found for: LDLCALC No results found for: TRIG No results found for: CHOLHDL No results found for: HGBA1C    Assessment & Plan:   Problem List Items Addressed This Visit       Other   Fatigue   Arthralgia   Other Visit Diagnoses     Need for influenza vaccination    -  Primary   Relevant Orders   Flu Vaccine QUAD High Dose(Fluad) (Completed)       No orders of the defined types were placed in this encounter.   Follow-up: Return in about 2 weeks (around 11/19/2020).   Continue hydrocodone as needed for joint aches and pains.  Work-up has been negative for inflammatory arthropathy.  Elevated sed rate and CRP are consistent with her ongoing history of CLL.  Recheck urine in a couple of weeks.  Flu shot today.  Continue rest as needed, get plenty of sleep Libby Maw, MD

## 2020-11-06 ENCOUNTER — Encounter: Payer: Self-pay | Admitting: Family Medicine

## 2020-11-06 DIAGNOSIS — M2550 Pain in unspecified joint: Secondary | ICD-10-CM

## 2020-11-08 MED ORDER — HYDROCODONE-ACETAMINOPHEN 5-325 MG PO TABS: 1.0000 | ORAL_TABLET | Freq: Four times a day (QID) | ORAL | 0 refills | Status: DC | PRN

## 2020-11-08 NOTE — Addendum Note (Signed)
Addended by: Abelino Derrick A on: 11/08/2020 10:20 AM   Modules accepted: Orders

## 2020-11-08 NOTE — Telephone Encounter (Signed)
Patient requesting a refill on her Hydrocodone/Acet 5/325 mg LR 10/08/20, #20, 0 rf, by Dr Doristine Bosworth LOV 11/05/20 FOV  11/19/20  She would like to get a 30 day supply. Please review and advise.   Thanks.  Dm/cma

## 2020-11-12 ENCOUNTER — Telehealth: Payer: Self-pay

## 2020-11-12 NOTE — Telephone Encounter (Signed)
PA for Hydrocodone/Acet 5/325 mg submitted through cover my meds to [facility].  Awaiting response.  Dm/cma   (Key: [id number])

## 2020-11-12 NOTE — Telephone Encounter (Signed)
Have receive the PA and will do after lunch.  Dm/cma

## 2020-11-12 NOTE — Telephone Encounter (Signed)
PA submitted.  Awaiting response.  Dm/cma   (Key: [id number])

## 2020-11-13 NOTE — Telephone Encounter (Signed)
PA approved from 11/12/20-05/12/21. Pharmacy notified [place] phone.  Dm/cma

## 2020-11-14 ENCOUNTER — Encounter: Payer: Self-pay | Admitting: Family Medicine

## 2020-11-16 MED ORDER — EZETIMIBE 10 MG PO TABS: 10.0000 mg | ORAL_TABLET | Freq: Every day | ORAL | 3 refills | Status: DC

## 2020-11-18 ENCOUNTER — Other Ambulatory Visit: Payer: Self-pay

## 2020-11-19 ENCOUNTER — Ambulatory Visit: Payer: Medicare Other | Admitting: Family Medicine

## 2020-11-22 ENCOUNTER — Encounter: Payer: Self-pay | Admitting: Family Medicine

## 2020-11-22 ENCOUNTER — Ambulatory Visit (INDEPENDENT_AMBULATORY_CARE_PROVIDER_SITE_OTHER): Payer: Medicare Other | Admitting: Family Medicine

## 2020-11-22 ENCOUNTER — Other Ambulatory Visit: Payer: Self-pay

## 2020-11-22 VITALS — BP 110/70 | HR 73 | Temp 97.8°F | Ht 63.0 in | Wt 157.4 lb

## 2020-11-22 DIAGNOSIS — R399 Unspecified symptoms and signs involving the genitourinary system: Secondary | ICD-10-CM | POA: Diagnosis not present

## 2020-11-22 DIAGNOSIS — R7989 Other specified abnormal findings of blood chemistry: Secondary | ICD-10-CM

## 2020-11-22 DIAGNOSIS — M65331 Trigger finger, right middle finger: Secondary | ICD-10-CM

## 2020-11-22 DIAGNOSIS — E860 Dehydration: Secondary | ICD-10-CM | POA: Diagnosis not present

## 2020-11-22 DIAGNOSIS — Z853 Personal history of malignant neoplasm of breast: Secondary | ICD-10-CM

## 2020-11-22 LAB — POCT URINALYSIS DIPSTICK
Bilirubin, UA: NEGATIVE
Glucose, UA: NEGATIVE
Ketones, UA: NEGATIVE
Nitrite, UA: NEGATIVE
Protein, UA: POSITIVE — AB
Spec Grav, UA: 1.025 (ref 1.010–1.025)
Urobilinogen, UA: 1 E.U./dL
pH, UA: 6 (ref 5.0–8.0)

## 2020-11-22 NOTE — Progress Notes (Signed)
Established Patient Office Visit  Subjective:  Patient ID: Sarah [name], female    DOB: Feb 08, 1941  Age: 80 y.o. MRN: [id number]  CC:  Chief Complaint  Patient presents with   Urinary Tract Infection    Possible UTI C/O bladder pains/discomfort x 1 week, fever come and go x 4 days.     HPI Sarah [name] presents for evaluation of a weeklong history of discomfort with urination.  There is been no fever chills nausea or vomiting abdominal or back pain.  Denies vaginal discharge or vaginal itching.  She has a history of breast cancer status post bilateral mastectomy.  She tells me that her right middle finger gets stuck sometimes.  Point of care urinalysis showed a specific gravity of 25, leukocyte esterase and negative nitrites.  Past Medical History:  Diagnosis Date   Acute cystitis with hematuria 12/15/2019   AKI (acute kidney injury) ([place]) 05/06/2020   Allergic rhinitis due to pollen 12/08/2019   Allergy    Anxiety    Arthritis    CLL (chronic lymphocytic leukemia) (HCC)    Coronary artery disease 06/09/2020   Dyspnea    continuos   Elevated cholesterol 12/08/2019   Elevated TSH 06/10/2020   Fatigue 06/10/2020   GERD (gastroesophageal reflux disease)    Gout    History of gout 12/08/2019   History of heart attack    History of MI (myocardial infarction) 12/08/2019   History of myocardial infarction 12/08/2019   HLD (hyperlipidemia)    Hospital discharge follow-up 05/25/2020   Hypertension    Hypokalemia 05/25/2020   Hypotension 05/06/2020   Macrocytic anemia 12/15/2019   Non Hodgkin's lymphoma ([place])    Other general symptoms and signs  06/10/2020   Urinary frequency 12/08/2019   UTI (urinary tract infection) 05/06/2020    Past Surgical History:  Procedure Laterality Date   ABDOMINAL HYSTERECTOMY     APPENDECTOMY     CHOLECYSTECTOMY     CYSTOSCOPY WITH STENT PLACEMENT Left 05/12/2020   Procedure: CYSTOSCOPY WITH STENT PLACEMENT;  Surgeon: Lucas Mallow, MD;   Location: [facility];  Service: Urology;  Laterality: Left;   CYSTOSCOPY/URETEROSCOPY/HOLMIUM LASER/STENT PLACEMENT Left 06/23/2020   Procedure: CYSTOSCOPY LEFT diagnostic /URETEROSCOPy /STENT  EXCHANGE ureteral BALLOON DILATION;  Surgeon: Lucas Mallow, MD;  Location[facility];  Service: Urology;  Laterality: Left;  REQUESTING 1 HR   Double masectomy     KIDNEY SURGERY     MOHS SURGERY     TONSILECTOMY, ADENOIDECTOMY, BILATERAL MYRINGOTOMY AND TUBES      Family History  Problem Relation Age of Onset   Colon cancer Mother    Heart attack Father     Social History   Socioeconomic History   Marital status: Widowed    Spouse name: Not on file   Number of children: Not on file   Years of education: Not on file   Highest education level: Not on file  Occupational History   Not on file  Tobacco Use   Smoking status: Never   Smokeless tobacco: Never  Vaping Use   Vaping Use: Never used  Substance and Sexual Activity   Alcohol use: Not Currently    Comment: Rare social drinking   Drug use: Never   Sexual activity: Not Currently  Other Topics Concern   Not on file  Social History Narrative   Not on file   Social Determinants of Health   Financial Resource Strain: Low Risk    Difficulty of Paying  Living Expenses: Not hard at all  Food Insecurity: No Food Insecurity   Worried About [place] in the Last Year: Never true   Ran Out of Food in the Last Year: Never true  Transportation Needs: No Transportation Needs   Lack of Transportation (Medical): No   Lack of Transportation (Non-Medical): No  Physical Activity: Inactive   Days of Exercise per Week: 0 days   Minutes of Exercise per Session: 0 min  Stress: No Stress Concern Present   Feeling of Stress : Not at all  Social Connections: Moderately Isolated   Frequency of Communication with Friends and Family: More than three times a week   Frequency of Social Gatherings with Friends and Family: Once a week   Attends  Religious Services: 1 to 4 times per year   Active Member of [employer] or Organizations: No   Attends Archivist Meetings: Never   Marital Status: Widowed  Human resources officer Violence: Not At Risk   Fear of Current or Ex-Partner: No   Emotionally Abused: No   Physically Abused: No   Sexually Abused: No    Outpatient Medications Prior to Visit  Medication Sig Dispense Refill   busPIRone (BUSPAR) 15 MG tablet Take 1 tablet (15 mg total) by mouth 2 (two) times daily. 180 tablet 1   ezetimibe (ZETIA) 10 MG tablet Take 1 tablet (10 mg total) by mouth daily. 90 tablet 3   fluticasone (FLONASE) 50 MCG/ACT nasal spray Place 2 sprays into both nostrils daily. (Patient taking differently: Place 2 sprays into both nostrils as needed for allergies.) 16 g 6   guaifenesin (HUMIBID E) 400 MG TABS tablet Take 400 mg by mouth every 6 (six) hours as needed (allergies/ mucus).     HYDROcodone-acetaminophen (NORCO) 5-325 MG tablet Take 1 tablet by mouth every 6 (six) hours as needed for moderate pain. 60 tablet 0   ipratropium (ATROVENT) 0.03 % nasal spray Place 2 sprays into both nostrils 2 (two) times daily. 30 mL 0   lansoprazole (PREVACID) 30 MG capsule TAKE 1 CAPSULE BY MOUTH EVERY DAY 90 capsule 0   levothyroxine (SYNTHROID) 50 MCG tablet TAKE 1 TABLET (50 MCG TOTAL) BY MOUTH DAILY. 30 MINUTES PRIOR TO EATING. (Patient taking differently: Take 25 mcg by mouth daily. 30 minutes prior to eating.) 90 tablet 0   loratadine (CLARITIN) 10 MG tablet Take 10 mg by mouth daily as needed for allergies.     Multiple Vitamins-Minerals (EQ MULTIVITAMINS ADULT GUMMY) CHEW Chew 1 tablet by mouth in the morning. Unknown strength     Polyethyl Glycol-Propyl Glycol (SYSTANE) 0.4-0.3 % SOLN Apply 1 drop to eye as needed. (Patient taking differently: Apply 1 drop to eye as needed (dryness).) 10 mL 0   potassium chloride (MICRO-K) 10 MEQ CR capsule TAKE 1 CAPSULE BY MOUTH EVERY DAY (Patient taking differently: Take 10 mEq  by mouth daily.) 90 capsule 0   saccharomyces boulardii (FLORASTOR) 250 MG capsule Take 30 mg by mouth daily.     trimethoprim (TRIMPEX) 100 MG tablet Take 100 mg by mouth daily.     ciprofloxacin (CIPRO) 500 MG tablet Take 500 mg by mouth 2 (two) times daily. (Patient not taking: Reported on 11/22/2020)     venlafaxine XR (EFFEXOR-XR) 150 MG 24 hr capsule TAKE 1 CAPSULE BY MOUTH EVERY DAY FOR MOOD (Patient taking differently: Take 150 mg by mouth daily with breakfast.) 90 capsule 3   No facility-administered medications prior to visit.    Allergies  Allergen Reactions   Amoxicillin     GI upset    Augmentin [Amoxicillin-Pot Clavulanate]     GI upset    Drug [Tape]     Irritation    Sulfa Antibiotics     Childhood    Atenolol Rash   Macrodantin [Nitrofurantoin] Rash    ROS Review of Systems  Constitutional:  Negative for chills, diaphoresis, fatigue, fever and unexpected weight change.  HENT:  Positive for congestion and postnasal drip.   Eyes:  Negative for photophobia and visual disturbance.  Respiratory: Negative.    Cardiovascular: Negative.   Gastrointestinal:  Negative for abdominal pain.  Endocrine: Negative for polyphagia and polyuria.  Genitourinary:  Positive for dysuria. Negative for difficulty urinating, flank pain, urgency and vaginal discharge.  Musculoskeletal:  Positive for arthralgias.  Neurological:  Negative for speech difficulty and light-headedness.     Objective:    Physical Exam Vitals and nursing note reviewed.  Constitutional:      General: She is not in acute distress.    Appearance: Normal appearance. She is normal weight. She is not ill-appearing, toxic-appearing or diaphoretic.  HENT:     Head: Normocephalic and atraumatic.     Right Ear: External ear normal.     Left Ear: External ear normal.  Eyes:     General: No scleral icterus.       Right eye: No discharge.        Left eye: No discharge.     Conjunctiva/sclera: Conjunctivae  normal.  Cardiovascular:     Rate and Rhythm: Normal rate and regular rhythm.  Pulmonary:     Effort: Pulmonary effort is normal. No respiratory distress.     Breath sounds: Normal breath sounds. No stridor. No wheezing, rhonchi or rales.  Abdominal:     General: Bowel sounds are normal.     Tenderness: There is no abdominal tenderness. There is no right CVA tenderness or left CVA tenderness.  Skin:    General: Skin is warm and dry.  Neurological:     Mental Status: She is alert and oriented to person, place, and time.  Psychiatric:        Mood and Affect: Mood normal.        Behavior: Behavior normal.    BP 110/70 (BP Location: Left Arm, Patient Position: Sitting, Cuff Size: Large)   Pulse 73   Temp 97.8 F (36.6 C) (Temporal)   Ht 5' 3"  (1.6 m)   Wt 157 lb 6.4 oz (71.4 kg)   SpO2 98%   BMI 27.88 kg/m  Wt Readings from Last 3 Encounters:  11/22/20 157 lb 6.4 oz (71.4 kg)  11/05/20 155 lb (70.3 kg)  10/03/20 158 lb 15.2 oz (72.1 kg)     Health Maintenance Due  Topic Date Due   TETANUS/TDAP  Never done   Zoster Vaccines- Shingrix (1 of 2) Never done   DEXA SCAN  Never done    There are no preventive care reminders to display for this patient.  Lab Results  Component Value Date   TSH 3.915 10/04/2020   Lab Results  Component Value Date   WBC 39.1 (H) 10/06/2020   HGB 9.0 (L) 10/06/2020   HCT 29.3 (L) 10/06/2020   MCV 103.9 (H) 10/06/2020   PLT 228 10/06/2020   Lab Results  Component Value Date   NA 141 10/06/2020   K 4.2 10/06/2020   CO2 26 10/06/2020   GLUCOSE 103 (H) 10/06/2020   BUN 24 (H) 10/06/2020  CREATININE 0.80 10/06/2020   BILITOT 0.4 10/04/2020   ALKPHOS 110 10/04/2020   AST 24 10/04/2020   ALT 15 10/04/2020   PROT 5.4 (L) 10/04/2020   ALBUMIN 2.9 (L) 10/04/2020   CALCIUM 8.7 (L) 10/06/2020   ANIONGAP 9 10/06/2020   EGFR 42 (L) 09/29/2020   GFR 40.66 (L) 08/11/2020   No results found for: CHOL No results found for: HDL No results  found for: LDLCALC No results found for: TRIG No results found for: CHOLHDL No results found for: HGBA1C    Assessment & Plan:   Problem List Items Addressed This Visit       Musculoskeletal and Integument   Trigger middle finger of right hand     Other   Elevated TSH   UTI symptoms - Primary   Relevant Orders   POCT Urinalysis Dipstick (Completed)   Urine Culture   Dehydration   History of breast cancer    No orders of the defined types were placed in this encounter.   Follow-up: Return in about 3 months (around 02/22/2021), or if symptoms worsen or fail to improve.  Will use Voltaren cream as needed for sore spot on right middle finger.  Discussed sports medicine referral.  Recheck on TSH is normal.  Stressed the importance of staying hydrated, realizing it is difficult for her to go back and forth to the bathroom.  Reluctant to try estrogen cream with her history of breast cancer.  Information was given on dehydration.  They understand I will have to wait on culture results.  Libby Maw, MD

## 2020-11-22 NOTE — Progress Notes (Deleted)
error 

## 2020-11-23 LAB — URINE CULTURE
MICRO NUMBER:: 12452243
SPECIMEN QUALITY:: ADEQUATE

## 2020-11-25 ENCOUNTER — Ambulatory Visit: Payer: Medicare Other | Admitting: Family Medicine

## 2020-11-26 ENCOUNTER — Other Ambulatory Visit: Payer: Self-pay | Admitting: Family Medicine

## 2020-11-26 DIAGNOSIS — R7989 Other specified abnormal findings of blood chemistry: Secondary | ICD-10-CM

## 2020-11-26 DIAGNOSIS — R5383 Other fatigue: Secondary | ICD-10-CM

## 2020-11-26 DIAGNOSIS — E039 Hypothyroidism, unspecified: Secondary | ICD-10-CM

## 2020-11-26 MED ORDER — LEVOTHYROXINE SODIUM 50 MCG PO TABS
50.0000 ug | ORAL_TABLET | Freq: Every day | ORAL | 0 refills | Status: DC
Start: 2020-11-26 — End: 2021-04-12

## 2020-12-06 ENCOUNTER — Ambulatory Visit: Payer: Medicare Other | Admitting: Family Medicine

## 2020-12-08 ENCOUNTER — Other Ambulatory Visit: Payer: Self-pay | Admitting: Family Medicine

## 2020-12-08 DIAGNOSIS — M2550 Pain in unspecified joint: Secondary | ICD-10-CM

## 2020-12-09 ENCOUNTER — Telehealth: Payer: Self-pay | Admitting: Family Medicine

## 2020-12-09 MED ORDER — HYDROCODONE-ACETAMINOPHEN 5-325 MG PO TABS: 1.0000 | ORAL_TABLET | Freq: Four times a day (QID) | ORAL | 0 refills | Status: DC | PRN

## 2020-12-09 NOTE — Telephone Encounter (Signed)
Spoke with patients daughter who verbally understood message below per daughter they will give [place] a call if anything changes.

## 2020-12-09 NOTE — Telephone Encounter (Signed)
Please advise message below  °

## 2020-12-21 ENCOUNTER — Other Ambulatory Visit: Payer: Self-pay | Admitting: Family Medicine

## 2020-12-26 ENCOUNTER — Other Ambulatory Visit: Payer: Self-pay | Admitting: Family Medicine

## 2020-12-28 NOTE — Telephone Encounter (Signed)
Called and spoke to daughter [name], confirmed trimethoprim was not prescribed by pcp, daugter confirm pharmcay sent request to wrong office.

## 2021-01-07 ENCOUNTER — Other Ambulatory Visit: Payer: Self-pay | Admitting: Family Medicine

## 2021-01-07 DIAGNOSIS — M2550 Pain in unspecified joint: Secondary | ICD-10-CM

## 2021-01-10 MED ORDER — HYDROCODONE-ACETAMINOPHEN 5-325 MG PO TABS: 1.0000 | ORAL_TABLET | Freq: Four times a day (QID) | ORAL | 0 refills | Status: DC | PRN

## 2021-02-08 ENCOUNTER — Other Ambulatory Visit: Payer: Self-pay | Admitting: Family Medicine

## 2021-02-08 DIAGNOSIS — M2550 Pain in unspecified joint: Secondary | ICD-10-CM

## 2021-02-09 MED ORDER — HYDROCODONE-ACETAMINOPHEN 5-325 MG PO TABS: 1.0000 | ORAL_TABLET | Freq: Four times a day (QID) | ORAL | 0 refills | Status: AC | PRN

## 2021-02-09 NOTE — Telephone Encounter (Signed)
Refill request for: Hydrocodone 5/325 mg  LR 01/10/21, #60, 0 rf LOV 11/22/20 FOV 02/22/21  Please review and advise.  Thanks. Dm/cma

## 2021-02-18 ENCOUNTER — Telehealth: Payer: Self-pay | Admitting: Family Medicine

## 2021-02-18 NOTE — Telephone Encounter (Signed)
Nikki calling from [employer] wanting a verbal order for pt stating-in home hospice care for pt, her daughter has agreed with this. She has a reactivation of CLL and a white blood count of 55.2. Please contact Nikki at [phone] and you can leave a detailed message on the vm.

## 2021-02-18 NOTE — Telephone Encounter (Signed)
Spoke to [place], advised that Dr Bebe Shaggy agrees with plan.  Dm/cma

## 2021-02-22 ENCOUNTER — Other Ambulatory Visit: Payer: Self-pay

## 2021-02-22 ENCOUNTER — Ambulatory Visit (INDEPENDENT_AMBULATORY_CARE_PROVIDER_SITE_OTHER): Payer: Medicare PPO | Admitting: Family Medicine

## 2021-02-22 ENCOUNTER — Telehealth: Payer: Self-pay | Admitting: *Deleted

## 2021-02-22 ENCOUNTER — Encounter: Payer: Self-pay | Admitting: Family Medicine

## 2021-02-22 VITALS — BP 92/64 | HR 96 | Temp 97.6°F | Ht 63.0 in | Wt 152.6 lb

## 2021-02-22 DIAGNOSIS — I872 Venous insufficiency (chronic) (peripheral): Secondary | ICD-10-CM | POA: Diagnosis not present

## 2021-02-22 DIAGNOSIS — I959 Hypotension, unspecified: Secondary | ICD-10-CM | POA: Diagnosis not present

## 2021-02-22 NOTE — Progress Notes (Signed)
Established Patient Office Visit  Subjective:  Patient ID: Sarah [name], female    DOB: [birth year]  Age: 81 y.o. MRN: [id number]  CC:  Chief Complaint  Patient presents with   Follow-up    3 month follow up would like to discuss most recent diagnosis.     HPI Sarah [name] presents for follow-up and discussion of her decision to enter hospice care.  She notices fatigue and weakness.  It is difficult for her to do much she says.  Feels as though she is thinking clearly and alert.  No specific pain at this point.  She is accompanied by her daughter.  Past Medical History:  Diagnosis Date   Acute cystitis with hematuria 12/15/2019   AKI (acute kidney injury) ([place]) 05/06/2020   Allergic rhinitis due to pollen 12/08/2019   Allergy    Anxiety    Arthritis    CLL (chronic lymphocytic leukemia) (HCC)    Coronary artery disease 06/09/2020   Dyspnea    continuos   Elevated cholesterol 12/08/2019   Elevated TSH 06/10/2020   Fatigue 06/10/2020   GERD (gastroesophageal reflux disease)    Gout    History of gout 12/08/2019   History of heart attack    History of MI (myocardial infarction) 12/08/2019   History of myocardial infarction 12/08/2019   HLD (hyperlipidemia)    Hospital discharge follow-up 05/25/2020   Hypertension    Hypokalemia 05/25/2020   Hypotension 05/06/2020   Macrocytic anemia 12/15/2019   Non Hodgkin's lymphoma ([place])    Other general symptoms and signs  06/10/2020   Urinary frequency 12/08/2019   UTI (urinary tract infection) 05/06/2020    Past Surgical History:  Procedure Laterality Date   ABDOMINAL HYSTERECTOMY     APPENDECTOMY     CHOLECYSTECTOMY     CYSTOSCOPY WITH STENT PLACEMENT Left 05/12/2020   Procedure: CYSTOSCOPY WITH STENT PLACEMENT;  Surgeon: Lucas Mallow, MD;  Location: [facility];  Service: Urology;  Laterality: Left;   CYSTOSCOPY/URETEROSCOPY/HOLMIUM LASER/STENT PLACEMENT Left 06/23/2020   Procedure: CYSTOSCOPY LEFT diagnostic /URETEROSCOPy /STENT   EXCHANGE ureteral BALLOON DILATION;  Surgeon: Lucas Mallow, MD;  Location: [facility];  Service: Urology;  Laterality: Left;  REQUESTING 1 HR   Double masectomy     KIDNEY SURGERY     MOHS SURGERY     TONSILECTOMY, ADENOIDECTOMY, BILATERAL MYRINGOTOMY AND TUBES      Family History  Problem Relation Age of Onset   Colon cancer Mother    Heart attack Father     Social History   Socioeconomic History   Marital status: Widowed    Spouse name: Not on file   Number of children: Not on file   Years of education: Not on file   Highest education level: Not on file  Occupational History   Not on file  Tobacco Use   Smoking status: Never   Smokeless tobacco: Never  Vaping Use   Vaping Use: Never used  Substance and Sexual Activity   Alcohol use: Not Currently    Comment: Rare social drinking   Drug use: Never   Sexual activity: Not Currently  Other Topics Concern   Not on file  Social History Narrative   Not on file   Social Determinants of Health   Financial Resource Strain: Not on file  Food Insecurity: Not on file  Transportation Needs: Not on file  Physical Activity: Not on file  Stress: Not on file  Social Connections: Not on file  Intimate Partner Violence: Not on file    Outpatient Medications Prior to Visit  Medication Sig Dispense Refill   busPIRone (BUSPAR) 15 MG tablet Take 1 tablet (15 mg total) by mouth 2 (two) times daily. 180 tablet 1   fluticasone (FLONASE) 50 MCG/ACT nasal spray Place 2 sprays into both nostrils daily. (Patient taking differently: Place 2 sprays into both nostrils as needed for allergies.) 16 g 6   guaifenesin (HUMIBID E) 400 MG TABS tablet Take 400 mg by mouth every 6 (six) hours as needed (allergies/ mucus).     HYDROcodone-acetaminophen (NORCO) 5-325 MG tablet Take 1 tablet by mouth every 6 (six) hours as needed for moderate pain. 60 tablet 0   ipratropium (ATROVENT) 0.03 % nasal spray Place 2 sprays into both nostrils 2 (two) times  daily. 30 mL 0   lansoprazole (PREVACID) 30 MG capsule TAKE 1 CAPSULE BY MOUTH EVERY DAY 30 capsule 14   levothyroxine (SYNTHROID) 50 MCG tablet Take 1 tablet (50 mcg total) by mouth daily. 30 minutes prior to eating. 90 tablet 0   loratadine (CLARITIN) 10 MG tablet Take 10 mg by mouth daily as needed for allergies.     Multiple Vitamins-Minerals (EQ MULTIVITAMINS ADULT GUMMY) CHEW Chew 1 tablet by mouth in the morning. Unknown strength     Polyethyl Glycol-Propyl Glycol (SYSTANE) 0.4-0.3 % SOLN Apply 1 drop to eye as needed. (Patient taking differently: Apply 1 drop to eye as needed (dryness).) 10 mL 0   potassium chloride (MICRO-K) 10 MEQ CR capsule TAKE 1 CAPSULE BY MOUTH EVERY DAY (Patient taking differently: Take 10 mEq by mouth daily.) 90 capsule 0   saccharomyces boulardii (FLORASTOR) 250 MG capsule Take 30 mg by mouth daily.     trimethoprim (TRIMPEX) 100 MG tablet Take 100 mg by mouth daily.     venlafaxine XR (EFFEXOR-XR) 150 MG 24 hr capsule TAKE 1 CAPSULE BY MOUTH EVERY DAY FOR MOOD (Patient taking differently: Take 150 mg by mouth daily with breakfast.) 90 capsule 3   ezetimibe (ZETIA) 10 MG tablet Take 1 tablet (10 mg total) by mouth daily. 90 tablet 3   ciprofloxacin (CIPRO) 500 MG tablet Take 500 mg by mouth 2 (two) times daily. (Patient not taking: Reported on 11/22/2020)     No facility-administered medications prior to visit.    Allergies  Allergen Reactions   Amoxicillin     GI upset    Augmentin [Amoxicillin-Pot Clavulanate]     GI upset    Drug [Tape]     Irritation    Sulfa Antibiotics     Childhood    Atenolol Rash   Macrodantin [Nitrofurantoin] Rash    ROS Review of Systems  Constitutional:  Positive for fatigue. Negative for chills, diaphoresis, fever and unexpected weight change.  HENT: Negative.    Respiratory: Negative.    Cardiovascular: Negative.   Gastrointestinal: Negative.   Genitourinary: Negative.   Musculoskeletal:  Positive for gait problem.   Neurological:  Positive for weakness. Negative for speech difficulty.  Psychiatric/Behavioral:  Negative for agitation and confusion.      Objective:    Physical Exam Vitals and nursing note reviewed.  Constitutional:      General: She is not in acute distress.    Appearance: She is ill-appearing. She is not toxic-appearing or diaphoretic.  HENT:     Head: Normocephalic and atraumatic.     Right Ear: External ear normal.     Left Ear: External ear normal.  Eyes:  General: No scleral icterus.       Right eye: No discharge.        Left eye: No discharge.     Extraocular Movements: Extraocular movements intact.     Conjunctiva/sclera: Conjunctivae normal.  Cardiovascular:     Rate and Rhythm: Normal rate and regular rhythm.  Pulmonary:     Effort: Pulmonary effort is normal.     Breath sounds: Normal breath sounds.  Musculoskeletal:        General: Swelling (swelling in legs without edema.) present.  Skin:    General: Skin is warm and dry.  Neurological:     Mental Status: She is alert and oriented to person, place, and time.  Psychiatric:        Mood and Affect: Mood normal.        Behavior: Behavior normal.    BP 92/64 (BP Location: Left Arm, Patient Position: Sitting, Cuff Size: Large)    Pulse 96    Temp 97.6 F (36.4 C) (Temporal)    Ht 5' 3"  (1.6 m)    Wt 152 lb 9.6 oz (69.2 kg)    SpO2 96%    BMI 27.03 kg/m  Wt Readings from Last 3 Encounters:  02/22/21 152 lb 9.6 oz (69.2 kg)  11/22/20 157 lb 6.4 oz (71.4 kg)  11/05/20 155 lb (70.3 kg)     Health Maintenance Due  Topic Date Due   Pneumonia Vaccine 25+ Years old (1 - PCV) Never done   TETANUS/TDAP  Never done   Zoster Vaccines- Shingrix (1 of 2) Never done   DEXA SCAN  Never done    There are no preventive care reminders to display for this patient.  Lab Results  Component Value Date   TSH 3.915 10/04/2020   Lab Results  Component Value Date   WBC 39.1 (H) 10/06/2020   HGB 9.0 (L) 10/06/2020    HCT 29.3 (L) 10/06/2020   MCV 103.9 (H) 10/06/2020   PLT 228 10/06/2020   Lab Results  Component Value Date   NA 141 10/06/2020   K 4.2 10/06/2020   CO2 26 10/06/2020   GLUCOSE 103 (H) 10/06/2020   BUN 24 (H) 10/06/2020   CREATININE 0.80 10/06/2020   BILITOT 0.4 10/04/2020   ALKPHOS 110 10/04/2020   AST 24 10/04/2020   ALT 15 10/04/2020   PROT 5.4 (L) 10/04/2020   ALBUMIN 2.9 (L) 10/04/2020   CALCIUM 8.7 (L) 10/06/2020   ANIONGAP 9 10/06/2020   EGFR 42 (L) 09/29/2020   GFR 40.66 (L) 08/11/2020   No results found for: CHOL No results found for: HDL No results found for: LDLCALC No results found for: TRIG No results found for: CHOLHDL No results found for: HGBA1C    Assessment & Plan:   Problem List Items Addressed This Visit       Cardiovascular and Mediastinum   Hypotension - Primary   Venous incompetence    No orders of the defined types were placed in this encounter.   Follow-up: Return if symptoms worsen or fail to improve.  Agree with hospices direction that further blood draws and follow-up health checks are strictly on an as-needed basis.  Suggested that future visits with me could be by Zoom for convenience sake.  Libby Maw, MD

## 2021-02-22 NOTE — Telephone Encounter (Signed)
Received a voicemail from patient's daughter Mardene Sayer requesting a copy of patient's most recent lab results and wanted to make sure that hospice also had a copy.  Contacted our palliative care supervisor, Tomasita Crumble and she advised that she already sent a copy of these results to Dr. Bebe Shaggy office today. She also states that our hospice already has access to these results. She also explained the process of how her daughter can get a copy of these results if she wants them for her records.  I called and left a voicemail with patient's daughter with the above noted information included.

## 2021-02-23 ENCOUNTER — Other Ambulatory Visit: Payer: Self-pay | Admitting: Family Medicine

## 2021-02-25 ENCOUNTER — Other Ambulatory Visit: Payer: Medicare Other

## 2021-02-25 ENCOUNTER — Ambulatory Visit: Payer: Medicare Other | Admitting: Hematology and Oncology

## 2021-03-07 ENCOUNTER — Ambulatory Visit: Payer: Medicare Other | Admitting: Cardiology

## 2021-03-10 ENCOUNTER — Other Ambulatory Visit: Payer: Self-pay | Admitting: *Deleted

## 2021-03-10 DIAGNOSIS — C9110 Chronic lymphocytic leukemia of B-cell type not having achieved remission: Secondary | ICD-10-CM

## 2021-03-11 ENCOUNTER — Telehealth: Payer: Self-pay | Admitting: Hematology and Oncology

## 2021-03-11 ENCOUNTER — Inpatient Hospital Stay: Payer: Medicare PPO | Admitting: Physician Assistant

## 2021-03-11 ENCOUNTER — Inpatient Hospital Stay: Payer: Medicare PPO | Admitting: Hematology and Oncology

## 2021-03-11 ENCOUNTER — Telehealth: Payer: Self-pay | Admitting: *Deleted

## 2021-03-11 ENCOUNTER — Inpatient Hospital Stay: Payer: Medicare PPO | Attending: Physician Assistant

## 2021-03-11 ENCOUNTER — Other Ambulatory Visit: Payer: Self-pay

## 2021-03-11 DIAGNOSIS — C9110 Chronic lymphocytic leukemia of B-cell type not having achieved remission: Secondary | ICD-10-CM | POA: Insufficient documentation

## 2021-03-11 DIAGNOSIS — Z79899 Other long term (current) drug therapy: Secondary | ICD-10-CM | POA: Insufficient documentation

## 2021-03-11 DIAGNOSIS — Z853 Personal history of malignant neoplasm of breast: Secondary | ICD-10-CM | POA: Insufficient documentation

## 2021-03-11 DIAGNOSIS — Z170 Estrogen receptor positive status [ER+]: Secondary | ICD-10-CM | POA: Insufficient documentation

## 2021-03-11 LAB — CBC WITH DIFFERENTIAL (CANCER CENTER ONLY)
Abs Immature Granulocytes: 0.07 10*3/uL (ref 0.00–0.07)
Basophils Absolute: 0 10*3/uL (ref 0.0–0.1)
Basophils Relative: 0 %
Eosinophils Absolute: 0.4 10*3/uL (ref 0.0–0.5)
Eosinophils Relative: 1 %
HCT: 32.6 % — ABNORMAL LOW (ref 36.0–46.0)
Hemoglobin: 9.7 g/dL — ABNORMAL LOW (ref 12.0–15.0)
Immature Granulocytes: 0 %
Lymphocytes Relative: 96 %
Lymphs Abs: 62.5 10*3/uL — ABNORMAL HIGH (ref 0.7–4.0)
MCH: 30 pg (ref 26.0–34.0)
MCHC: 29.8 g/dL — ABNORMAL LOW (ref 30.0–36.0)
MCV: 100.9 fL — ABNORMAL HIGH (ref 80.0–100.0)
Monocytes Absolute: 0.6 10*3/uL (ref 0.1–1.0)
Monocytes Relative: 1 %
Neutro Abs: 1.5 10*3/uL — ABNORMAL LOW (ref 1.7–7.7)
Neutrophils Relative %: 2 %
Platelet Count: 229 10*3/uL (ref 150–400)
RBC: 3.23 MIL/uL — ABNORMAL LOW (ref 3.87–5.11)
RDW: 19.7 % — ABNORMAL HIGH (ref 11.5–15.5)
Smear Review: NORMAL
WBC Count: 65.1 10*3/uL (ref 4.0–10.5)
nRBC: 0 % (ref 0.0–0.2)

## 2021-03-11 LAB — CMP (CANCER CENTER ONLY)
ALT: 11 U/L (ref 0–44)
AST: 19 U/L (ref 15–41)
Albumin: 3.5 g/dL (ref 3.5–5.0)
Alkaline Phosphatase: 127 U/L — ABNORMAL HIGH (ref 38–126)
Anion gap: 11 (ref 5–15)
BUN: 27 mg/dL — ABNORMAL HIGH (ref 8–23)
CO2: 20 mmol/L — ABNORMAL LOW (ref 22–32)
Calcium: 8.7 mg/dL — ABNORMAL LOW (ref 8.9–10.3)
Chloride: 108 mmol/L (ref 98–111)
Creatinine: 1.27 mg/dL — ABNORMAL HIGH (ref 0.44–1.00)
GFR, Estimated: 42 mL/min — ABNORMAL LOW (ref >60)
Glucose, Bld: 132 mg/dL — ABNORMAL HIGH (ref 70–99)
Potassium: 4.4 mmol/L (ref 3.5–5.1)
Sodium: 139 mmol/L (ref 135–145)
Total Bilirubin: 0.5 mg/dL (ref 0.3–1.2)
Total Protein: 5.6 g/dL — ABNORMAL LOW (ref 6.5–8.1)

## 2021-03-11 LAB — LACTATE DEHYDROGENASE: LDH: 228 U/L — ABNORMAL HIGH (ref 98–192)

## 2021-03-11 NOTE — Telephone Encounter (Signed)
Met with pt in exam room 12. Advised that Dr. Lorenso Courier had to leave unexpectedly.  Advised that I can re-schedule her appt for next week. Pt and daughter are agreeable to this. Nursing assessment done. Major concerns were decreased energy and decreased strength. Also states she has increased night sweats. She had recent falls as well with no severe injury  Both daughter and pt state that [facility] is seeing pt now -since the end of December per orders of her PCP Dr. Alfonso Ramus.  Pt made aware if increased WBC of 65k. Advised that Dr. Lorenso Courier will address this next week.  MD appt now scheduled for 03/16/21 @ 2pm. Pt and daughter aware.

## 2021-03-11 NOTE — Telephone Encounter (Signed)
Scheduled appointment per 1/20 scheduling message. Talked with patients daughter [name]). [name] is aware of the patients upcoming appointment.

## 2021-03-11 NOTE — Progress Notes (Unsigned)
[facility] OFFICE PROGRESS NOTE  [place] Maw, MD [place] [address]  DIAGNOSIS: ***  PRIOR THERAPY:  CURRENT THERAPY:  INTERVAL HISTORY: Sarah [name] 81 y.o. female returns to clinic today for a 33-monthfollow-up visit.       MEDICAL HISTORY: Past Medical History:  Diagnosis Date   Acute cystitis with hematuria 12/15/2019   [place]) [place] 05/06/2020   Allergic rhinitis due to pollen 12/08/2019   Allergy    Anxiety    Arthritis    CLL (chronic lymphocytic leukemia) ([place]    Coronary artery disease 06/09/2020   Dyspnea    continuos   Elevated cholesterol 12/08/2019   Elevated TSH 06/10/2020   Fatigue 06/10/2020   GERD (gastroesophageal reflux disease)    Gout    History of gout 12/08/2019   History of heart attack    History of MI (myocardial infarction) 12/08/2019   History of myocardial infarction 12/08/2019   HLD (hyperlipidemia)    Hospital discharge follow-up 05/25/2020   Hypertension    Hypokalemia 05/25/2020   Hypotension 05/06/2020   Macrocytic anemia 12/15/2019   Non Hodgkin's lymphoma ([place]    Other general symptoms and signs  06/10/2020   Urinary frequency 12/08/2019   UTI (urinary tract infection) 05/06/2020    ALLERGIES:  is allergic to amoxicillin, augmentin [amoxicillin-pot clavulanate], drug [tape], sulfa antibiotics, atenolol, and macrodantin [nitrofurantoin].  MEDICATIONS:  Current Outpatient Medications  Medication Sig Dispense Refill   busPIRone (BUSPAR) 15 MG tablet TAKE 1 TABLET BY MOUTH 2 TIMES DAILY. 180 tablet 1   ciprofloxacin (CIPRO) 500 MG tablet Take 500 mg by mouth 2 (two) times daily. (Patient not taking: Reported on 11/22/2020)     fluticasone (FLONASE) 50 MCG/ACT nasal spray Place 2 sprays into both nostrils daily. (Patient taking differently: Place 2 sprays into both nostrils as needed for allergies.) 16 g 6   guaifenesin (HUMIBID E) 400 MG TABS tablet Take 400 mg  by mouth every 6 (six) hours as needed (allergies/ mucus).     HYDROcodone-acetaminophen (NORCO) 5-325 MG tablet Take 1 tablet by mouth every 6 (six) hours as needed for moderate pain. 60 tablet 0   ipratropium (ATROVENT) 0.03 % nasal spray Place 2 sprays into both nostrils 2 (two) times daily. 30 mL 0   lansoprazole (PREVACID) 30 MG capsule TAKE 1 CAPSULE BY MOUTH EVERY DAY 30 capsule 14   levothyroxine (SYNTHROID) 50 MCG tablet Take 1 tablet (50 mcg total) by mouth daily. 30 minutes prior to eating. 90 tablet 0   loratadine (CLARITIN) 10 MG tablet Take 10 mg by mouth daily as needed for allergies.     Multiple Vitamins-Minerals (EQ MULTIVITAMINS ADULT GUMMY) CHEW Chew 1 tablet by mouth in the morning. Unknown strength     Polyethyl Glycol-Propyl Glycol (SYSTANE) 0.4-0.3 % SOLN Apply 1 drop to eye as needed. (Patient taking differently: Apply 1 drop to eye as needed (dryness).) 10 mL 0   potassium chloride (MICRO-K) 10 MEQ CR capsule TAKE 1 CAPSULE BY MOUTH EVERY DAY (Patient taking differently: Take 10 mEq by mouth daily.) 90 capsule 0   saccharomyces boulardii (FLORASTOR) 250 MG capsule Take 30 mg by mouth daily.     trimethoprim (TRIMPEX) 100 MG tablet Take 100 mg by mouth daily.     venlafaxine XR (EFFEXOR-XR) 150 MG 24 hr capsule TAKE 1 CAPSULE BY MOUTH EVERY DAY FOR MOOD (Patient taking differently: Take 150 mg by mouth daily with breakfast.) 90 capsule  3   No current facility-administered medications for this visit.    SURGICAL HISTORY:  Past Surgical History:  Procedure Laterality Date   ABDOMINAL HYSTERECTOMY     APPENDECTOMY     CHOLECYSTECTOMY     CYSTOSCOPY WITH STENT PLACEMENT Left 05/12/2020   Procedure: CYSTOSCOPY WITH STENT PLACEMENT;  Surgeon: Lucas Mallow, MD;  Location: [facility];  Service: Urology;  Laterality: Left;   CYSTOSCOPY/URETEROSCOPY/HOLMIUM LASER/STENT PLACEMENT Left 06/23/2020   Procedure: CYSTOSCOPY LEFT diagnostic /URETEROSCOPy /STENT  EXCHANGE ureteral  BALLOON DILATION;  Surgeon: Lucas Mallow, MD;  Location[facility];  Service: Urology;  Laterality: Left;  REQUESTING 1 HR   Double masectomy     KIDNEY SURGERY     MOHS SURGERY     TONSILECTOMY, ADENOIDECTOMY, BILATERAL MYRINGOTOMY AND TUBES      REVIEW OF SYSTEMS:   Review of Systems  Constitutional: Negative for appetite change, chills, fatigue, fever and unexpected weight change.  HENT:   Negative for mouth sores, nosebleeds, sore throat and trouble swallowing.   Eyes: Negative for eye problems and icterus.  Respiratory: Negative for cough, hemoptysis, shortness of breath and wheezing.   Cardiovascular: Negative for chest pain and leg swelling.  Gastrointestinal: Negative for abdominal pain, constipation, diarrhea, nausea and vomiting.  Genitourinary: Negative for bladder incontinence, difficulty urinating, dysuria, frequency and hematuria.   Musculoskeletal: Negative for back pain, gait problem, neck pain and neck stiffness.  Skin: Negative for itching and rash.  Neurological: Negative for dizziness, extremity weakness, gait problem, headaches, light-headedness and seizures.  Hematological: Negative for adenopathy. Does not bruise/bleed easily.  Psychiatric/Behavioral: Negative for confusion, depression and sleep disturbance. The patient is not nervous/anxious.     PHYSICAL EXAMINATION:  There were no vitals taken for this visit.  ECOG PERFORMANCE STATUS: {CHL ONC ECOG [id number]  Physical Exam  Constitutional: Oriented to person, place, and time and well-developed, well-nourished, and in no distress. No distress.  HENT:  Head: Normocephalic and atraumatic.  Mouth/Throat: Oropharynx is clear and moist. No oropharyngeal exudate.  Eyes: Conjunctivae are normal. Right eye exhibits no discharge. Left eye exhibits no discharge. No scleral icterus.  Neck: Normal range of motion. Neck supple.  Cardiovascular: Normal rate, regular rhythm, normal heart sounds and intact distal  pulses.   Pulmonary/Chest: Effort normal and breath sounds normal. No respiratory distress. No wheezes. No rales.  Abdominal: Soft. Bowel sounds are normal. Exhibits no distension and no mass. There is no tenderness.  Musculoskeletal: Normal range of motion. Exhibits no edema.  Lymphadenopathy:    No cervical adenopathy.  Neurological: Alert and oriented to person, place, and time. Exhibits normal muscle tone. Gait normal. Coordination normal.  Skin: Skin is warm and dry. No rash noted. Not diaphoretic. No erythema. No pallor.  Psychiatric: Mood, memory and judgment normal.  Vitals reviewed.  LABORATORY DATA: Lab Results  Component Value Date   WBC 39.1 (H) 10/06/2020   HGB 9.0 (L) 10/06/2020   HCT 29.3 (L) 10/06/2020   MCV 103.9 (H) 10/06/2020   PLT 228 10/06/2020      Chemistry      Component Value Date/Time   NA 141 10/06/2020 0538   NA 141 09/29/2020 1415   K 4.2 10/06/2020 0538   CL 106 10/06/2020 0538   CO2 26 10/06/2020 0538   BUN 24 (H) 10/06/2020 0538   BUN 24 09/29/2020 1415   CREATININE 0.80 10/06/2020 0538   CREATININE 1.30 (H) 08/25/2020 1109      Component Value Date/Time  CALCIUM 8.7 (L) 10/06/2020 0538   ALKPHOS 110 10/04/2020 0703   AST 24 10/04/2020 0703   AST 18 08/25/2020 1109   ALT 15 10/04/2020 0703   ALT 12 08/25/2020 1109   BILITOT 0.4 10/04/2020 0703   BILITOT 0.3 09/29/2020 1415   BILITOT 0.5 08/25/2020 1109       RADIOGRAPHIC STUDIES:  No results found.   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 No treatment pursued at that time.  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  4) 06/14/15-received R-Benda, but d/c due to poor tolerance 5) 05/06/18-06/26/2018: ibrutinib therapy, d/c due to poor tolerance 6) 09/02/2019: last visit with  Dr. Phillip Heal at the [facility] clinic in [place], [place].  6) 10/03/2019: establish care with Dr. 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    I spent {CHL ONC TIME VISIT - LKGMW:[id number]} counseling the patient face to face. The total time spent in the appointment was {CHL ONC TIME VISIT - QIHKV:[id number]}.  Herrick Hartog L Kelii Chittum, PA-C 03/11/21

## 2021-03-16 ENCOUNTER — Inpatient Hospital Stay (HOSPITAL_BASED_OUTPATIENT_CLINIC_OR_DEPARTMENT_OTHER): Payer: Medicare PPO | Admitting: Hematology and Oncology

## 2021-03-16 ENCOUNTER — Other Ambulatory Visit: Payer: Self-pay

## 2021-03-16 VITALS — BP 100/59 | Temp 97.3°F | Resp 17 | Wt 151.1 lb

## 2021-03-16 DIAGNOSIS — C9110 Chronic lymphocytic leukemia of B-cell type not having achieved remission: Secondary | ICD-10-CM | POA: Diagnosis not present

## 2021-03-16 DIAGNOSIS — Z0000 Encounter for general adult medical examination without abnormal findings: Secondary | ICD-10-CM

## 2021-03-22 ENCOUNTER — Telehealth: Payer: Self-pay | Admitting: *Deleted

## 2021-03-22 NOTE — Progress Notes (Signed)
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 No treatment pursued at that time.  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  4) 06/14/15-received R-Benda, but d/c due to poor tolerance 5) 05/06/18-06/26/2018: ibrutinib therapy, d/c due to poor tolerance 6) 09/02/2019: last visit with Dr. Phillip Heal at the [facility] clinic in [place], [place].  6) 10/03/2019: establish care with Dr. 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 female with medical history significant for CLL and breast cancer in remission who presents for a follow up visit. The patient's last visit was on 08/25/2020. In the interim since the last visit Mrs. Snowball has connected with a hospice agency.  On exam today Mrs. Castillo is accompanied by her daughter.  She reports she is recently establish care with a hospice agency.  She notes that they have helped her require durable medical equipment in order to help make her life easier.  She was placed on hospice by her primary care provider.  She notes that she continues to have low levels of energy and so-so appetite.  She denies any new or evident lymphadenopathy or abdominal distention.  She otherwise denies having any issues today with fevers, chills, sweats, nausea, vomiting or diarrhea.  Her weight has been stable.  A full 10 point ROS is listed  below.  MEDICAL HISTORY:  Past Medical History:  Diagnosis Date   Acute cystitis with hematuria 12/15/2019   AKI (acute kidney injury) ([facility]) 05/06/2020   Allergic rhinitis due to pollen 12/08/2019   Allergy    Anxiety    Arthritis    CLL (chronic lymphocytic leukemia) (H[facility])    Coronary artery disease 06/09/2020   Dyspnea    continuos   Elevated cholesterol 12/08/2019   Elevated TSH 06/10/2020   Fatigue 06/10/2020   GERD (gastroesophageal reflux disease)    Gout    History of gout 12/08/2019   History of heart attack    History of MI (myocardial infarction) 12/08/2019   History of myocardial infarction 12/08/2019   HLD (hyperlipidemia)    Hospital discharge follow-up 05/25/2020   Hypertension    Hypokalemia 05/25/2020   Hypotension 05/06/2020   Macrocytic anemia 12/15/2019   Non Hodgkin's lymphoma ([facility])    Other general symptoms and signs  06/10/2020   Urinary frequency 12/08/2019   UTI (urinary tract infection) 05/06/2020    SURGICAL HISTORY: Past Surgical History:  Procedure Laterality Date   ABDOMINAL HYSTERECTOMY     APPENDECTOMY     CHOLECYSTECTOMY     CYSTOSCOPY WITH STENT PLACEMENT Left 05/12/2020   Procedure: CYSTOSCOPY WITH STENT PLACEMENT;  Surgeon: Lucas Mallow, MD;  Location: [facility];  Service: Urology;  Laterality: Left;   CYSTOSCOPY/URETEROSCOPY/HOLMIUM LASER/STENT PLACEMENT Left 06/23/2020   Procedure: CYSTOSCOPY LEFT diagnostic /URETEROSCOPy /STENT  EXCHANGE ureteral BALLOON DILATION;  Surgeon: Lucas Mallow, MD;  Location: [facility];  Service: Urology;  Laterality:  Left;  REQUESTING 1 HR   Double masectomy     KIDNEY SURGERY     MOHS SURGERY     TONSILECTOMY, ADENOIDECTOMY, BILATERAL MYRINGOTOMY AND TUBES      SOCIAL HISTORY: Social History   Socioeconomic History   Marital status: Widowed    Spouse name: Not on file   Number of children: Not on file   Years of education: Not on file   Highest education level: Not on file  Occupational History    Not on file  Tobacco Use   Smoking status: Never   Smokeless tobacco: Never  Vaping Use   Vaping Use: Never used  Substance and Sexual Activity   Alcohol use: Not Currently    Comment: Rare social drinking   Drug use: Never   Sexual activity: Not Currently  Other Topics Concern   Not on file  Social History Narrative   Not on file   Social Determinants of Health   Financial Resource Strain: Not on file  Food Insecurity: Not on file  Transportation Needs: Not on file  Physical Activity: Not on file  Stress: Not on file  Social Connections: Not on file  Intimate Partner Violence: Not on file    FAMILY HISTORY: Family History  Problem Relation Age of Onset   Colon cancer Mother    Heart attack Father     ALLERGIES:  is allergic to amoxicillin, augmentin [amoxicillin-pot clavulanate], drug [tape], sulfa antibiotics, atenolol, and macrodantin [nitrofurantoin].  MEDICATIONS:  Current Outpatient Medications  Medication Sig Dispense Refill   busPIRone (BUSPAR) 15 MG tablet TAKE 1 TABLET BY MOUTH 2 TIMES DAILY. 180 tablet 1   ciprofloxacin (CIPRO) 500 MG tablet Take 500 mg by mouth 2 (two) times daily. (Patient not taking: Reported on 11/22/2020)     fluticasone (FLONASE) 50 MCG/ACT nasal spray Place 2 sprays into both nostrils daily. (Patient taking differently: Place 2 sprays into both nostrils as needed for allergies.) 16 g 6   guaifenesin (HUMIBID E) 400 MG TABS tablet Take 400 mg by mouth every 6 (six) hours as needed (allergies/ mucus).     HYDROcodone-acetaminophen (NORCO) 5-325 MG tablet Take 1 tablet by mouth every 6 (six) hours as needed for moderate pain. 60 tablet 0   ipratropium (ATROVENT) 0.03 % nasal spray Place 2 sprays into both nostrils 2 (two) times daily. 30 mL 0   lansoprazole (PREVACID) 30 MG capsule TAKE 1 CAPSULE BY MOUTH EVERY DAY 30 capsule 14   levothyroxine (SYNTHROID) 50 MCG tablet Take 1 tablet (50 mcg total) by mouth daily. 30 minutes prior to  eating. 90 tablet 0   loratadine (CLARITIN) 10 MG tablet Take 10 mg by mouth daily as needed for allergies.     Multiple Vitamins-Minerals (EQ MULTIVITAMINS ADULT GUMMY) CHEW Chew 1 tablet by mouth in the morning. Unknown strength     Polyethyl Glycol-Propyl Glycol (SYSTANE) 0.4-0.3 % SOLN Apply 1 drop to eye as needed. (Patient taking differently: Apply 1 drop to eye as needed (dryness).) 10 mL 0   potassium chloride (MICRO-K) 10 MEQ CR capsule TAKE 1 CAPSULE BY MOUTH EVERY DAY (Patient taking differently: Take 10 mEq by mouth daily.) 90 capsule 0   saccharomyces boulardii (FLORASTOR) 250 MG capsule Take 30 mg by mouth daily.     trimethoprim (TRIMPEX) 100 MG tablet Take 100 mg by mouth daily.     venlafaxine XR (EFFEXOR-XR) 150 MG 24 hr capsule TAKE 1 CAPSULE BY MOUTH EVERY DAY FOR MOOD (Patient taking differently:  Take 150 mg by mouth daily with breakfast.) 90 capsule 3   No current facility-administered medications for this visit.    REVIEW OF SYSTEMS:   Constitutional: ( - ) fevers, ( - )  chills , ( - ) night sweats Eyes: ( - ) blurriness of vision, ( - ) double vision, ( - ) watery eyes Ears, nose, mouth, throat, and face: ( - ) mucositis, ( - ) sore throat Respiratory: ( - ) cough, ( - ) dyspnea, ( - ) wheezes Cardiovascular: ( - ) palpitation, ( - ) chest discomfort, ( - ) lower extremity swelling Gastrointestinal:  ( - ) nausea, ( - ) heartburn, ( - ) change in bowel habits Skin: ( - ) abnormal skin rashes Lymphatics: ( - ) new lymphadenopathy, ( - ) easy bruising Neurological: ( - ) numbness, ( - ) tingling, ( - ) new weaknesses Behavioral/Psych: ( - ) mood change, ( - ) new changes  All other systems were reviewed with the patient and are negative.  PHYSICAL EXAMINATION: ECOG PERFORMANCE STATUS: 3 - Symptomatic, >50% confined to bed  Vitals:   03/16/21 1414  BP: (!) 100/59  Resp: 17  Temp: (!) 97.3 F (36.3 C)   Filed Weights   03/16/21 1414  Weight: 151 lb 2 oz (68.5  kg)    GENERAL: chronically ill appearing elderly Caucasian female in NAD  SKIN: skin color, texture, turgor are normal, no rashes or significant lesions EYES: conjunctiva are pink and non-injected, sclera clear LUNGS: clear to auscultation and percussion with normal breathing effort HEART: regular rate & rhythm and no murmurs and no lower extremity edema ABDOMEN: soft, non-tender, non-distended, normal bowel sounds Musculoskeletal: no cyanosis of digits and no clubbing  PSYCH: alert & oriented x 3, fluent speech NEURO: no focal motor/sensory deficits  LABORATORY DATA:  I have reviewed the data as listed CBC Latest Ref Rng & Units 03/11/2021 10/06/2020 10/05/2020  WBC 4.0 - 10.5 K/uL 65.1(HH) 39.1(H) 34.0(H)  Hemoglobin 12.0 - 15.0 g/dL 9.7(L) 9.0(L) 8.2(L)  Hematocrit 36.0 - 46.0 % 32.6(L) 29.3(L) 27.2(L)  Platelets 150 - 400 K/uL 229 228 205    CMP Latest Ref Rng & Units 03/11/2021 10/06/2020 10/05/2020  Glucose 70 - 99 mg/dL 132(H) 103(H) 149(H)  BUN 8 - 23 mg/dL 27(H) 24(H) 28(H)  Creatinine 0.44 - 1.00 mg/dL 1.27(H) 0.80 0.89  Sodium 135 - 145 mmol/L 139 141 139  Potassium 3.5 - 5.1 mmol/L 4.4 4.2 4.0  Chloride 98 - 111 mmol/L 108 106 107  CO2 22 - 32 mmol/L 20(L) 26 24  Calcium 8.9 - 10.3 mg/dL 8.7(L) 8.7(L) 8.5(L)  Total Protein 6.5 - 8.1 g/dL 5.6(L) - -  Total Bilirubin 0.3 - 1.2 mg/dL 0.5 - -  Alkaline Phos 38 - 126 U/L 127(H) - -  AST 15 - 41 U/L 19 - -  ALT 0 - 44 U/L 11 - -    RADIOGRAPHIC STUDIES: No results found.   ASSESSMENT & PLAN Sarah [name] 81 y.o. female with medical history significant for CLL and breast cancer in remission who presents for a follow up visit.  After review of the labs, the records, and discussion with the patient the findings are most consistent with a diagnosis of CLL that does not currently require treatment.  She has no palpable lymphadenopathy and no evidence of anemia and thrombocytopenia.  As such I do believe would be appropriate to  continue observation at this time.  She has been attempted on  therapy as before with ibrutinib and rituximab bendamustine but is not been able to tolerate these treatments.  As such I do believe that observation alone with discussion of comfort based care if the patient were to have progression.  We will plan to see the patient back in approximately 6 months time in order to reassess.  #Chronic Lymphocytic Leukemia --based on prior labs and studies the patient has a confirmed diagnosis of CLL.  --will order CBC, CMP, LDH today --patient has attempted prior therapy with ibrutinib and R-Benda but not been able to tolerate thearpy.  --patient recently established care with hospice service.  --CT scan from 05/08/20 shows lymphadenopathy and splenomegaly. Patient does not have stably low anemia/thrombocytopenia on labs (appears to have had a transient recovering anemia while in house with UTI).  --RTC in 6 months or sooner if new issues arise. Interval lab visit in 3 months time.    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 --continue yearly mammograms    No orders of the defined types were placed in this encounter.  All questions were answered. The patient knows to call the clinic with any problems, questions or concerns.  A total of more than 30 minutes were spent on this encounter and over half of that time was spent on counseling and coordination of care as outlined above.   Ledell Peoples, MD Department of Hematology/Oncology [place] at [facility] Phone: [phone] Pager: [phone] Email: [email or web address]  03/22/2021 12:27 PM

## 2021-03-22 NOTE — Telephone Encounter (Signed)
[facility] states patient is experiencing " Heavy drenching sweats". She forgot to mention to Dr Lorenso Courier. They understand that this is part of the dying process, but was wondering if anything can help. Please advise patient.

## 2021-03-23 ENCOUNTER — Telehealth: Payer: Self-pay | Admitting: *Deleted

## 2021-03-23 NOTE — Telephone Encounter (Signed)
Notified daughter that there are not any treatments for night sweats like Sarah [name] is having

## 2021-03-30 ENCOUNTER — Telehealth: Payer: Self-pay | Admitting: Hematology and Oncology

## 2021-03-30 NOTE — Telephone Encounter (Signed)
Sch per 1/31 inbasket, pt and daughter aware

## 2021-04-12 ENCOUNTER — Other Ambulatory Visit: Payer: Self-pay | Admitting: Family Medicine

## 2021-04-12 DIAGNOSIS — R7989 Other specified abnormal findings of blood chemistry: Secondary | ICD-10-CM

## 2021-04-12 DIAGNOSIS — E039 Hypothyroidism, unspecified: Secondary | ICD-10-CM

## 2021-04-12 DIAGNOSIS — R5383 Other fatigue: Secondary | ICD-10-CM

## 2021-06-14 ENCOUNTER — Inpatient Hospital Stay: Payer: Medicare PPO

## 2021-06-15 ENCOUNTER — Other Ambulatory Visit: Payer: Medicare PPO

## 2021-08-14 ENCOUNTER — Other Ambulatory Visit: Payer: Self-pay | Admitting: Family Medicine

## 2021-08-14 IMAGING — RF DG RETROGRADE PYELOGRAM
1 series · 2 of 2 positions shown · non-contrast
Comparison: CT 05/08/2020

CLINICAL DATA: 79-year-old female with a history of left-sided
hydronephrosis with history of prior pyloroplasty

EXAM:
RETROGRADE PYELOGRAM

[Series 1: run · 2 of 2 slices shown]
[im 1/2]
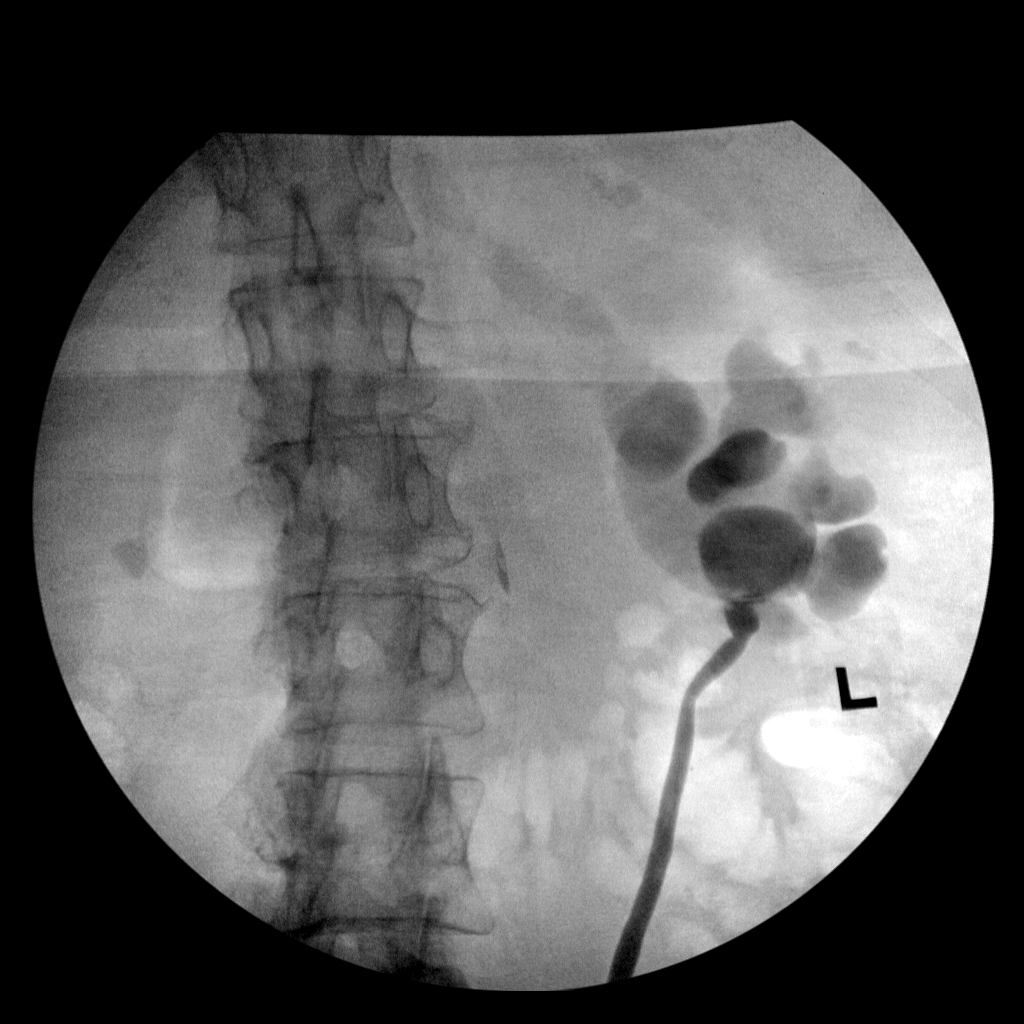
[im 2/2]
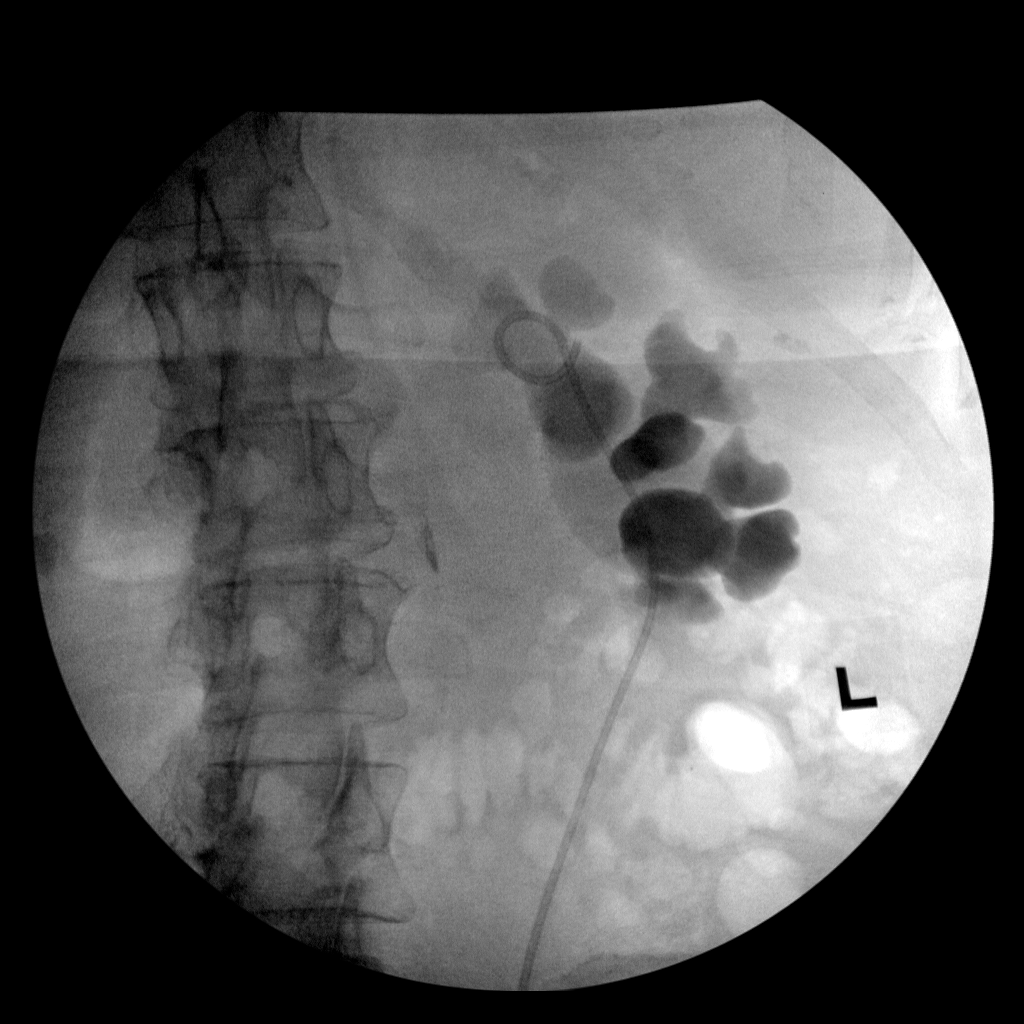

[2 of 2 positions shown; findings below may reference images not displayed]

FINDINGS: Limited fluoroscopic images during retrograde pyelogram.

Images demonstrate partial opacification of the left-sided renal
pelvis and collecting system with pelvicaliectasis and
hydronephrosis. The visualized ureter is of relatively normal
caliber.

Final image demonstrates placement of a double-J ureteral stent
IMPRESSION: Limited fluoroscopic spot images during retrograde pyelogram
demonstrates treatment of the left collecting system with placement
of a double-J stent. Please refer to the dictated operative report
for full details of intraoperative findings and procedure.

## 2021-08-15 ENCOUNTER — Other Ambulatory Visit: Payer: Self-pay | Admitting: Family Medicine

## 2021-08-15 LAB — TSH: TSH: 2.5 (ref 0.41–5.90)

## 2021-08-18 ENCOUNTER — Encounter: Payer: Self-pay | Admitting: Family Medicine

## 2021-09-10 ENCOUNTER — Other Ambulatory Visit: Payer: Self-pay | Admitting: Family Medicine

## 2021-09-15 ENCOUNTER — Ambulatory Visit: Payer: Medicare PPO | Admitting: Hematology and Oncology

## 2021-09-15 ENCOUNTER — Telehealth: Payer: Self-pay | Admitting: Family Medicine

## 2021-09-15 ENCOUNTER — Other Ambulatory Visit: Payer: Medicare PPO

## 2021-09-15 NOTE — Telephone Encounter (Signed)
Left message for patient's daughter, [name],  to call back and schedule Medicare Annual Wellness Visit (AWV).   Please offer to do virtually or by telephone.  Left office number and my jabber [phone].  Last AWV:12/30/2019  Please schedule at anytime with Nurse Health Advisor.

## 2021-10-07 ENCOUNTER — Other Ambulatory Visit: Payer: Self-pay | Admitting: Family Medicine

## 2021-10-07 DIAGNOSIS — R5383 Other fatigue: Secondary | ICD-10-CM

## 2021-10-07 DIAGNOSIS — E039 Hypothyroidism, unspecified: Secondary | ICD-10-CM

## 2021-10-07 DIAGNOSIS — R7989 Other specified abnormal findings of blood chemistry: Secondary | ICD-10-CM

## 2021-10-11 ENCOUNTER — Telehealth: Payer: Self-pay

## 2021-10-11 NOTE — Telephone Encounter (Signed)
Called patient x 3 on all available number , no answer. Patient may reschedule for the next available appointment .  L.Wilson,LPN

## 2021-10-12 NOTE — Telephone Encounter (Signed)
Not a patient at [facility]

## 2021-11-02 ENCOUNTER — Ambulatory Visit (INDEPENDENT_AMBULATORY_CARE_PROVIDER_SITE_OTHER): Payer: Medicare Other

## 2021-11-02 VITALS — Ht 63.0 in | Wt 151.0 lb

## 2021-11-02 DIAGNOSIS — Z0000 Encounter for general adult medical examination without abnormal findings: Secondary | ICD-10-CM | POA: Diagnosis not present

## 2021-11-02 NOTE — Patient Instructions (Signed)
Sarah [name] , Thank you for taking time to come for your Medicare Wellness Visit. I appreciate your ongoing commitment to your health goals. Please review the following plan we discussed and let me know if I can assist you in the future.   Screening recommendations/referrals: Colonoscopy: no longer required  Mammogram: no longer required  Bone Density: declined patient unable to travel  Recommended yearly ophthalmology/optometry visit for glaucoma screening and checkup Recommended yearly dental visit for hygiene and checkup  Vaccinations: Influenza vaccine: due in the fall  Pneumococcal vaccine: due  Tdap vaccine: due  Shingles vaccine: will consider    Covid-19:due   Advanced directives: Please bring a copy of your health care power of attorney and living will to the office to be added to your chart at your convenience.   Conditions/risks identified: Aim for 30 minutes of exercise or brisk walking, 6-8 glasses of water, and 5 servings of fruits and vegetables each day.   Next appointment: Follow up in one year for your annual wellness visit    Preventive Care 65 Years and Older, Female Preventive care refers to lifestyle choices and visits with your health care provider that can promote health and wellness. What does preventive care include? A yearly physical exam. This is also called an annual well check. Dental exams once or twice a year. Routine eye exams. Ask your health care provider how often you should have your eyes checked. Personal lifestyle choices, including: Daily care of your teeth and gums. Regular physical activity. Eating a healthy diet. Avoiding tobacco and drug use. Limiting alcohol use. Practicing safe sex. Taking low-dose aspirin every day. Taking vitamin and mineral supplements as recommended by your health care provider. What happens during an annual well check? The services and screenings done by your health care provider during your annual well check  will depend on your age, overall health, lifestyle risk factors, and family history of disease. Counseling  Your health care provider may ask you questions about your: Alcohol use. Tobacco use. Drug use. Emotional well-being. Home and relationship well-being. Sexual activity. Eating habits. History of falls. Memory and ability to understand (cognition). Work and work Statistician. Reproductive health. Screening  You may have the following tests or measurements: Height, weight, and BMI. Blood pressure. Lipid and cholesterol levels. These may be checked every 5 years, or more frequently if you are over 81 years old. Skin check. Lung cancer screening. You may have this screening every year starting at age 47 if you have a 30-pack-year history of smoking and currently smoke or have quit within the past 15 years. Fecal occult blood test (FOBT) of the stool. You may have this test every year starting at age 73. Flexible sigmoidoscopy or colonoscopy. You may have a sigmoidoscopy every 5 years or a colonoscopy every 10 years starting at age 56. Hepatitis C blood test. Hepatitis B blood test. Sexually transmitted disease (STD) testing. Diabetes screening. This is done by checking your blood sugar (glucose) after you have not eaten for a while (fasting). You may have this done every 1-3 years. Bone density scan. This is done to screen for osteoporosis. You may have this done starting at age 21. Mammogram. This may be done every 1-2 years. Talk to your health care provider about how often you should have regular mammograms. Talk with your health care provider about your test results, treatment options, and if necessary, the need for more tests. Vaccines  Your health care provider may recommend certain vaccines, such as:  Influenza vaccine. This is recommended every year. Tetanus, diphtheria, and acellular pertussis (Tdap, Td) vaccine. You may need a Td booster every 10 years. Zoster vaccine. You  may need this after age 88. Pneumococcal 13-valent conjugate (PCV13) vaccine. One dose is recommended after age 59. Pneumococcal polysaccharide (PPSV23) vaccine. One dose is recommended after age 23. Talk to your health care provider about which screenings and vaccines you need and how often you need them. This information is not intended to replace advice given to you by your health care provider. Make sure you discuss any questions you have with your health care provider. Document Released: 03/05/2015 Document Revised: 10/27/2015 Document Reviewed: 12/08/2014 Elsevier Interactive Patient Education  2017 [employer] Prevention in the Home Falls can cause injuries. They can happen to people of all ages. There are many things you can do to make your home safe and to help prevent falls. What can I do on the outside of my home? Regularly fix the edges of walkways and driveways and fix any cracks. Remove anything that might make you trip as you walk through a door, such as a raised step or threshold. Trim any bushes or trees on the path to your home. Use bright outdoor lighting. Clear any walking paths of anything that might make someone trip, such as rocks or tools. Regularly check to see if handrails are loose or broken. Make sure that both sides of any steps have handrails. Any raised decks and porches should have guardrails on the edges. Have any leaves, snow, or ice cleared regularly. Use sand or salt on walking paths during winter. Clean up any spills in your garage right away. This includes oil or grease spills. What can I do in the bathroom? Use night lights. Install grab bars by the toilet and in the tub and shower. Do not use towel bars as grab bars. Use non-skid mats or decals in the tub or shower. If you need to sit down in the shower, use a plastic, non-slip stool. Keep the floor dry. Clean up any water that spills on the floor as soon as it happens. Remove soap buildup  in the tub or shower regularly. Attach bath mats securely with double-sided non-slip rug tape. Do not have throw rugs and other things on the floor that can make you trip. What can I do in the bedroom? Use night lights. Make sure that you have a light by your bed that is easy to reach. Do not use any sheets or blankets that are too big for your bed. They should not hang down onto the floor. Have a firm chair that has side arms. You can use this for support while you get dressed. Do not have throw rugs and other things on the floor that can make you trip. What can I do in the kitchen? Clean up any spills right away. Avoid walking on wet floors. Keep items that you use a lot in easy-to-reach places. If you need to reach something above you, use a strong step stool that has a grab bar. Keep electrical cords out of the way. Do not use floor polish or wax that makes floors slippery. If you must use wax, use non-skid floor wax. Do not have throw rugs and other things on the floor that can make you trip. What can I do with my stairs? Do not leave any items on the stairs. Make sure that there are handrails on both sides of the stairs and use them.  Fix handrails that are broken or loose. Make sure that handrails are as long as the stairways. Check any carpeting to make sure that it is firmly attached to the stairs. Fix any carpet that is loose or worn. Avoid having throw rugs at the top or bottom of the stairs. If you do have throw rugs, attach them to the floor with carpet tape. Make sure that you have a light switch at the top of the stairs and the bottom of the stairs. If you do not have them, ask someone to add them for you. What else can I do to help prevent falls? Wear shoes that: Do not have high heels. Have rubber bottoms. Are comfortable and fit you well. Are closed at the toe. Do not wear sandals. If you use a stepladder: Make sure that it is fully opened. Do not climb a closed  stepladder. Make sure that both sides of the stepladder are locked into place. Ask someone to hold it for you, if possible. Clearly mark and make sure that you can see: Any grab bars or handrails. First and last steps. Where the edge of each step is. Use tools that help you move around (mobility aids) if they are needed. These include: Canes. Walkers. Scooters. Crutches. Turn on the lights when you go into a dark area. Replace any light bulbs as soon as they burn out. Set up your furniture so you have a clear path. Avoid moving your furniture around. If any of your floors are uneven, fix them. If there are any pets around you, be aware of where they are. Review your medicines with your doctor. Some medicines can make you feel dizzy. This can increase your chance of falling. Ask your doctor what other things that you can do to help prevent falls. This information is not intended to replace advice given to you by your health care provider. Make sure you discuss any questions you have with your health care provider. Document Released: 12/03/2008 Document Revised: 07/15/2015 Document Reviewed: 03/13/2014 Elsevier Interactive Patient Education  2017 [employer].

## 2021-11-02 NOTE — Progress Notes (Signed)
Subjective:   Sarah [name] is a 81 y.o. female who presents for Medicare Annual (Subsequent) preventive examination.   Virtual Visit via Telephone Note  I connected with  [name] on 11/02/21 at  2:30 PM EDT by telephone and verified that I am speaking with the correct person using two identifiers.  Location: Patient: home  Provider: [place]  Persons participating in the virtual visit: patient/Nurse Health Advisor   I discussed the limitations, risks, security and privacy concerns of performing an evaluation and management service by telephone and the availability of in person appointments. The patient expressed understanding and agreed to proceed.  Interactive audio and video telecommunications were attempted between this nurse and patient, however failed, due to patient having technical difficulties OR patient did not have access to video capability.  We continued and completed visit with audio only.  Some vital signs may be absent or patient reported.   [name], LPN  Review of Systems     Cardiac Risk Factors include: advanced age (>58mn, >>74women);dyslipidemia     Objective:    Today's Vitals   11/02/21 1434  Weight: 151 lb (68.5 kg)  Height: '5\' 3"'$  (1.6 m)   Body mass index is 26.75 kg/m.     11/02/2021    2:38 PM 10/03/2020    7:31 PM 06/23/2020   10:22 AM 06/14/2020   10:47 AM 05/17/2020    6:27 PM 05/17/2020    1:34 PM 05/12/2020   11:45 AM  Advanced Directives  Does Patient Have a Medical Advance Directive? Yes No Yes Yes Yes Yes No  Type of [place]of [place]  [place] Living will;Out of facility DNR (pink MOST or yellow form) Out of facility DNR (pink MOST or yellow form);Healthcare Power of Attorney   Does patient want to make changes to medical advance directive? No - Patient declined    No - Patient declined No - Patient declined    Copy of [place] Chart? No - copy requested  No - copy requested   Yes - validated most recent copy scanned in chart (See row information)   Would patient like information on creating a medical advance directive?  No - Patient declined   No - Patient declined No - Patient declined No - Patient declined  Pre-existing out of facility DNR order (yellow form or pink MOST form)      Yellow form placed in chart (order not valid for inpatient use)     Current Medications (verified) Outpatient Encounter Medications as of 11/02/2021  Medication Sig   busPIRone (BUSPAR) 15 MG tablet TAKE 1 TABLET BY MOUTH TWICE A DAY   fluticasone (FLONASE) 50 MCG/ACT nasal spray Place 2 sprays into both nostrils daily. (Patient taking differently: Place 2 sprays into both nostrils as needed for allergies.)   guaifenesin (HUMIBID E) 400 MG TABS tablet Take 400 mg by mouth every 6 (six) hours as needed (allergies/ mucus).   HYDROcodone-acetaminophen (NORCO) 5-325 MG tablet Take 1 tablet by mouth every 6 (six) hours as needed for moderate pain.   ipratropium (ATROVENT) 0.03 % nasal spray Place 2 sprays into both nostrils 2 (two) times daily.   lansoprazole (PREVACID) 30 MG capsule TAKE 1 CAPSULE BY MOUTH EVERY DAY   levothyroxine (SYNTHROID) 50 MCG tablet TAKE 1 TABLET (50 MCG TOTAL) BY MOUTH DAILY 30 MINUTES PRIOR TO EATING.   loratadine (CLARITIN) 10 MG tablet  Take 10 mg by mouth daily as needed for allergies.   Multiple Vitamins-Minerals (EQ MULTIVITAMINS ADULT GUMMY) CHEW Chew 1 tablet by mouth in the morning. Unknown strength   Polyethyl Glycol-Propyl Glycol (SYSTANE) 0.4-0.3 % SOLN Apply 1 drop to eye as needed. (Patient taking differently: Apply 1 drop to eye as needed (dryness).)   potassium chloride (MICRO-K) 10 MEQ CR capsule TAKE 1 CAPSULE BY MOUTH EVERY DAY (Patient taking differently: Take 10 mEq by mouth daily.)   saccharomyces boulardii (FLORASTOR) 250 MG capsule Take 30 mg by mouth  daily.   trimethoprim (TRIMPEX) 100 MG tablet Take 100 mg by mouth daily.   venlafaxine XR (EFFEXOR-XR) 150 MG 24 hr capsule Take 1 capsule (150 mg total) by mouth daily with breakfast.   ciprofloxacin (CIPRO) 500 MG tablet Take 500 mg by mouth 2 (two) times daily.   [DISCONTINUED] metoprolol tartrate (LOPRESSOR) 25 MG tablet Take 25 mg by mouth 2 (two) times daily.   No facility-administered encounter medications on file as of 11/02/2021.    Allergies (verified) Amoxicillin, Augmentin [amoxicillin-pot clavulanate], Drug [tape], Sulfa antibiotics, Atenolol, and Macrodantin [nitrofurantoin]   History: Past Medical History:  Diagnosis Date   Acute cystitis with hematuria 12/15/2019   AKI (acute kidney injury) ([place]) 05/06/2020   Allergic rhinitis due to pollen 12/08/2019   Allergy    Anxiety    Arthritis    CLL (chronic lymphocytic leukemia) (HCC)    Coronary artery disease 06/09/2020   Dyspnea    continuos   Elevated cholesterol 12/08/2019   Elevated TSH 06/10/2020   Fatigue 06/10/2020   GERD (gastroesophageal reflux disease)    Gout    History of gout 12/08/2019   History of heart attack    History of MI (myocardial infarction) 12/08/2019   History of myocardial infarction 12/08/2019   HLD (hyperlipidemia)    Hospital discharge follow-up 05/25/2020   Hypertension    Hypokalemia 05/25/2020   Hypotension 05/06/2020   Macrocytic anemia 12/15/2019   Non Hodgkin's lymphoma ([place])    Other general symptoms and signs  06/10/2020   Urinary frequency 12/08/2019   UTI (urinary tract infection) 05/06/2020   Past Surgical History:  Procedure Laterality Date   ABDOMINAL HYSTERECTOMY     APPENDECTOMY     CHOLECYSTECTOMY     CYSTOSCOPY WITH STENT PLACEMENT Left 05/12/2020   Procedure: CYSTOSCOPY WITH STENT PLACEMENT;  Surgeon: Lucas Mallow, MD;  Location: [facility];  Service: Urology;  Laterality: Left;   CYSTOSCOPY/URETEROSCOPY/HOLMIUM LASER/STENT PLACEMENT Left 06/23/2020   Procedure:  CYSTOSCOPY LEFT diagnostic /URETEROSCOPy /STENT  EXCHANGE ureteral BALLOON DILATION;  Surgeon: Lucas Mallow, MD;  Location: [facility];  Service: Urology;  Laterality: Left;  REQUESTING 1 HR   Double masectomy     KIDNEY SURGERY     MOHS SURGERY     TONSILECTOMY, ADENOIDECTOMY, BILATERAL MYRINGOTOMY AND TUBES     Family History  Problem Relation Age of Onset   Colon cancer Mother    Heart attack Father    Social History   Socioeconomic History   Marital status: Widowed    Spouse name: Not on file   Number of children: Not on file   Years of education: Not on file   Highest education level: Not on file  Occupational History   Not on file  Tobacco Use   Smoking status: Never   Smokeless tobacco: Never  Vaping Use   Vaping Use: Never used  Substance and Sexual Activity   Alcohol use: Not  Currently    Comment: Rare social drinking   Drug use: Never   Sexual activity: Not Currently  Other Topics Concern   Not on file  Social History Narrative   Not on file   Social Determinants of Health   Financial Resource Strain: Low Risk  (11/02/2021)   Overall Financial Resource Strain (CARDIA)    Difficulty of Paying Living Expenses: Not hard at all  Food Insecurity: No Food Insecurity (11/02/2021)   Hunger Vital Sign    Worried About Running Out of Food in the Last Year: Never true    Ran Out of Food in the Last Year: Never true  Transportation Needs: No Transportation Needs (11/02/2021)   PRAPARE - Hydrologist (Medical): No    Lack of Transportation (Non-Medical): No  Physical Activity: Insufficiently Active (11/02/2021)   Exercise Vital Sign    Days of Exercise per Week: 2 days    Minutes of Exercise per Session: 20 min  Stress: No Stress Concern Present (11/02/2021)   [place]    Feeling of Stress : Not at all  Social Connections: Socially Isolated (11/02/2021)   Social Connection  and Isolation Panel [NHANES]    Frequency of Communication with Friends and Family: More than three times a week    Frequency of Social Gatherings with Friends and Family: More than three times a week    Attends Religious Services: Never    Marine scientist or Organizations: No    Attends Archivist Meetings: Never    Marital Status: Widowed    Tobacco Counseling Counseling given: Not Answered   Clinical Intake:  Pre-visit preparation completed: Yes  Pain : No/denies pain     Nutritional Risks: None Diabetes: No  How often do you need to have someone help you when you read instructions, pamphlets, or other written materials from your doctor or pharmacy?: 1 - Never  Diabetic?no   Interpreter Needed?: No  Information entered by :: Jadene Pierini  LPN   Activities of Daily Living    11/02/2021    2:38 PM 11/02/2021    1:15 PM  In your present state of health, do you have any difficulty performing the following activities:  Hearing? 1   Vision? 1 0  Difficulty concentrating or making decisions? 1 1  Walking or climbing stairs? 1 1  Dressing or bathing? 1 1  Doing errands, shopping? 1 1  Preparing Food and eating ? Y Y  Using the Toilet? Y Y  In the past six months, have you accidently leaked urine? Y Y  Do you have problems with loss of bowel control? Y Y  Managing your Medications? Y Y  Managing your Finances? Tempie Donning  Housekeeping or managing your Housekeeping? Tempie Donning    Patient Care Team: Libby Maw, MD as PCP - General (Family Medicine) Park Liter, MD as PCP - Cardiology (Cardiology)  Indicate any recent Medical Services you may have received from other than [facility] providers in the past year (date may be approximate).     Assessment:   This is a routine wellness examination for [facility].  Hearing/Vision screen Vision Screening - Comments:: Due eye exam  CANNOT TRAVEL TO APPOINTMENT  Dietary issues and exercise activities  discussed: Current Exercise Habits: The patient does not participate in regular exercise at present;Home exercise routine, Type of exercise: strength training/weights, Time (Minutes): 20, Frequency (Times/Week): 2, Weekly Exercise (Minutes/Week):  40, Intensity: Mild, Exercise limited by: orthopedic condition(s)   Goals Addressed             This Visit's Progress    Patient Stated   On track    Drink more water & increase activity       Depression Screen    11/02/2021    2:37 PM 02/22/2021    1:57 PM 11/22/2020    4:05 PM 11/05/2020   11:49 AM 10/01/2020    4:24 PM 08/11/2020   10:55 AM 06/10/2020   11:07 AM  PHQ 2/9 Scores  PHQ - 2 Score 0 2 0 0 0 0 0    Fall Risk    11/02/2021    1:15 PM 10/11/2021   12:15 PM 02/22/2021    1:58 PM 11/22/2020    4:05 PM 11/05/2020   11:49 AM  [place] in the past year? 0 0 0 0 0  Number falls in past yr: 0 0  0   Injury with Fall?  0       FALL RISK PREVENTION PERTAINING TO THE HOME:  Any stairs in or around the home? No  If so, are there any without handrails? No  Home free of loose throw rugs in walkways, pet beds, electrical cords, etc? Yes  Adequate lighting in your home to reduce risk of falls? Yes   ASSISTIVE DEVICES UTILIZED TO PREVENT FALLS:  Life alert? No  Use of a cane, walker or w/c? No  Grab bars in the bathroom? Yes  Shower chair or bench in shower? Yes  Elevated toilet seat or a handicapped toilet? No          11/02/2021    2:39 PM 12/30/2019    2:44 PM  6CIT Screen  What Year? 0 points 0 points  What month? 0 points 0 points  What time? 0 points 0 points  Count back from 20 0 points 0 points  Months in reverse 0 points 0 points  Repeat phrase 2 points 0 points  Total Score 2 points 0 points    Immunizations Immunization History  Administered Date(s) Administered   Fluad Quad(high Dose 65+) 11/05/2020   Influenza-Unspecified 11/28/2019   PFIZER(Purple Top)SARS-COV-2 Vaccination 03/17/2019,  04/07/2019, 12/04/2019, 10/27/2020    TDAP status: Due, Education has been provided regarding the importance of this vaccine. Advised may receive this vaccine at local pharmacy or Health Dept. Aware to provide a copy of the vaccination record if obtained from local pharmacy or Health Dept. Verbalized acceptance and understanding.  Flu Vaccine status: Due, Education has been provided regarding the importance of this vaccine. Advised may receive this vaccine at local pharmacy or Health Dept. Aware to provide a copy of the vaccination record if obtained from local pharmacy or Health Dept. Verbalized acceptance and understanding.  Pneumococcal vaccine status: Due, Education has been provided regarding the importance of this vaccine. Advised may receive this vaccine at local pharmacy or Health Dept. Aware to provide a copy of the vaccination record if obtained from local pharmacy or Health Dept. Verbalized acceptance and understanding.  Covid-19 vaccine status: Completed vaccines  Qualifies for Shingles Vaccine? Yes   Zostavax completed No   Shingrix Completed?: No.    Education has been provided regarding the importance of this vaccine. Patient has been advised to call insurance company to determine out of pocket expense if they have not yet received this vaccine. Advised may also receive vaccine at local pharmacy or Health Dept. Verbalized  acceptance and understanding.  Screening Tests Health Maintenance  Topic Date Due   Pneumonia Vaccine 65+ Years old (1 - PCV) Never done   TETANUS/TDAP  Never done   Zoster Vaccines- Shingrix (1 of 2) Never done   DEXA SCAN  Never done   INFLUENZA VACCINE  09/20/2021   HPV VACCINES  Aged Out   COVID-19 Vaccine  Discontinued    Health Maintenance  Health Maintenance Due  Topic Date Due   Pneumonia Vaccine 26+ Years old (1 - PCV) Never done   TETANUS/TDAP  Never done   Zoster Vaccines- Shingrix (1 of 2) Never done   DEXA SCAN  Never done   INFLUENZA  VACCINE  09/20/2021    Colorectal cancer screening: No longer required.   Mammogram status: No longer required due to age .  Bone Density status: Ordered decline due to unable to travel . Pt provided with contact info and advised to call to schedule appt.  Lung Cancer Screening: (Low Dose CT Chest recommended if Age 92-80 years, 30 pack-year currently smoking OR have quit w/in 15years.) does not qualify.   Lung Cancer Screening Referral: n/a  Additional Screening:  Hepatitis C Screening: does not qualify;   Vision Screening: Recommended annual ophthalmology exams for early detection of glaucoma and other disorders of the eye. Is the patient up to date with their annual eye exam?  No  Who is the provider or what is the name of the office in which the patient attends annual eye exams? Unable to travel to appointment  If pt is not established with a provider, would they like to be referred to a provider to establish care? No .   Dental Screening: Recommended annual dental exams for proper oral hygiene  Community Resource Referral / Chronic Care Management: CRR required this visit?  No   CCM required this visit?  No      Plan:     I have personally reviewed and noted the following in the patient's chart:   Medical and social history Use of alcohol, tobacco or illicit drugs  Current medications and supplements including opioid prescriptions. Patient is not currently taking opioid prescriptions. Functional ability and status Nutritional status Physical activity Advanced directives List of other physicians Hospitalizations, surgeries, and ER visits in previous 12 months Vitals Screenings to include cognitive, depression, and falls Referrals and appointments  In addition, I have reviewed and discussed with patient certain preventive protocols, quality metrics, and best practice recommendations. A written personalized care plan for preventive services as well as general  preventive health recommendations were provided to patient.     [name], LPN   [id number]   Nurse Notes: Due eye exam, DEXA scan, TDAP , pneumonia , Flu vaccines

## 2021-12-19 ENCOUNTER — Telehealth: Payer: Self-pay | Admitting: Family Medicine

## 2021-12-19 NOTE — Telephone Encounter (Signed)
Please check [place] for potential death certificate sign off.

## 2021-12-21 DEATH — deceased

## 2023-01-31 ENCOUNTER — Other Ambulatory Visit (HOSPITAL_COMMUNITY): Payer: Self-pay

## 2023-07-13 ENCOUNTER — Other Ambulatory Visit (HOSPITAL_COMMUNITY): Payer: Self-pay
